# Patient Record
Sex: Female | Born: 1950
Health system: Southern US, Community
[De-identification: ages and names within clinical notes are randomized; demographics above are authoritative.]

## PROBLEM LIST (undated history)

## (undated) DIAGNOSIS — M8580 Other specified disorders of bone density and structure, unspecified site: Secondary | ICD-10-CM

## (undated) DIAGNOSIS — I4891 Unspecified atrial fibrillation: Secondary | ICD-10-CM

## (undated) DIAGNOSIS — N870 Mild cervical dysplasia: Secondary | ICD-10-CM

## (undated) DIAGNOSIS — Z9889 Other specified postprocedural states: Secondary | ICD-10-CM

## (undated) DIAGNOSIS — C4490 Unspecified malignant neoplasm of skin, unspecified: Secondary | ICD-10-CM

## (undated) DIAGNOSIS — M25511 Pain in right shoulder: Secondary | ICD-10-CM

## (undated) DIAGNOSIS — G43909 Migraine, unspecified, not intractable, without status migrainosus: Secondary | ICD-10-CM

## (undated) DIAGNOSIS — E785 Hyperlipidemia, unspecified: Secondary | ICD-10-CM

## (undated) DIAGNOSIS — R739 Hyperglycemia, unspecified: Secondary | ICD-10-CM

## (undated) DIAGNOSIS — M549 Dorsalgia, unspecified: Secondary | ICD-10-CM

## (undated) DIAGNOSIS — R112 Nausea with vomiting, unspecified: Secondary | ICD-10-CM

## (undated) DIAGNOSIS — I4892 Unspecified atrial flutter: Secondary | ICD-10-CM

## (undated) DIAGNOSIS — K635 Polyp of colon: Secondary | ICD-10-CM

## (undated) HISTORY — PX: CHOLECYSTECTOMY: SHX55

## (undated) HISTORY — PX: TUBAL LIGATION: SHX77

## (undated) HISTORY — PX: SHOULDER SURGERY: SHX246

## (undated) HISTORY — DX: Dorsalgia, unspecified: M54.9

## (undated) HISTORY — DX: Hyperglycemia, unspecified: R73.9

## (undated) HISTORY — PX: GYNECOLOGIC CRYOSURGERY: SHX857

## (undated) HISTORY — PX: COSMETIC SURGERY: SHX468

## (undated) HISTORY — DX: Pain in right shoulder: M25.511

## (undated) HISTORY — PX: COLPOSCOPY: SHX161

## (undated) HISTORY — DX: Hyperlipidemia, unspecified: E78.5

## (undated) HISTORY — PX: ABDOMINAL SURGERY: SHX537

## (undated) HISTORY — PX: TONSILLECTOMY AND ADENOIDECTOMY: SHX28

## (undated) HISTORY — DX: Other specified disorders of bone density and structure, unspecified site: M85.80

## (undated) HISTORY — DX: Unspecified malignant neoplasm of skin, unspecified: C44.90

## (undated) HISTORY — DX: Mild cervical dysplasia: N87.0

## (undated) HISTORY — DX: Polyp of colon: K63.5

## (undated) HISTORY — DX: Migraine, unspecified, not intractable, without status migrainosus: G43.909

---

## 1998-02-26 ENCOUNTER — Other Ambulatory Visit: Admission: RE | Admit: 1998-02-26 | Discharge: 1998-02-26 | Payer: Self-pay | Admitting: Obstetrics and Gynecology

## 1998-03-02 ENCOUNTER — Ambulatory Visit (HOSPITAL_COMMUNITY): Admission: RE | Admit: 1998-03-02 | Discharge: 1998-03-02 | Payer: Self-pay | Admitting: Obstetrics and Gynecology

## 1998-09-01 ENCOUNTER — Other Ambulatory Visit: Admission: RE | Admit: 1998-09-01 | Discharge: 1998-09-01 | Payer: Self-pay | Admitting: Obstetrics and Gynecology

## 1999-01-25 ENCOUNTER — Encounter: Payer: Self-pay | Admitting: Family Medicine

## 1999-01-25 ENCOUNTER — Ambulatory Visit (HOSPITAL_COMMUNITY): Admission: RE | Admit: 1999-01-25 | Discharge: 1999-01-25 | Payer: Self-pay | Admitting: Family Medicine

## 1999-04-01 ENCOUNTER — Encounter: Payer: Self-pay | Admitting: Obstetrics and Gynecology

## 1999-04-01 ENCOUNTER — Ambulatory Visit (HOSPITAL_COMMUNITY): Admission: RE | Admit: 1999-04-01 | Discharge: 1999-04-01 | Payer: Self-pay | Admitting: Obstetrics and Gynecology

## 1999-04-01 ENCOUNTER — Encounter (INDEPENDENT_AMBULATORY_CARE_PROVIDER_SITE_OTHER): Payer: Self-pay | Admitting: *Deleted

## 1999-04-01 ENCOUNTER — Other Ambulatory Visit: Admission: RE | Admit: 1999-04-01 | Discharge: 1999-04-01 | Payer: Self-pay | Admitting: Obstetrics and Gynecology

## 1999-06-01 ENCOUNTER — Other Ambulatory Visit: Admission: RE | Admit: 1999-06-01 | Discharge: 1999-06-01 | Payer: Self-pay | Admitting: Obstetrics and Gynecology

## 1999-06-01 ENCOUNTER — Encounter (INDEPENDENT_AMBULATORY_CARE_PROVIDER_SITE_OTHER): Payer: Self-pay | Admitting: Specialist

## 1999-07-08 ENCOUNTER — Encounter (INDEPENDENT_AMBULATORY_CARE_PROVIDER_SITE_OTHER): Payer: Self-pay

## 1999-07-08 ENCOUNTER — Other Ambulatory Visit: Admission: RE | Admit: 1999-07-08 | Discharge: 1999-07-08 | Payer: Self-pay | Admitting: Obstetrics and Gynecology

## 1999-10-08 ENCOUNTER — Other Ambulatory Visit: Admission: RE | Admit: 1999-10-08 | Discharge: 1999-10-08 | Payer: Self-pay | Admitting: Obstetrics and Gynecology

## 1999-12-23 ENCOUNTER — Other Ambulatory Visit: Admission: RE | Admit: 1999-12-23 | Discharge: 1999-12-23 | Payer: Self-pay | Admitting: Obstetrics and Gynecology

## 2000-02-04 ENCOUNTER — Encounter: Payer: Self-pay | Admitting: Obstetrics and Gynecology

## 2000-02-04 ENCOUNTER — Encounter: Admission: RE | Admit: 2000-02-04 | Discharge: 2000-02-04 | Payer: Self-pay | Admitting: Obstetrics and Gynecology

## 2000-04-10 ENCOUNTER — Other Ambulatory Visit: Admission: RE | Admit: 2000-04-10 | Discharge: 2000-04-10 | Payer: Self-pay | Admitting: Obstetrics and Gynecology

## 2000-10-17 HISTORY — PX: VAGINAL HYSTERECTOMY: SUR661

## 2000-10-17 HISTORY — PX: OOPHORECTOMY: SHX86

## 2001-02-14 ENCOUNTER — Encounter: Payer: Self-pay | Admitting: Obstetrics and Gynecology

## 2001-02-14 ENCOUNTER — Ambulatory Visit (HOSPITAL_COMMUNITY): Admission: RE | Admit: 2001-02-14 | Discharge: 2001-02-14 | Payer: Self-pay | Admitting: Obstetrics and Gynecology

## 2001-04-12 ENCOUNTER — Other Ambulatory Visit: Admission: RE | Admit: 2001-04-12 | Discharge: 2001-04-12 | Payer: Self-pay | Admitting: Obstetrics and Gynecology

## 2001-05-08 ENCOUNTER — Encounter (INDEPENDENT_AMBULATORY_CARE_PROVIDER_SITE_OTHER): Payer: Self-pay

## 2001-05-08 ENCOUNTER — Other Ambulatory Visit: Admission: RE | Admit: 2001-05-08 | Discharge: 2001-05-08 | Payer: Self-pay | Admitting: Obstetrics and Gynecology

## 2001-07-02 ENCOUNTER — Ambulatory Visit (HOSPITAL_COMMUNITY): Admission: RE | Admit: 2001-07-02 | Discharge: 2001-07-02 | Payer: Self-pay | Admitting: Obstetrics and Gynecology

## 2001-07-02 ENCOUNTER — Encounter: Payer: Self-pay | Admitting: Obstetrics and Gynecology

## 2001-07-27 ENCOUNTER — Other Ambulatory Visit: Admission: RE | Admit: 2001-07-27 | Discharge: 2001-07-27 | Payer: Self-pay | Admitting: Obstetrics and Gynecology

## 2001-09-17 ENCOUNTER — Inpatient Hospital Stay (HOSPITAL_COMMUNITY): Admission: RE | Admit: 2001-09-17 | Discharge: 2001-09-19 | Payer: Self-pay | Admitting: Obstetrics and Gynecology

## 2001-09-17 ENCOUNTER — Encounter (INDEPENDENT_AMBULATORY_CARE_PROVIDER_SITE_OTHER): Payer: Self-pay | Admitting: Specialist

## 2002-02-27 ENCOUNTER — Ambulatory Visit (HOSPITAL_COMMUNITY): Admission: RE | Admit: 2002-02-27 | Discharge: 2002-02-27 | Payer: Self-pay | Admitting: Obstetrics and Gynecology

## 2002-02-27 ENCOUNTER — Encounter: Payer: Self-pay | Admitting: Obstetrics and Gynecology

## 2002-08-15 ENCOUNTER — Other Ambulatory Visit: Admission: RE | Admit: 2002-08-15 | Discharge: 2002-08-15 | Payer: Self-pay | Admitting: Obstetrics and Gynecology

## 2003-03-05 ENCOUNTER — Ambulatory Visit (HOSPITAL_COMMUNITY): Admission: RE | Admit: 2003-03-05 | Discharge: 2003-03-05 | Payer: Self-pay | Admitting: Obstetrics and Gynecology

## 2003-03-05 ENCOUNTER — Encounter: Payer: Self-pay | Admitting: Obstetrics and Gynecology

## 2003-09-15 ENCOUNTER — Other Ambulatory Visit: Admission: RE | Admit: 2003-09-15 | Discharge: 2003-09-15 | Payer: Self-pay | Admitting: Obstetrics and Gynecology

## 2003-10-18 DIAGNOSIS — N870 Mild cervical dysplasia: Secondary | ICD-10-CM

## 2003-10-18 HISTORY — DX: Mild cervical dysplasia: N87.0

## 2004-03-05 ENCOUNTER — Ambulatory Visit (HOSPITAL_COMMUNITY): Admission: RE | Admit: 2004-03-05 | Discharge: 2004-03-05 | Payer: Self-pay | Admitting: Obstetrics and Gynecology

## 2004-04-29 ENCOUNTER — Ambulatory Visit (HOSPITAL_COMMUNITY): Admission: RE | Admit: 2004-04-29 | Discharge: 2004-04-29 | Payer: Self-pay | Admitting: Orthopedic Surgery

## 2004-09-21 ENCOUNTER — Other Ambulatory Visit: Admission: RE | Admit: 2004-09-21 | Discharge: 2004-09-21 | Payer: Self-pay | Admitting: Obstetrics and Gynecology

## 2005-03-08 ENCOUNTER — Ambulatory Visit (HOSPITAL_COMMUNITY): Admission: RE | Admit: 2005-03-08 | Discharge: 2005-03-08 | Payer: Self-pay | Admitting: Addiction Medicine

## 2005-03-10 ENCOUNTER — Other Ambulatory Visit: Admission: RE | Admit: 2005-03-10 | Discharge: 2005-03-10 | Payer: Self-pay | Admitting: Obstetrics and Gynecology

## 2005-09-22 ENCOUNTER — Other Ambulatory Visit: Admission: RE | Admit: 2005-09-22 | Discharge: 2005-09-22 | Payer: Self-pay | Admitting: Obstetrics and Gynecology

## 2006-03-16 ENCOUNTER — Ambulatory Visit (HOSPITAL_COMMUNITY): Admission: RE | Admit: 2006-03-16 | Discharge: 2006-03-16 | Payer: Self-pay | Admitting: Obstetrics and Gynecology

## 2006-03-28 ENCOUNTER — Other Ambulatory Visit: Admission: RE | Admit: 2006-03-28 | Discharge: 2006-03-28 | Payer: Self-pay | Admitting: Obstetrics and Gynecology

## 2006-10-04 ENCOUNTER — Other Ambulatory Visit: Admission: RE | Admit: 2006-10-04 | Discharge: 2006-10-04 | Payer: Self-pay | Admitting: Obstetrics and Gynecology

## 2007-03-19 ENCOUNTER — Ambulatory Visit (HOSPITAL_COMMUNITY): Admission: RE | Admit: 2007-03-19 | Discharge: 2007-03-19 | Payer: Self-pay | Admitting: Obstetrics and Gynecology

## 2007-03-28 ENCOUNTER — Encounter: Admission: RE | Admit: 2007-03-28 | Discharge: 2007-03-28 | Payer: Self-pay | Admitting: Obstetrics and Gynecology

## 2007-09-27 ENCOUNTER — Encounter: Admission: RE | Admit: 2007-09-27 | Discharge: 2007-09-27 | Payer: Self-pay | Admitting: Obstetrics and Gynecology

## 2007-10-12 ENCOUNTER — Other Ambulatory Visit: Admission: RE | Admit: 2007-10-12 | Discharge: 2007-10-12 | Payer: Self-pay | Admitting: Obstetrics and Gynecology

## 2008-03-28 ENCOUNTER — Encounter: Admission: RE | Admit: 2008-03-28 | Discharge: 2008-03-28 | Payer: Self-pay | Admitting: Obstetrics and Gynecology

## 2008-04-26 ENCOUNTER — Emergency Department (HOSPITAL_COMMUNITY): Admission: EM | Admit: 2008-04-26 | Discharge: 2008-04-26 | Payer: Self-pay | Admitting: Family Medicine

## 2008-09-18 ENCOUNTER — Encounter: Admission: RE | Admit: 2008-09-18 | Discharge: 2008-09-18 | Payer: Self-pay | Admitting: Obstetrics and Gynecology

## 2008-10-14 ENCOUNTER — Other Ambulatory Visit: Admission: RE | Admit: 2008-10-14 | Discharge: 2008-10-14 | Payer: Self-pay | Admitting: Obstetrics and Gynecology

## 2008-10-14 ENCOUNTER — Ambulatory Visit: Payer: Self-pay | Admitting: Obstetrics and Gynecology

## 2008-10-14 ENCOUNTER — Encounter: Payer: Self-pay | Admitting: Obstetrics and Gynecology

## 2009-03-31 ENCOUNTER — Encounter: Admission: RE | Admit: 2009-03-31 | Discharge: 2009-03-31 | Payer: Self-pay | Admitting: Obstetrics and Gynecology

## 2009-10-29 ENCOUNTER — Ambulatory Visit: Payer: Self-pay | Admitting: Obstetrics and Gynecology

## 2009-10-29 ENCOUNTER — Other Ambulatory Visit: Admission: RE | Admit: 2009-10-29 | Discharge: 2009-10-29 | Payer: Self-pay | Admitting: Obstetrics and Gynecology

## 2010-04-05 ENCOUNTER — Encounter: Admission: RE | Admit: 2010-04-05 | Discharge: 2010-04-05 | Payer: Self-pay | Admitting: Obstetrics and Gynecology

## 2010-11-03 ENCOUNTER — Ambulatory Visit
Admission: RE | Admit: 2010-11-03 | Discharge: 2010-11-03 | Payer: Self-pay | Source: Home / Self Care | Attending: Obstetrics and Gynecology | Admitting: Obstetrics and Gynecology

## 2010-11-03 ENCOUNTER — Other Ambulatory Visit
Admission: RE | Admit: 2010-11-03 | Discharge: 2010-11-03 | Payer: Self-pay | Source: Home / Self Care | Admitting: Obstetrics and Gynecology

## 2010-11-03 ENCOUNTER — Other Ambulatory Visit: Payer: Self-pay | Admitting: Obstetrics and Gynecology

## 2011-03-04 NOTE — H&P (Signed)
[facility]  Patient:    Donna Guerra, PAYSON Visit Number: [id number] MRN: [id number]          Service Type: Attending:  Rande Brunt. Eda Paschal, M.D. Dictated by:   Rande Brunt. Eda Paschal, M.D.                           History and Physical  CHIEF COMPLAINT:  Persistent postmenopausal bleeding, pelvic pain, adnexal masses.  HISTORY OF PRESENT ILLNESS:  The patient is 60 year old, gravida 2, para 2, AB0 who enters the hospital for laparoscopic-assisted vaginal hysterectomy, bilateral salpingo-oophorectomy for multiple reasons.  These include persistent pelvic with the presence on ultrasound of large hydrosalpinx.  In addition, she is having recurrent dyspareunia, recurrent post coital bleeding without any obvious etiology of the above, persistent myomas which may be responsible for some of the above, and persistent dysplasia.  As a result of all of the above, she now enters the hospital for laparoscopic-assisted vaginal hysterectomy and bilateral salpingo-oophorectomy.  The patient understands that the surgery may not be able to be accomplished the above way, especially with the previous cesarean sections; and she does understand that she may require an incision.  PAST MEDICAL HISTORY:  Two previous cesarean sections.  History of migraines treated with Amerge.  ALLERGIES:  She is allergic to no drugs.  FAMILY HISTORY:  Mother is hypertensive.  Mother and maternal aunt have had breast cancer.  SOCIAL HISTORY:  She occasionally drinks alcohol.  She is a nonsmoker.  She does use caffeine, and she exercises regularly.  REVIEW OF SYSTEMS:  HEENT: Negative.  Cardiac: Negative.  Respiratory: Negative.  GI: Negative.  GU: Negative.  Neuromuscular: Negative.  Endocrine: Negative.  PHYSICAL EXAMINATION:  GENERAL:  The patient is a well-developed, well-nourished female in no acute distress.  VITAL SIGNS: Blood pressure 120/70, pulse 80 and regular, respirations  16 and unlabored.  She is afebrile.  HEENT:  Within normal limits.  NECK:  Supple.  Trachea is midline.  Thyroid is not enlarged.  LUNGS:  Clear to percussion and auscultation.  HEART:  No thrills, heaves, or murmurs.  BREASTS:  No masses.  ABDOMEN:  Soft without guarding, rebound, or masses.  PELVIC:  External and vaginal is within normal limits.  Cervix is clean.  Pap smear continues to show low-grade dysplasia.  Uterus is enlarged by small myomata 7 to 8-weeks size.  Adnexa reveals tenderness and, on ultrasound, there are bilateral hydrosalpinx present.  RECTAL:  Confirmatory.  EXTREMITIES:  Within normal limits.  ADMISSION IMPRESSION: 1. Pelvic pain. 2. Dyspareunia. 3. Persistent post coital and postmenopausal bleeding, not on hormone    replacement therapy. 4. Myomas. 5. Hydrosalpinx bilaterally. 6. Cervical dysplasia.  PLAN:  Laparoscopic-assisted vaginal hysterectomy and bilateral salpingo-oophorectomy for treatment of all of the above. Dictated by:   Rande Brunt. Eda Paschal, M.D. Attending:  Rande Brunt. Eda Paschal, M.D. DD:  09/17/01 TD:  09/17/01 Job: [id number]
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
## 2011-03-04 NOTE — Op Note (Signed)
[facility]  Patient:    Donna Guerra, Donna Guerra Visit Number: [id number] MRN: [id number]          Service Type: Attending:  Rande Brunt. Eda Paschal, M.D. Dictated by:   Rande Brunt. Eda Paschal, M.D. Proc. Date: 09/17/01                             Operative Report  PREOPERATIVE DIAGNOSES:  Persistent recurrent postmenopausal bleeding and postcoital bleeding, CIN, pelvic pain, leiomyomata uteri, adnexal masses of unknown etiology.  POSTOPERATIVE DIAGNOSES:  Persistent recurrent postmenopausal bleeding and postcoital bleeding, CIN, pelvic pain, leiomyomata uteri, adnexal masses of unknown etiology. Multiple power ovarian cyst bilaterally and pelvic adhesive disease.  OPERATION PERFORMED:  Laparoscopically assisted vaginal hysterectomy, bilateral salpingo-oophorectomy.  SURGEON:  Daniel L. Eda Paschal, M.D.  FIRST ASSISTANT:  Katy Fitch, M.D.  FINDINGS:  At the time of laparoscopy, the multiple cystic areas that had been seen with excrescences were found to be multiple power ovarian cysts. On the right side, the patient had five or six of them and on the left side she had two or three of them. The left adnexa was adherent to the sigmoid colon by adhesive disease. There was at least one small leiomyoma uteri identified, total size of the uterus was 8-9 week size. Pelvic peritoneum was free of any disease other than discussed above. The ovaries themselves appeared normal.  DESCRIPTION OF PROCEDURE:  After adequate general endotracheal anesthesia, the patient was placed in the dorsal lithotomy position, prepped and draped in the usual sterile manner. A Foley catheter was inserted in the patients bladder, a Hulka catheter was inserted in the patients uterus. A pneumoperitoneum was created with a Veress needle using carbon dioxide and the needle was placed subumbilically and 3.5 liters of carbon dioxide were utilized. A 10 mm trocar was then placed. Through this  incision, the laparoscope was placed through the trocar and this was attached to the camera for visualization. Two 5 mm ports were placed in the pelvis on the right and left side, and through this a variety of instruments were placed. The pelvis was visualized and was as noted above. Both ureters could be identified so they could be avoided during the below dissection. Using sharp scissors, the adhesions of the colon to the left adnexa could be freed up without creating any bleeding and without injuring the bowel. With the ureter well identified, the left infundibulopelvic ligament was bipolared and cut. On the right side, the right infundibulopelvic ligament was bipolared and cut after the ureter had been identified. The peritoneum was opened to the round ligament. The round ligaments were bipolared and cut and now the adnexa were fairly free on both sides. An attempt was made to start a bladder flap but the patient had dense adhesions to the site of her two previous cesarean sections and although part of it could be developed, part of it could not because of the scarring and it was felt that it might be developed better vaginally. At this point, the procedure was terminated, the patient was repositioned and the procedure was continued vaginally. A 1:200,000 solution of epinephrine and 0.5% xylocaine was injected around the cervix. A 360 degree incision was made around the cervix. The bladder and the posterior peritoneum were sharply dissected free. The posterior peritoneum was entered with sharp dissection. The vesicouterine fold to peritoneum now was very dense and thick and it was very difficult to find and create a plane.  Dissection continued slowly to be careful not to enter the bladder. At one point, the uterosacral ligaments and the cardinal ligaments were clamped, cut and suture ligated to facilitate the dissection, #1 chromic catgut was utilized. The uterosacral ligaments were  shortened and sutured to the vault laterally for good vault support. At this point, the bladder flap could be advanced even further. The uterine arteries could be clamped, cut and doubly suture ligated with #1 chromic catgut. The bladder flap was now advanced even further and at this point, the vesicouterine fold to peritoneum was opened. The balance of the broad ligament was clamped, cut and suture ligated with #1 chromic catgut and now the uterus, ovaries, and tubes were removed. At one point a myomectomy had been performed on one of the myomas in order to give [place] more room to deliver the uterus but other than this no other morcellation took place. At this point, the bladder was blown up with 300 cc of indigo carmine stained normal saline to be sure there had not been an inadvertent injury to the bladder because of the difficulty of the dissection and the bladder distended well without any evidence of leakage. Copious irrigation was done with Ringers lactate. The cuff was sutured to the posterior peritoneum with a running locking #0 Vicryl. A modified McCalls enterocele prevention suture was placed with 2-0 Vicryl and then the cuff was closed with interrupteds of #0 chromic without any difficulty. The surgeon was then regloved. Attention was next turned above. Care was taken to see if there was any bleeding from any of the above pedicles and there was none. Copious irrigation was done with Ringers lactate. All cul-de-sac fluid was removed. The subumbilical incision was closed through the fascia with #0 Vicryl and the three skin incisions were closed with 4-0 monocryl. Estimated blood loss for the entire procedure was 200 cc with none replaced. The patient tolerated the procedure well and left the operating room in satisfactory condition draining clear urine from her Foley catheter. Dictated by:   Rande Brunt. Eda Paschal, M.D. Attending:  Rande Brunt. Eda Paschal, M.D. DD:  09/17/01 TD:   09/18/01 Job: [id number]

## 2011-03-04 NOTE — Discharge Summary (Signed)
[facility]  Patient:    Donna Guerra, Donna Guerra Visit Number: [id number] MRN: [id number]          Service Type: GYN Location: 4W 0464 01 Attending Physician:  Sharon Mt Dictated by:   Rande Brunt. Eda Paschal, M.D. Admit Date:  09/17/2001 Discharge Date: 09/19/2001                             Discharge Summary  HISTORY OF PRESENT ILLNESS:  Patient is a 60 year old female who was admitted to the hospital with leiomyoma uteri, pelvic pain, persistent postmenopausal bleeding, and adnexal masses of unknown etiology for definitive surgery.  HOSPITAL COURSE:  On the day of admission, she was taken to the operating room and a laparoscopic-assisted vaginal hysterectomy, bilateral salpingo-oophorectomy was performed.  Findings were fibroids, multiple paraovarian cysts, pelvic adhesions.  Postoperatively, she had initial ileus associated with some nausea but, by the second postoperative day, that was cleared.  She was discharged on full liquids, Tylox for pain relief, ambulatory activity, to return to our office in four weeks.  Final pathology report revealed uterus, ovaries, and fallopian tubes with the endometrium showing simple hyperplasia without atypia, the fundus showing adenomyosis, leiomyomata with focal degenerative changes.  The ovaries showed bilateral benign serous cysts.  The fallopian tubes showed multiple benign paratubal cysts.  There was a nodule in the cul de sac that was removed that came back benign adipose tissue with surrounding fibrosis consistent with appendices epiploicae.  CONDITION ON DISCHARGE:  Improved.  DISCHARGE DIAGNOSIS:  Adenomyosis, leiomyomata uteri, benign serous cysts of the ovaries, benign paratubal cysts bilaterally, postmenopausal bleeding, pelvic pain, pelvic adhesive disease.  OPERATION:  Laparoscopic-assisted vaginal hysterectomy, bilateral salpingo-oophorectomy. Dictated by:   Rande Brunt. Eda Paschal,  M.D. Attending Physician:  Sharon Mt DD:  09/29/01 TD:  09/29/01 Job: [id number]

## 2011-03-08 ENCOUNTER — Other Ambulatory Visit: Payer: Self-pay | Admitting: Obstetrics and Gynecology

## 2011-03-08 DIAGNOSIS — Z1231 Encounter for screening mammogram for malignant neoplasm of breast: Secondary | ICD-10-CM

## 2011-04-08 ENCOUNTER — Ambulatory Visit
Admission: RE | Admit: 2011-04-08 | Discharge: 2011-04-08 | Disposition: A | Discharge status: Home / Self Care | Payer: BC Managed Care – PPO | Source: Ambulatory Visit | Attending: Obstetrics and Gynecology | Admitting: Obstetrics and Gynecology

## 2011-04-08 DIAGNOSIS — Z1231 Encounter for screening mammogram for malignant neoplasm of breast: Secondary | ICD-10-CM

## 2011-11-03 ENCOUNTER — Encounter: Payer: Self-pay | Admitting: Gynecology

## 2011-11-03 DIAGNOSIS — N838 Other noninflammatory disorders of ovary, fallopian tube and broad ligament: Secondary | ICD-10-CM | POA: Insufficient documentation

## 2011-11-03 DIAGNOSIS — N800 Endometriosis of uterus: Secondary | ICD-10-CM | POA: Insufficient documentation

## 2011-11-03 DIAGNOSIS — N8003 Adenomyosis of the uterus: Secondary | ICD-10-CM | POA: Insufficient documentation

## 2011-11-03 DIAGNOSIS — D219 Benign neoplasm of connective and other soft tissue, unspecified: Secondary | ICD-10-CM | POA: Insufficient documentation

## 2011-11-03 DIAGNOSIS — M8580 Other specified disorders of bone density and structure, unspecified site: Secondary | ICD-10-CM | POA: Insufficient documentation

## 2011-11-03 DIAGNOSIS — G43909 Migraine, unspecified, not intractable, without status migrainosus: Secondary | ICD-10-CM | POA: Insufficient documentation

## 2011-11-03 DIAGNOSIS — N83209 Unspecified ovarian cyst, unspecified side: Secondary | ICD-10-CM | POA: Insufficient documentation

## 2011-11-03 DIAGNOSIS — N8500 Endometrial hyperplasia, unspecified: Secondary | ICD-10-CM | POA: Insufficient documentation

## 2011-11-09 ENCOUNTER — Ambulatory Visit (INDEPENDENT_AMBULATORY_CARE_PROVIDER_SITE_OTHER): Payer: BC Managed Care – PPO | Admitting: Obstetrics and Gynecology

## 2011-11-09 ENCOUNTER — Encounter: Payer: Self-pay | Admitting: Obstetrics and Gynecology

## 2011-11-09 ENCOUNTER — Other Ambulatory Visit (HOSPITAL_COMMUNITY)
Admission: RE | Admit: 2011-11-09 | Discharge: 2011-11-09 | Disposition: A | Discharge status: Home / Self Care | Payer: BC Managed Care – PPO | Source: Ambulatory Visit | Attending: Obstetrics and Gynecology | Admitting: Obstetrics and Gynecology

## 2011-11-09 VITALS — BP 120/76 | Ht 63.0 in | Wt 124.0 lb

## 2011-11-09 DIAGNOSIS — M899 Disorder of bone, unspecified: Secondary | ICD-10-CM

## 2011-11-09 DIAGNOSIS — M8580 Other specified disorders of bone density and structure, unspecified site: Secondary | ICD-10-CM

## 2011-11-09 DIAGNOSIS — Z01419 Encounter for gynecological examination (general) (routine) without abnormal findings: Secondary | ICD-10-CM | POA: Insufficient documentation

## 2011-11-09 DIAGNOSIS — E7800 Pure hypercholesterolemia, unspecified: Secondary | ICD-10-CM | POA: Insufficient documentation

## 2011-11-09 LAB — URINALYSIS W MICROSCOPIC + REFLEX CULTURE
Casts: NONE SEEN
Crystals: NONE SEEN
Glucose, UA: NEGATIVE mg/dL
Specific Gravity, Urine: 1.03 (ref 1.005–1.030)
pH: 5.5 (ref 5.0–8.0)

## 2011-11-09 MED ORDER — ESTERIFIED ESTROGENS 1.25 MG PO TABS: ORAL_TABLET | ORAL | Status: DC

## 2011-11-09 NOTE — Progress Notes (Signed)
Patient came to see me today for her annual GYN exam. She remains on HRT with good results for her hot flashes. She has used OTC lubricants without success for dyspareunia. She is up-to-date on mammograms. She had one normal bone density but was many years ago. She is having no vaginal bleeding. She is having no pelvic pain. She is having no urinary symptoms.  HEENT: Within normal limits. Kennon Portela present. Neck: No masses. Supraclavicular lymph nodes: Not enlarged. Breasts: Examined in both sitting and lying position. Symmetrical without skin changes or masses. Abdomen: Soft no masses guarding or rebound. No hernias. Pelvic: External within normal limits. BUS within normal limits. Vaginal examination shows fair  estrogen effect, no cystocele enterocele or rectocele. Cervix and uterus absent. Adnexa within normal limits. Rectovaginal confirmatory. Extremities within normal limits.  Assessment: #1. Menopausal symptoms #2. Atrophic vaginitis  Plan: Continue yearly mammograms. Scheduled bone density. Continue HRT. Added vagifem 10 mcg 1 BIW. Discussed estradiol vaginal cream if Vagifem too expensive. Lab work through PCP.

## 2011-11-09 NOTE — Patient Instructions (Signed)
Scheduled bone density

## 2011-12-14 ENCOUNTER — Ambulatory Visit (INDEPENDENT_AMBULATORY_CARE_PROVIDER_SITE_OTHER): Payer: BC Managed Care – PPO

## 2011-12-14 DIAGNOSIS — M8580 Other specified disorders of bone density and structure, unspecified site: Secondary | ICD-10-CM

## 2011-12-14 DIAGNOSIS — M899 Disorder of bone, unspecified: Secondary | ICD-10-CM

## 2011-12-15 ENCOUNTER — Encounter: Payer: Self-pay | Admitting: Obstetrics and Gynecology

## 2011-12-15 ENCOUNTER — Telehealth: Payer: Self-pay | Admitting: *Deleted

## 2011-12-15 MED ORDER — NONFORMULARY OR COMPOUNDED ITEM: Status: DC

## 2011-12-15 NOTE — Telephone Encounter (Signed)
Lm for patient to call back.  She had questions about a cream

## 2011-12-15 NOTE — Telephone Encounter (Signed)
rx called in and lm for patient that they will call when ready.

## 2011-12-15 NOTE — Telephone Encounter (Signed)
Yes. Estradiol vaginal cream 0.02%. 1 mL prefilled applicators. 3 month supply. Use one applicator in vagina 3 times a week.

## 2011-12-15 NOTE — Telephone Encounter (Signed)
Patient states she is ready to try Estradiol Vag cream from [facility].  Other cream was too expensive.  Ok to call in?

## 2012-03-06 ENCOUNTER — Other Ambulatory Visit: Payer: Self-pay | Admitting: Obstetrics and Gynecology

## 2012-03-06 ENCOUNTER — Telehealth: Payer: Self-pay | Admitting: *Deleted

## 2012-03-06 DIAGNOSIS — Z1231 Encounter for screening mammogram for malignant neoplasm of breast: Secondary | ICD-10-CM

## 2012-03-06 NOTE — Telephone Encounter (Signed)
Pt called c/o left breast burning that started last night and continued on and off today. OV offered to pt and pt said she will watch for now and make OV if burning continues.

## 2012-03-09 ENCOUNTER — Telehealth: Payer: Self-pay | Admitting: *Deleted

## 2012-03-09 NOTE — Telephone Encounter (Signed)
Pt called requesting refills on estradiol vaginal cream 0.02% same direction as noted on 12/15/11 with 3 refills. To [facility]

## 2012-04-10 ENCOUNTER — Ambulatory Visit
Admission: RE | Admit: 2012-04-10 | Discharge: 2012-04-10 | Disposition: A | Discharge status: Home / Self Care | Payer: BC Managed Care – PPO | Source: Ambulatory Visit | Attending: Obstetrics and Gynecology | Admitting: Obstetrics and Gynecology

## 2012-04-10 DIAGNOSIS — Z1231 Encounter for screening mammogram for malignant neoplasm of breast: Secondary | ICD-10-CM

## 2012-06-29 ENCOUNTER — Ambulatory Visit: Payer: BC Managed Care – PPO | Admitting: Family Medicine

## 2012-07-06 ENCOUNTER — Ambulatory Visit (INDEPENDENT_AMBULATORY_CARE_PROVIDER_SITE_OTHER): Payer: BC Managed Care – PPO | Admitting: Family Medicine

## 2012-07-06 ENCOUNTER — Other Ambulatory Visit: Payer: Self-pay | Admitting: *Deleted

## 2012-07-06 ENCOUNTER — Encounter: Payer: Self-pay | Admitting: Family Medicine

## 2012-07-06 VITALS — BP 117/76 | HR 80 | Temp 97.6°F | Ht 63.0 in | Wt 124.4 lb

## 2012-07-06 DIAGNOSIS — R52 Pain, unspecified: Secondary | ICD-10-CM

## 2012-07-06 DIAGNOSIS — M899 Disorder of bone, unspecified: Secondary | ICD-10-CM

## 2012-07-06 DIAGNOSIS — M5410 Radiculopathy, site unspecified: Secondary | ICD-10-CM

## 2012-07-06 DIAGNOSIS — M542 Cervicalgia: Secondary | ICD-10-CM

## 2012-07-06 DIAGNOSIS — K635 Polyp of colon: Secondary | ICD-10-CM

## 2012-07-06 DIAGNOSIS — D126 Benign neoplasm of colon, unspecified: Secondary | ICD-10-CM

## 2012-07-06 DIAGNOSIS — Z0000 Encounter for general adult medical examination without abnormal findings: Secondary | ICD-10-CM

## 2012-07-06 DIAGNOSIS — N870 Mild cervical dysplasia: Secondary | ICD-10-CM

## 2012-07-06 DIAGNOSIS — E7800 Pure hypercholesterolemia, unspecified: Secondary | ICD-10-CM

## 2012-07-06 DIAGNOSIS — C4490 Unspecified malignant neoplasm of skin, unspecified: Secondary | ICD-10-CM

## 2012-07-06 DIAGNOSIS — M949 Disorder of cartilage, unspecified: Secondary | ICD-10-CM

## 2012-07-06 DIAGNOSIS — G43909 Migraine, unspecified, not intractable, without status migrainosus: Secondary | ICD-10-CM

## 2012-07-06 DIAGNOSIS — M25519 Pain in unspecified shoulder: Secondary | ICD-10-CM

## 2012-07-06 DIAGNOSIS — M25511 Pain in right shoulder: Secondary | ICD-10-CM

## 2012-07-06 DIAGNOSIS — M8580 Other specified disorders of bone density and structure, unspecified site: Secondary | ICD-10-CM

## 2012-07-06 DIAGNOSIS — IMO0002 Reserved for concepts with insufficient information to code with codable children: Secondary | ICD-10-CM

## 2012-07-06 MED ORDER — IBUPROFEN 200 MG PO CAPS: ORAL_CAPSULE | ORAL | Status: DC

## 2012-07-06 MED ORDER — CYCLOBENZAPRINE HCL 10 MG PO TABS: 10.0000 mg | ORAL_TABLET | Freq: Three times a day (TID) | ORAL | Status: DC | PRN

## 2012-07-06 NOTE — Patient Instructions (Addendum)
Preventive Care for Adults, Female A healthy lifestyle and preventive care can promote health and wellness. Preventive health guidelines for women include the following key practices.  A routine yearly physical is a good way to check with your caregiver about your health and preventive screening. It is a chance to share any concerns and updates on your health, and to receive a thorough exam.   Visit your dentist for a routine exam and preventive care every 6 months. Brush your teeth twice a day and floss once a day. Good oral hygiene prevents tooth decay and gum disease.   The frequency of eye exams is based on your age, health, family medical history, use of contact lenses, and other factors. Follow your caregiver's recommendations for frequency of eye exams.   Eat a healthy diet. Foods like vegetables, fruits, whole grains, low-fat dairy products, and lean protein foods contain the nutrients you need without too many calories. Decrease your intake of foods high in solid fats, added sugars, and salt. Eat the right amount of calories for you.Get information about a proper diet from your caregiver, if necessary.   Regular physical exercise is one of the most important things you can do for your health. Most adults should get at least 150 minutes of moderate-intensity exercise (any activity that increases your heart rate and causes you to sweat) each week. In addition, most adults need muscle-strengthening exercises on 2 or more days a week.   Maintain a healthy weight. The body mass index (BMI) is a screening tool to identify possible weight problems. It provides an estimate of body fat based on height and weight. Your caregiver can help determine your BMI, and can help you achieve or maintain a healthy weight.For adults 20 years and older:   A BMI below 18.5 is considered underweight.   A BMI of 18.5 to 24.9 is normal.   A BMI of 25 to 29.9 is considered overweight.   A BMI of 30 and above is  considered obese.   Maintain normal blood lipids and cholesterol levels by exercising and minimizing your intake of saturated fat. Eat a balanced diet with plenty of fruit and vegetables. Blood tests for lipids and cholesterol should begin at age 20 and be repeated every 5 years. If your lipid or cholesterol levels are high, you are over 50, or you are at high risk for heart disease, you may need your cholesterol levels checked more frequently.Ongoing high lipid and cholesterol levels should be treated with medicines if diet and exercise are not effective.   If you smoke, find out from your caregiver how to quit. If you do not use tobacco, do not start.   If you are pregnant, do not drink alcohol. If you are breastfeeding, be very cautious about drinking alcohol. If you are not pregnant and choose to drink alcohol, do not exceed 1 drink per day. One drink is considered to be 12 ounces (355 mL) of beer, 5 ounces (148 mL) of wine, or 1.5 ounces (44 mL) of liquor.   Avoid use of street drugs. Do not share needles with anyone. Ask for help if you need support or instructions about stopping the use of drugs.   High blood pressure causes heart disease and increases the risk of stroke. Your blood pressure should be checked at least every 1 to 2 years. Ongoing high blood pressure should be treated with medicines if weight loss and exercise are not effective.   If you are 55 to 63   years old, ask your caregiver if you should take aspirin to prevent strokes.   Diabetes screening involves taking a blood sample to check your fasting blood sugar level. This should be done once every 3 years, after age 45, if you are within normal weight and without risk factors for diabetes. Testing should be considered at a younger age or be carried out more frequently if you are overweight and have at least 1 risk factor for diabetes.   Breast cancer screening is essential preventive care for women. You should practice "breast  self-awareness." This means understanding the normal appearance and feel of your breasts and may include breast self-examination. Any changes detected, no matter how small, should be reported to a caregiver. Women in their 20s and 30s should have a clinical breast exam (CBE) by a caregiver as part of a regular health exam every 1 to 3 years. After age 40, women should have a CBE every year. Starting at age 40, women should consider having a mammography (breast X-ray test) every year. Women who have a family history of breast cancer should talk to their caregiver about genetic screening. Women at a high risk of breast cancer should talk to their caregivers about having magnetic resonance imaging (MRI) and a mammography every year.   The Pap test is a screening test for cervical cancer. A Pap test can show cell changes on the cervix that might become cervical cancer if left untreated. A Pap test is a procedure in which cells are obtained and examined from the lower end of the uterus (cervix).   Women should have a Pap test starting at age 21.   Between ages 21 and 29, Pap tests should be repeated every 2 years.   Beginning at age 30, you should have a Pap test every 3 years as long as the past 3 Pap tests have been normal.   Some women have medical problems that increase the chance of getting cervical cancer. Talk to your caregiver about these problems. It is especially important to talk to your caregiver if a new problem develops soon after your last Pap test. In these cases, your caregiver may recommend more frequent screening and Pap tests.   The above recommendations are the same for women who have or have not gotten the vaccine for human papillomavirus (HPV).   If you had a hysterectomy for a problem that was not cancer or a condition that could lead to cancer, then you no longer need Pap tests. Even if you no longer need a Pap test, a regular exam is a good idea to make sure no other problems are  starting.   If you are between ages 65 and 70, and you have had normal Pap tests going back 10 years, you no longer need Pap tests. Even if you no longer need a Pap test, a regular exam is a good idea to make sure no other problems are starting.   If you have had past treatment for cervical cancer or a condition that could lead to cancer, you need Pap tests and screening for cancer for at least 20 years after your treatment.   If Pap tests have been discontinued, risk factors (such as a new sexual partner) need to be reassessed to determine if screening should be resumed.   The HPV test is an additional test that may be used for cervical cancer screening. The HPV test looks for the virus that can cause the cell changes on the cervix.   The cells collected during the Pap test can be tested for HPV. The HPV test could be used to screen women aged 30 years and older, and should be used in women of any age who have unclear Pap test results. After the age of 30, women should have HPV testing at the same frequency as a Pap test.   Colorectal cancer can be detected and often prevented. Most routine colorectal cancer screening begins at the age of 50 and continues through age 75. However, your caregiver may recommend screening at an earlier age if you have risk factors for colon cancer. On a yearly basis, your caregiver may provide home test kits to check for hidden blood in the stool. Use of a small camera at the end of a tube, to directly examine the colon (sigmoidoscopy or colonoscopy), can detect the earliest forms of colorectal cancer. Talk to your caregiver about this at age 50, when routine screening begins. Direct examination of the colon should be repeated every 5 to 10 years through age 75, unless early forms of pre-cancerous polyps or small growths are found.   Hepatitis C blood testing is recommended for all people born from 1945 through 1965 and any individual with known risks for hepatitis C.    Practice safe sex. Use condoms and avoid high-risk sexual practices to reduce the spread of sexually transmitted infections (STIs). STIs include gonorrhea, chlamydia, syphilis, trichomonas, herpes, HPV, and human immunodeficiency virus (HIV). Herpes, HIV, and HPV are viral illnesses that have no cure. They can result in disability, cancer, and death. Sexually active women aged 25 and younger should be checked for chlamydia. Older women with new or multiple partners should also be tested for chlamydia. Testing for other STIs is recommended if you are sexually active and at increased risk.   Osteoporosis is a disease in which the bones lose minerals and strength with aging. This can result in serious bone fractures. The risk of osteoporosis can be identified using a bone density scan. Women ages 65 and over and women at risk for fractures or osteoporosis should discuss screening with their caregivers. Ask your caregiver whether you should take a calcium supplement or vitamin D to reduce the rate of osteoporosis.   Menopause can be associated with physical symptoms and risks. Hormone replacement therapy is available to decrease symptoms and risks. You should talk to your caregiver about whether hormone replacement therapy is right for you.   Use sunscreen with sun protection factor (SPF) of 30 or more. Apply sunscreen liberally and repeatedly throughout the day. You should seek shade when your shadow is shorter than you. Protect yourself by wearing long sleeves, pants, a wide-brimmed hat, and sunglasses year round, whenever you are outdoors.   Once a month, do a whole body skin exam, using a mirror to look at the skin on your back. Notify your caregiver of new moles, moles that have irregular borders, moles that are larger than a pencil eraser, or moles that have changed in shape or color.   Stay current with required immunizations.   Influenza. You need a dose every fall (or winter). The composition of  the flu vaccine changes each year, so being vaccinated once is not enough.   Pneumococcal polysaccharide. You need 1 to 2 doses if you smoke cigarettes or if you have certain chronic medical conditions. You need 1 dose at age 65 (or older) if you have never been vaccinated.   Tetanus, diphtheria, pertussis (Tdap, Td). Get 1 dose of   Tdap vaccine if you are younger than age 65, are over 65 and have contact with an infant, are a healthcare worker, are pregnant, or simply want to be protected from whooping cough. After that, you need a Td booster dose every 10 years. Consult your caregiver if you have not had at least 3 tetanus and diphtheria-containing shots sometime in your life or have a deep or dirty wound.   HPV. You need this vaccine if you are a woman age 26 or younger. The vaccine is given in 3 doses over 6 months.   Measles, mumps, rubella (MMR). You need at least 1 dose of MMR if you were born in 1957 or later. You may also need a second dose.   Meningococcal. If you are age 19 to 21 and a first-year college student living in a residence hall, or have one of several medical conditions, you need to get vaccinated against meningococcal disease. You may also need additional booster doses.   Zoster (shingles). If you are age 60 or older, you should get this vaccine.   Varicella (chickenpox). If you have never had chickenpox or you were vaccinated but received only 1 dose, talk to your caregiver to find out if you need this vaccine.   Hepatitis A. You need this vaccine if you have a specific risk factor for hepatitis A virus infection or you simply wish to be protected from this disease. The vaccine is usually given as 2 doses, 6 to 18 months apart.   Hepatitis B. You need this vaccine if you have a specific risk factor for hepatitis B virus infection or you simply wish to be protected from this disease. The vaccine is given in 3 doses, usually over 6 months.  Preventive Services /  Frequency Ages 19 to 39  Blood pressure check.** / Every 1 to 2 years.   Lipid and cholesterol check.** / Every 5 years beginning at age 20.   Clinical breast exam.** / Every 3 years for women in their 20s and 30s.   Pap test.** / Every 2 years from ages 21 through 29. Every 3 years starting at age 30 through age 65 or 70 with a history of 3 consecutive normal Pap tests.   HPV screening.** / Every 3 years from ages 30 through ages 65 to 70 with a history of 3 consecutive normal Pap tests.   Hepatitis C blood test.** / For any individual with known risks for hepatitis C.   Skin self-exam. / Monthly.   Influenza immunization.** / Every year.   Pneumococcal polysaccharide immunization.** / 1 to 2 doses if you smoke cigarettes or if you have certain chronic medical conditions.   Tetanus, diphtheria, pertussis (Tdap, Td) immunization. / A one-time dose of Tdap vaccine. After that, you need a Td booster dose every 10 years.   HPV immunization. / 3 doses over 6 months, if you are 26 and younger.   Measles, mumps, rubella (MMR) immunization. / You need at least 1 dose of MMR if you were born in 1957 or later. You may also need a second dose.   Meningococcal immunization. / 1 dose if you are age 19 to 21 and a first-year college student living in a residence hall, or have one of several medical conditions, you need to get vaccinated against meningococcal disease. You may also need additional booster doses.   Varicella immunization.** / Consult your caregiver.   Hepatitis A immunization.** / Consult your caregiver. 2 doses, 6 to 18 months   apart.   Hepatitis B immunization.** / Consult your caregiver. 3 doses usually over 6 months.  Ages 40 to 64  Blood pressure check.** / Every 1 to 2 years.   Lipid and cholesterol check.** / Every 5 years beginning at age 20.   Clinical breast exam.** / Every year after age 40.   Mammogram.** / Every year beginning at age 40 and continuing for as  long as you are in good health. Consult with your caregiver.   Pap test.** / Every 3 years starting at age 30 through age 65 or 70 with a history of 3 consecutive normal Pap tests.   HPV screening.** / Every 3 years from ages 30 through ages 65 to 70 with a history of 3 consecutive normal Pap tests.   Fecal occult blood test (FOBT) of stool. / Every year beginning at age 50 and continuing until age 75. You may not need to do this test if you get a colonoscopy every 10 years.   Flexible sigmoidoscopy or colonoscopy.** / Every 5 years for a flexible sigmoidoscopy or every 10 years for a colonoscopy beginning at age 50 and continuing until age 75.   Hepatitis C blood test.** / For all people born from 1945 through 1965 and any individual with known risks for hepatitis C.   Skin self-exam. / Monthly.   Influenza immunization.** / Every year.   Pneumococcal polysaccharide immunization.** / 1 to 2 doses if you smoke cigarettes or if you have certain chronic medical conditions.   Tetanus, diphtheria, pertussis (Tdap, Td) immunization.** / A one-time dose of Tdap vaccine. After that, you need a Td booster dose every 10 years.   Measles, mumps, rubella (MMR) immunization. / You need at least 1 dose of MMR if you were born in 1957 or later. You may also need a second dose.   Varicella immunization.** / Consult your caregiver.   Meningococcal immunization.** / Consult your caregiver.   Hepatitis A immunization.** / Consult your caregiver. 2 doses, 6 to 18 months apart.   Hepatitis B immunization.** / Consult your caregiver. 3 doses, usually over 6 months.  Ages 65 and over  Blood pressure check.** / Every 1 to 2 years.   Lipid and cholesterol check.** / Every 5 years beginning at age 20.   Clinical breast exam.** / Every year after age 40.   Mammogram.** / Every year beginning at age 40 and continuing for as long as you are in good health. Consult with your caregiver.   Pap test.** /  Every 3 years starting at age 30 through age 65 or 70 with a 3 consecutive normal Pap tests. Testing can be stopped between 65 and 70 with 3 consecutive normal Pap tests and no abnormal Pap or HPV tests in the past 10 years.   HPV screening.** / Every 3 years from ages 30 through ages 65 or 70 with a history of 3 consecutive normal Pap tests. Testing can be stopped between 65 and 70 with 3 consecutive normal Pap tests and no abnormal Pap or HPV tests in the past 10 years.   Fecal occult blood test (FOBT) of stool. / Every year beginning at age 50 and continuing until age 75. You may not need to do this test if you get a colonoscopy every 10 years.   Flexible sigmoidoscopy or colonoscopy.** / Every 5 years for a flexible sigmoidoscopy or every 10 years for a colonoscopy beginning at age 50 and continuing until age 75.   Hepatitis   C blood test.** / For all people born from [birth year] through 1965 and any individual with known risks for hepatitis C.   Osteoporosis screening.** / A one-time screening for women ages 24 and over and women at risk for fractures or osteoporosis.   Skin self-exam. / Monthly.   Influenza immunization.** / Every year.   Pneumococcal polysaccharide immunization.** / 1 dose at age 93 (or older) if you have never been vaccinated.   Tetanus, diphtheria, pertussis (Tdap, Td) immunization. / A one-time dose of Tdap vaccine if you are over 65 and have contact with an infant, are a Research scientist (physical sciences), or simply want to be protected from whooping cough. After that, you need a Td booster dose every 10 years.   Varicella immunization.** / Consult your caregiver.   Meningococcal immunization.** / Consult your caregiver.   Hepatitis A immunization.** / Consult your caregiver. 2 doses, 6 to 18 months apart.   Hepatitis B immunization.** / Check with your caregiver. 3 doses, usually over 6 months.  ** Family history and personal history of risk and conditions may change your caregiver's  recommendations. Document Released: 11/29/2001 Document Revised: 09/22/2011 Document Reviewed: 02/28/2011 [facility] Patient Information 2012 [place], [place].  MegaRed cap daily by [employer]

## 2012-07-06 NOTE — Progress Notes (Signed)
[place] to retail pharmacy per request from [employer]; MD informed/SLS

## 2012-07-08 ENCOUNTER — Encounter: Payer: Self-pay | Admitting: Family Medicine

## 2012-07-08 DIAGNOSIS — C439 Malignant melanoma of skin, unspecified: Secondary | ICD-10-CM | POA: Insufficient documentation

## 2012-07-08 DIAGNOSIS — M25511 Pain in right shoulder: Secondary | ICD-10-CM | POA: Insufficient documentation

## 2012-07-08 DIAGNOSIS — Z0000 Encounter for general adult medical examination without abnormal findings: Secondary | ICD-10-CM | POA: Insufficient documentation

## 2012-07-08 DIAGNOSIS — C4490 Unspecified malignant neoplasm of skin, unspecified: Secondary | ICD-10-CM

## 2012-07-08 DIAGNOSIS — K635 Polyp of colon: Secondary | ICD-10-CM | POA: Insufficient documentation

## 2012-07-08 HISTORY — DX: Pain in right shoulder: M25.511

## 2012-07-08 HISTORY — DX: Polyp of colon: K63.5

## 2012-07-08 HISTORY — DX: Unspecified malignant neoplasm of skin, unspecified: C44.90

## 2012-07-08 NOTE — Assessment & Plan Note (Signed)
She has been seeing chiropractic but pain in shoulder persists. Denies any injuries, will proceed with imaging and referral,started on a muscle relaxer qhs and NSAIDS. Try moist heat.

## 2012-07-08 NOTE — Assessment & Plan Note (Signed)
Continue regular exercise, try Citracal bid and vitamin d supplements, check levels with labs

## 2012-07-08 NOTE — Progress Notes (Signed)
Patient ID: Donna Guerra, female   DOB: 1951-09-09, 61 y.o.   MRN: [id number] Donna Guerra [id number] 04-26-1951 07/08/2012      Progress Note New Patient  Subjective  Chief Complaint  Chief Complaint  Patient presents with  . Establish Care    new patient    HPI  Patient is a 61 year old Caucasian female who is in today for new patient appointment. She struggling with some right shoulder pain but denies any injury. She is seeing a chiropractor but not had any good results. Has been using some NSAIDs with minimal improvement. She has a migraine roughly 3 times a month and her current medications are helpful. No recent fevers, chills, chest pain, palpitations, shortness of breath, GI or GU complaints  Past Medical History  Diagnosis Date  . Endometrial hyperplasia   . Adenomyosis   . Fibroid   . Ovarian cyst   . CIN I (cervical intraepithelial neoplasia I)   . Osteopenia   . Cyst of fallopian tube     Bilateral paratubal cysts  . Migraines   . Elevated cholesterol   . Right shoulder pain 07/08/2012  . Colon polyps 07/08/2012  . Skin cancer 07/08/2012  . Preventative health care 07/08/2012    Dr Dianne Dun, OB/GYN  sees dermatology for skin    Past Surgical History  Procedure Date  . Cholecystectomy   . Tubal ligation   . Oophorectomy 2002    BSO  . Shoulder surgery     right rotator cuff repair  . Colposcopy   . Gynecologic cryosurgery   . Cesarean section 1978 and 1981  . Tonsillectomy and adenoidectomy     age 1  . Vaginal hysterectomy 2002    [facility] BSO    Family History  Problem Relation Age of Onset  . Hypertension Mother   . Breast cancer Mother 68  . Cancer Mother 54    breast  . Breast cancer Maternal Aunt   . Cancer Maternal Aunt     breast  . Breast cancer Maternal Aunt   . Cancer Maternal Aunt     breast  . Migraines Father   . Migraines Sister   . Diabetes Maternal Grandmother     History   Social History  . Marital Status: Married   Spouse Name: N/A    Number of Children: N/A  . Years of Education: N/A   Occupational History  . Not on file.   Social History Main Topics  . Smoking status: Never Smoker   . Smokeless tobacco: Never Used  . Alcohol Use: Yes     occasionally  . Drug Use: No  . Sexually Active: Yes -- Female partner(s)    Birth Control/ Protection: Surgical   Other Topics Concern  . Not on file   Social History Narrative  . No narrative on file    Current Outpatient Prescriptions on File Prior to Visit  Medication Sig Dispense Refill  . atorvastatin (LIPITOR) 40 MG tablet Take 40 mg by mouth daily.      . Cholecalciferol (VITAMIN D PO) Take 2,000 Units by mouth.      . Esterified Estrogens (MENEST) 1.25 MG TABS One daily  30 each  12  . fish oil-omega-3 fatty acids 1000 MG capsule Take 1 g by mouth daily.      . SUMAtriptan (IMITREX) 100 MG tablet Take 100 mg by mouth as needed.        No Known Allergies  Review of Systems  Review of Systems  Constitutional: Negative for fever, chills and malaise/fatigue.  HENT: Negative for hearing loss, nosebleeds and congestion.   Eyes: Negative for discharge.  Respiratory: Negative for cough, sputum production, shortness of breath and wheezing.   Cardiovascular: Negative for chest pain, palpitations and leg swelling.  Gastrointestinal: Negative for heartburn, nausea, vomiting, abdominal pain, diarrhea, constipation and blood in stool.  Genitourinary: Negative for dysuria, urgency, frequency and hematuria.  Musculoskeletal: Positive for joint pain. Negative for myalgias, back pain and falls.  Skin: Negative for rash.  Neurological: Positive for headaches. Negative for dizziness, tremors, sensory change, focal weakness, loss of consciousness and weakness.  Endo/Heme/Allergies: Negative for polydipsia. Does not bruise/bleed easily.  Psychiatric/Behavioral: Negative for depression and suicidal ideas. The patient is not nervous/anxious and does not have  insomnia.     Objective  BP 117/76  Pulse 80  Temp 97.6 F (36.4 C) (Temporal)  Ht 5\' 3"  (1.6 m)  Wt 124 lb 6.4 oz (56.427 kg)  BMI 22.04 kg/m2  SpO2 99%  Physical Exam  .Physical Exam  Constitutional: She is oriented to person, place, and time and well-developed, well-nourished, and in no distress. No distress.  HENT:  Head: Normocephalic and atraumatic.  Right Ear: External ear normal.  Left Ear: External ear normal.  Nose: Nose normal.  Mouth/Throat: Oropharynx is clear and moist. No oropharyngeal exudate.  Eyes: Conjunctivae normal are normal. Pupils are equal, round, and reactive to light. Right eye exhibits no discharge. Left eye exhibits no discharge. No scleral icterus.  Neck: Normal range of motion. Neck supple. No thyromegaly present.  Cardiovascular: Normal rate, regular rhythm, normal heart sounds and intact distal pulses.   No murmur heard. Pulmonary/Chest: Effort normal and breath sounds normal. No respiratory distress. She has no wheezes. She has no rales.  Abdominal: Soft. Bowel sounds are normal. She exhibits no distension and no mass. There is no tenderness.  Musculoskeletal: Normal range of motion. She exhibits no edema and no tenderness.  Lymphadenopathy:    She has no cervical adenopathy.  Neurological: She is alert and oriented to person, place, and time. She has normal reflexes. No cranial nerve deficit. Coordination normal.  Skin: Skin is warm and dry. No rash noted. She is not diaphoretic.  Psychiatric: Mood, memory and affect normal.       Assessment & Plan  Colon polyps Patient reports polyp was precancer and they have recommended she have a repat colonoscopy in 5 years  Elevated cholesterol Avoid trans fats and optain old labs.   Osteopenia Continue regular exercise, try Citracal bid and vitamin d supplements, check levels with labs  Right shoulder pain She has been seeing chiropractic but pain in shoulder persists. Denies any injuries,  will proceed with imaging and referral,started on a muscle relaxer qhs and NSAIDS. Try moist heat.  Skin cancer Unsure of type, patient will continue with dermatology and let us know which type she has had. Use sunscreen.  Migraines Tolerably controlled by Imitrex encouraged to consider a daily preventative, avoid alcohol and caffeine

## 2012-07-08 NOTE — Assessment & Plan Note (Signed)
Patient reports polyp was precancer and they have recommended she have a repat colonoscopy in 5 years

## 2012-07-08 NOTE — Assessment & Plan Note (Signed)
Avoid trans fats and optain old labs.

## 2012-07-08 NOTE — Assessment & Plan Note (Signed)
Tolerably controlled by Imitrex encouraged to consider a daily preventative, avoid alcohol and caffeine

## 2012-07-08 NOTE — Assessment & Plan Note (Signed)
Unsure of type, patient will continue with dermatology and let us know which type she has had. Use sunscreen.

## 2012-07-09 ENCOUNTER — Telehealth: Payer: Self-pay | Admitting: Family Medicine

## 2012-07-09 NOTE — Telephone Encounter (Signed)
Patient can try propranolol 10 mg po bid, #60 and then come in in 1-2 weeks to check bp and pulse and consideration of increasing the dosing if no concerns and partially helpful.

## 2012-07-09 NOTE — Telephone Encounter (Signed)
Patient would like to try Rx that was mentioned in her last OV. She said that it is supposed to be something you can take daily to help prevent migraines.

## 2012-07-09 NOTE — Telephone Encounter (Signed)
Please advise 

## 2012-07-10 MED ORDER — PROPRANOLOL HCL 10 MG PO TABS: 10.0000 mg | ORAL_TABLET | Freq: Two times a day (BID) | ORAL | Status: DC

## 2012-07-10 NOTE — Telephone Encounter (Signed)
Left a detailed message on patients voicemail. RX also sent to pharmacy

## 2012-08-07 ENCOUNTER — Ambulatory Visit: Payer: BC Managed Care – PPO

## 2012-08-07 DIAGNOSIS — Z23 Encounter for immunization: Secondary | ICD-10-CM

## 2012-08-07 MED ORDER — ZOSTER VACCINE LIVE 19400 UNT/0.65ML ~~LOC~~ SOLR: 0.6500 mL | Freq: Once | SUBCUTANEOUS | Status: DC

## 2012-08-07 NOTE — Progress Notes (Signed)
  Subjective:    Patient ID: Donna Guerra, female    DOB: 1950-11-05, 61 y.o.   MRN: [id number]  HPI    Review of Systems     Objective:   Physical Exam        Assessment & Plan:  Patient came in for a Zostavax injection. Pt tolerated injection well. Pt also signed a waiver form.

## 2012-08-29 ENCOUNTER — Encounter: Payer: Self-pay | Admitting: Family Medicine

## 2012-08-29 ENCOUNTER — Ambulatory Visit (INDEPENDENT_AMBULATORY_CARE_PROVIDER_SITE_OTHER): Payer: BC Managed Care – PPO | Admitting: Family Medicine

## 2012-08-29 VITALS — BP 134/81 | HR 80 | Temp 97.2°F | Ht 63.0 in | Wt 122.0 lb

## 2012-08-29 DIAGNOSIS — Z01818 Encounter for other preprocedural examination: Secondary | ICD-10-CM | POA: Insufficient documentation

## 2012-08-29 DIAGNOSIS — E785 Hyperlipidemia, unspecified: Secondary | ICD-10-CM

## 2012-08-29 HISTORY — DX: Hyperlipidemia, unspecified: E78.5

## 2012-08-29 LAB — RENAL FUNCTION PANEL
Albumin: 3.9 g/dL (ref 3.5–5.2)
Calcium: 9.6 mg/dL (ref 8.4–10.5)
Creatinine, Ser: 0.7 mg/dL (ref 0.4–1.2)
Glucose, Bld: 86 mg/dL (ref 70–99)
Phosphorus: 3.4 mg/dL (ref 2.3–4.6)
Sodium: 137 mEq/L (ref 135–145)

## 2012-08-29 LAB — CBC
HCT: 41.9 % (ref 36.0–46.0)
MCHC: 32.7 g/dL (ref 30.0–36.0)
MCV: 99.7 fl (ref 78.0–100.0)
RBC: 4.21 Mil/uL (ref 3.87–5.11)

## 2012-08-29 LAB — HEPATIC FUNCTION PANEL
ALT: 20 U/L (ref 0–35)
Total Protein: 7.2 g/dL (ref 6.0–8.3)

## 2012-08-29 NOTE — Progress Notes (Signed)
Patient ID: Donna Guerra, female   DOB: January 26, 1951, 61 y.o.   MRN: [id number] ALIYAHNA BURCK [id number] Mar 31, 1951 08/29/2012      Progress Note New Patient  Subjective  Chief Complaint  Chief Complaint  Patient presents with  . surgical clearance    HPI  Patient is a 61 year old Caucasian female who is in today for preop clearance. She is scheduled to have a plastic surgery procedure, a tummy Tuck next week and is anxious to proceed. She reports her health has been good and she offers no acute complaints. She's not had any recent illness, fevers, chills, headache, chest pain, palpitations, shortness of breath, GI or GU complaints since she was last seen.  Past Medical History  Diagnosis Date  . Endometrial hyperplasia   . Adenomyosis   . Fibroid   . Ovarian cyst   . CIN I (cervical intraepithelial neoplasia I)   . Osteopenia   . Cyst of fallopian tube     Bilateral paratubal cysts  . Migraines   . Elevated cholesterol   . Right shoulder pain 07/08/2012  . Colon polyps 07/08/2012  . Skin cancer 07/08/2012  . Preventative health care 07/08/2012    Dr Dianne Dun, OB/GYN  sees dermatology for skin  . Hyperlipidemia 08/29/2012    Past Surgical History  Procedure Date  . Cholecystectomy   . Tubal ligation   . Oophorectomy 2002    BSO  . Shoulder surgery     right rotator cuff repair  . Colposcopy   . Gynecologic cryosurgery   . Cesarean section 1978 and 1981  . Tonsillectomy and adenoidectomy     age 71  . Vaginal hysterectomy 2002    [facility] BSO    Family History  Problem Relation Age of Onset  . Hypertension Mother   . Breast cancer Mother 69  . Cancer Mother 43    breast  . Breast cancer Maternal Aunt   . Cancer Maternal Aunt     breast  . Breast cancer Maternal Aunt   . Cancer Maternal Aunt     breast  . Migraines Father   . Migraines Sister   . Diabetes Maternal Grandmother     History   Social History  . Marital Status: Married    Spouse Name: N/A     Number of Children: N/A  . Years of Education: N/A   Occupational History  . Not on file.   Social History Main Topics  . Smoking status: Never Smoker   . Smokeless tobacco: Never Used  . Alcohol Use: Yes     Comment: occasionally  . Drug Use: No  . Sexually Active: Yes -- Female partner(s)    Birth Control/ Protection: Surgical   Other Topics Concern  . Not on file   Social History Narrative  . No narrative on file    Current Outpatient Prescriptions on File Prior to Visit  Medication Sig Dispense Refill  . atorvastatin (LIPITOR) 40 MG tablet Take 40 mg by mouth daily.      . Cholecalciferol (VITAMIN D PO) Take 2,000 Units by mouth.      . Esterified Estrogens (MENEST) 1.25 MG TABS One daily  30 each  12  . SUMAtriptan (IMITREX) 100 MG tablet Take 100 mg by mouth as needed.      . fish oil-omega-3 fatty acids 1000 MG capsule Take 1 g by mouth daily.        No Known Allergies  Review of Systems  Review of  Systems  Constitutional: Negative for fever, chills and malaise/fatigue.  HENT: Negative for hearing loss, nosebleeds and congestion.   Eyes: Negative for discharge.  Respiratory: Negative for cough, sputum production, shortness of breath and wheezing.   Cardiovascular: Negative for chest pain, palpitations and leg swelling.  Gastrointestinal: Negative for heartburn, nausea, vomiting, abdominal pain, diarrhea, constipation and blood in stool.  Genitourinary: Negative for dysuria, urgency, frequency and hematuria.  Musculoskeletal: Negative for myalgias, back pain and falls.  Skin: Negative for rash.  Neurological: Negative for dizziness, tremors, sensory change, focal weakness, loss of consciousness, weakness and headaches.  Endo/Heme/Allergies: Negative for polydipsia. Does not bruise/bleed easily.  Psychiatric/Behavioral: Negative for depression and suicidal ideas. The patient is not nervous/anxious and does not have insomnia.     Objective  BP 134/81  Pulse  80  Temp 97.2 F (36.2 C) (Temporal)  Ht 5\' 3"  (1.6 m)  Wt 122 lb (55.339 kg)  BMI 21.61 kg/m2  SpO2 100%  Physical Exam  Physical Exam  Constitutional: She is oriented to person, place, and time and well-developed, well-nourished, and in no distress. No distress.  HENT:  Head: Normocephalic and atraumatic.  Right Ear: External ear normal.  Left Ear: External ear normal.  Nose: Nose normal.  Mouth/Throat: Oropharynx is clear and moist. No oropharyngeal exudate.  Eyes: Conjunctivae normal are normal. Pupils are equal, round, and reactive to light. Right eye exhibits no discharge. Left eye exhibits no discharge. No scleral icterus.  Neck: Normal range of motion. Neck supple. No thyromegaly present.  Cardiovascular: Normal rate, regular rhythm, normal heart sounds and intact distal pulses.   No murmur heard. Pulmonary/Chest: Effort normal and breath sounds normal. No respiratory distress. She has no wheezes. She has no rales.  Abdominal: Soft. Bowel sounds are normal. She exhibits no distension and no mass. There is no tenderness.  Musculoskeletal: Normal range of motion. She exhibits no edema and no tenderness.  Lymphadenopathy:    She has no cervical adenopathy.  Neurological: She is alert and oriented to person, place, and time. She has normal reflexes. No cranial nerve deficit. Coordination normal.  Skin: Skin is warm and dry. No rash noted. She is not diaphoretic.  Psychiatric: Mood, memory and affect normal.       Assessment & Plan  Preop examination Patient is scheduled for plastic surgery/"tummy tuck" next Monday 09/03/12 and they are requesting an EKG which is WNL today. A CBC, Renal and Hepatic are also run today and all are within normal limits. Will clear her surgically once these are available. No concerns identified on exam today  Hyperlipidemia Tolerating Atorvastatin, will request last labs again. Reevaluate next visit.

## 2012-08-29 NOTE — Assessment & Plan Note (Signed)
Tolerating Atorvastatin, will request last labs again. Reevaluate next visit.

## 2012-08-29 NOTE — Assessment & Plan Note (Addendum)
Patient is scheduled for plastic surgery/"tummy tuck" next Monday 09/03/12 and they are requesting an EKG which is WNL today. A CBC, Renal and Hepatic are also run today and all are within normal limits. Will clear Donna Guerra surgically once these are available. No concerns identified on exam today

## 2012-08-29 NOTE — Patient Instructions (Addendum)
Hypertriglyceridemia  Diet for High blood levels of Triglycerides Most fats in food are triglycerides. Triglycerides in your blood are stored as fat in your body. High levels of triglycerides in your blood may put you at a greater risk for heart disease and stroke.  Normal triglyceride levels are less than 150 mg/dL. Borderline high levels are 150-199 mg/dl. High levels are 200 - 499 mg/dL, and very high triglyceride levels are greater than 500 mg/dL. The decision to treat high triglycerides is generally based on the level. For people with borderline or high triglyceride levels, treatment includes weight loss and exercise. Drugs are recommended for people with very high triglyceride levels. Many people who need treatment for high triglyceride levels have metabolic syndrome. This syndrome is a collection of disorders that often include: insulin resistance, high blood pressure, blood clotting problems, high cholesterol and triglycerides. TESTING PROCEDURE FOR TRIGLYCERIDES  You should not eat 4 hours before getting your triglycerides measured. The normal range of triglycerides is between 10 and 250 milligrams per deciliter (mg/dl). Some people may have extreme levels (1000 or above), but your triglyceride level may be too high if it is above 150 mg/dl, depending on what other risk factors you have for heart disease.  People with high blood triglycerides may also have high blood cholesterol levels. If you have high blood cholesterol as well as high blood triglycerides, your risk for heart disease is probably greater than if you only had high triglycerides. High blood cholesterol is one of the main risk factors for heart disease. CHANGING YOUR DIET  Your weight can affect your blood triglyceride level. If you are more than 20% above your ideal body weight, you may be able to lower your blood triglycerides by losing weight. Eating less and exercising regularly is the best way to combat this. Fat provides more  calories than any other food. The best way to lose weight is to eat less fat. Only 30% of your total calories should come from fat. Less than 7% of your diet should come from saturated fat. A diet low in fat and saturated fat is the same as a diet to decrease blood cholesterol. By eating a diet lower in fat, you may lose weight, lower your blood cholesterol, and lower your blood triglyceride level.  Eating a diet low in fat, especially saturated fat, may also help you lower your blood triglyceride level. Ask your dietitian to help you figure how much fat you can eat based on the number of calories your caregiver has prescribed for you.  Exercise, in addition to helping with weight loss may also help lower triglyceride levels.   Alcohol can increase blood triglycerides. You may need to stop drinking alcoholic beverages.  Too much carbohydrate in your diet may also increase your blood triglycerides. Some complex carbohydrates are necessary in your diet. These may include bread, rice, potatoes, other starchy vegetables and cereals.  Reduce "simple" carbohydrates. These may include pure sugars, candy, honey, and jelly without losing other nutrients. If you have the kind of high blood triglycerides that is affected by the amount of carbohydrates in your diet, you will need to eat less sugar and less high-sugar foods. Your caregiver can help you with this.  Adding 2-4 grams of fish oil (EPA+ DHA) may also help lower triglycerides. Speak with your caregiver before adding any supplements to your regimen. Following the Diet  Maintain your ideal weight. Your caregivers can help you with a diet. Generally, eating less food and getting more   exercise will help you lose weight. Joining a weight control group may also help. Ask your caregivers for a good weight control group in your area.  Eat low-fat foods instead of high-fat foods. This can help you lose weight too.  These foods are lower in fat. Eat MORE of these:    Dried beans, peas, and lentils.  Egg whites.  Low-fat cottage cheese.  Fish.  Lean cuts of meat, such as round, sirloin, rump, and flank (cut extra fat off meat you fix).  Whole grain breads, cereals and pasta.  Skim and nonfat dry milk.  Low-fat yogurt.  Poultry without the skin.  Cheese made with skim or part-skim milk, such as mozzarella, parmesan, farmers', ricotta, or pot cheese. These are higher fat foods. Eat LESS of these:   Whole milk and foods made from whole milk, such as American, blue, cheddar, monterey jack, and swiss cheese  High-fat meats, such as luncheon meats, sausages, knockwurst, bratwurst, hot dogs, ribs, corned beef, ground pork, and regular ground beef.  Fried foods. Limit saturated fats in your diet. Substituting unsaturated fat for saturated fat may decrease your blood triglyceride level. You will need to read package labels to know which products contain saturated fats.  These foods are high in saturated fat. Eat LESS of these:   Fried pork skins.  Whole milk.  Skin and fat from poultry.  Palm oil.  Butter.  Shortening.  Cream cheese.  Bacon.  Margarines and baked goods made from listed oils.  Vegetable shortenings.  Chitterlings.  Fat from meats.  Coconut oil.  Palm kernel oil.  Lard.  Cream.  Sour cream.  Fatback.  Coffee whiteners and non-dairy creamers made with these oils.  Cheese made from whole milk. Use unsaturated fats (both polyunsaturated and monounsaturated) moderately. Remember, even though unsaturated fats are better than saturated fats; you still want a diet low in total fat.  These foods are high in unsaturated fat:   Canola oil.  Sunflower oil.  Mayonnaise.  Almonds.  Peanuts.  Pine nuts.  Margarines made with these oils.  Safflower oil.  Olive oil.  Avocados.  Cashews.  Peanut butter.  Sunflower seeds.  Soybean oil.  Peanut  oil.  Olives.  Pecans.  Walnuts.  Pumpkin seeds. Avoid sugar and other high-sugar foods. This will decrease carbohydrates without decreasing other nutrients. Sugar in your food goes rapidly to your blood. When there is excess sugar in your blood, your liver may use it to make more triglycerides. Sugar also contains calories without other important nutrients.  Eat LESS of these:   Sugar, brown sugar, powdered sugar, jam, jelly, preserves, honey, syrup, molasses, pies, candy, cakes, cookies, frosting, pastries, colas, soft drinks, punches, fruit drinks, and regular gelatin.  Avoid alcohol. Alcohol, even more than sugar, may increase blood triglycerides. In addition, alcohol is high in calories and low in nutrients. Ask for sparkling water, or a diet soft drink instead of an alcoholic beverage. Suggestions for planning and preparing meals   Bake, broil, grill or roast meats instead of frying.  Remove fat from meats and skin from poultry before cooking.  Add spices, herbs, lemon juice or vinegar to vegetables instead of salt, rich sauces or gravies.  Use a non-stick skillet without fat or use no-stick sprays.  Cool and refrigerate stews and broth. Then remove the hardened fat floating on the surface before serving.  Refrigerate meat drippings and skim off fat to make low-fat gravies.  Serve more fish.  Use less butter,   margarine and other high-fat spreads on bread or vegetables.  Use skim or reconstituted non-fat dry milk for cooking.  Cook with low-fat cheeses.  Substitute low-fat yogurt or cottage cheese for all or part of the sour cream in recipes for sauces, dips or congealed salads.  Use half yogurt/half mayonnaise in salad recipes.  Substitute evaporated skim milk for cream. Evaporated skim milk or reconstituted non-fat dry milk can be whipped and substituted for whipped cream in certain recipes.  Choose fresh fruits for dessert instead of high-fat foods such as pies or  cakes. Fruits are naturally low in fat. When Dining Out   Order low-fat appetizers such as fruit or vegetable juice, pasta with vegetables or tomato sauce.  Select clear, rather than cream soups.  Ask that dressings and gravies be served on the side. Then use less of them.  Order foods that are baked, broiled, poached, steamed, stir-fried, or roasted.  Ask for margarine instead of butter, and use only a small amount.  Drink sparkling water, unsweetened tea or coffee, or diet soft drinks instead of alcohol or other sweet beverages. QUESTIONS AND ANSWERS ABOUT OTHER FATS IN THE BLOOD: SATURATED FAT, TRANS FAT, AND CHOLESTEROL What is trans fat? Trans fat is a type of fat that is formed when vegetable oil is hardened through a process called hydrogenation. This process helps makes foods more solid, gives them shape, and prolongs their shelf life. Trans fats are also called hydrogenated or partially hydrogenated oils.  What do saturated fat, trans fat, and cholesterol in foods have to do with heart disease? Saturated fat, trans fat, and cholesterol in the diet all raise the level of LDL "bad" cholesterol in the blood. The higher the LDL cholesterol, the greater the risk for coronary heart disease (CHD). Saturated fat and trans fat raise LDL similarly.  What foods contain saturated fat, trans fat, and cholesterol? High amounts of saturated fat are found in animal products, such as fatty cuts of meat, chicken skin, and full-fat dairy products like butter, whole milk, cream, and cheese, and in tropical vegetable oils such as palm, palm kernel, and coconut oil. Trans fat is found in some of the same foods as saturated fat, such as vegetable shortening, some margarines (especially hard or stick margarine), crackers, cookies, baked goods, fried foods, salad dressings, and other processed foods made with partially hydrogenated vegetable oils. Small amounts of trans fat also occur naturally in some animal  products, such as milk products, beef, and lamb. Foods high in cholesterol include liver, other organ meats, egg yolks, shrimp, and full-fat dairy products. How can I use the new food label to make heart-healthy food choices? Check the Nutrition Facts panel of the food label. Choose foods lower in saturated fat, trans fat, and cholesterol. For saturated fat and cholesterol, you can also use the Percent Daily Value (%DV): 5% DV or less is low, and 20% DV or more is high. (There is no %DV for trans fat.) Use the Nutrition Facts panel to choose foods low in saturated fat and cholesterol, and if the trans fat is not listed, read the ingredients and limit products that list shortening or hydrogenated or partially hydrogenated vegetable oil, which tend to be high in trans fat. POINTS TO REMEMBER:   Discuss your risk for heart disease with your caregivers, and take steps to reduce risk factors.  Change your diet. Choose foods that are low in saturated fat, trans fat, and cholesterol.  Add exercise to your daily routine if   it is not already being done. Participate in physical activity of moderate intensity, like brisk walking, for at least 30 minutes on most, and preferably all days of the week. No time? Break the 30 minutes into three, 10-minute segments during the day.  Stop smoking. If you do smoke, contact your caregiver to discuss ways in which they can help you quit.  Do not use street drugs.  Maintain a normal weight.  Maintain a healthy blood pressure.  Keep up with your blood work for checking the fats in your blood as directed by your caregiver. Document Released: 07/21/2004 Document Revised: 04/03/2012 Document Reviewed: 02/16/2009 [employer] Patient Information 2013 [employer].  

## 2012-11-05 ENCOUNTER — Other Ambulatory Visit: Payer: Self-pay | Admitting: Family Medicine

## 2012-11-05 MED ORDER — SUMATRIPTAN SUCCINATE 100 MG PO TABS: 100.0000 mg | ORAL_TABLET | ORAL | Status: DC

## 2012-11-05 NOTE — Telephone Encounter (Signed)
Please advise refill? 

## 2012-11-05 NOTE — Telephone Encounter (Signed)
OK to refill the Imitrex 100 mg tab 1 tab po prn HA, may repeat x once in 2 hours if HA persists, max of 2 tabs/24 hr. Disp #27, 1rf

## 2012-11-06 ENCOUNTER — Other Ambulatory Visit: Payer: Self-pay | Admitting: *Deleted

## 2012-11-06 NOTE — Telephone Encounter (Signed)
Recvd fax for Prior Authorization Imitrex; called pt's Insurance carrier [facility] [phone] opt 3, opt 1], PA not required, too soon for refill request; records show last fill was for 90-day supply on 11.03.13, Rx cannot be filled again until February, pharmacy informed & message left on pt's VM with contact name & number regarding this matter/SLS

## 2012-11-14 ENCOUNTER — Encounter: Payer: BC Managed Care – PPO | Admitting: Gynecology

## 2012-11-19 ENCOUNTER — Telehealth: Payer: Self-pay | Admitting: *Deleted

## 2012-11-19 NOTE — Telephone Encounter (Signed)
Received call from pt stating her insurance is going to require an override in order for her to get a 3 month supply at a time. Pt is going to go ahead and get a 1 month supply at present. Please let pt know once override has been obtained.

## 2012-11-19 NOTE — Telephone Encounter (Signed)
Pt informed that as soon as we get the pa we will start on this. Pt states that [facility] was supposed to send this already, so she is going to check with them.

## 2012-11-20 ENCOUNTER — Encounter: Payer: Self-pay | Admitting: Gynecology

## 2012-11-20 ENCOUNTER — Ambulatory Visit (INDEPENDENT_AMBULATORY_CARE_PROVIDER_SITE_OTHER): Payer: BC Managed Care – PPO | Admitting: Gynecology

## 2012-11-20 VITALS — BP 120/74 | Ht 63.5 in | Wt 118.0 lb

## 2012-11-20 DIAGNOSIS — IMO0002 Reserved for concepts with insufficient information to code with codable children: Secondary | ICD-10-CM

## 2012-11-20 DIAGNOSIS — Z79890 Hormone replacement therapy (postmenopausal): Secondary | ICD-10-CM

## 2012-11-20 DIAGNOSIS — Z01419 Encounter for gynecological examination (general) (routine) without abnormal findings: Secondary | ICD-10-CM

## 2012-11-20 MED ORDER — ESTERIFIED ESTROGENS 1.25 MG PO TABS: ORAL_TABLET | ORAL | Status: DC

## 2012-11-20 NOTE — Patient Instructions (Signed)
Consider breast cancer gene testing. Call me if you want to pursue this or be referred to a genetic counselor. Follow up in one year for annual gynecologic exam.

## 2012-11-20 NOTE — Progress Notes (Signed)
HEATHER MCKENDREE [birth year] [id number]        62 y.o.  G2P2002 for annual exam.  Former patient of Dr. Verl Dicker with several issues noted below.  Past medical history,surgical history, medications, allergies, family history and social history were all reviewed and documented in the EPIC chart. ROS:  Was performed and pertinent positives and negatives are included in the history.  Exam: [name] assistant Filed Vitals:   11/20/62 1558  BP: 120/74  Height: 5' 3.5" (1.613 m)  Weight: 118 lb (53.524 kg)   General appearance  Normal Skin grossly normal Head/Neck normal with no cervical or supraclavicular adenopathy thyroid normal Lungs  clear Cardiac RR, without RMG Abdominal  soft, nontender, without masses, organomegaly or hernia. Healing abdominoplasty scar is noted Breasts  examined lying and sitting without masses, retractions, discharge or axillary adenopathy. Pelvic  Ext/BUS/vagina  normal with mild atrophic changes  Adnexa  Without masses or tenderness    Anus and perineum  normal   Rectovaginal  normal sphincter tone without palpated masses or tenderness.    Assessment/Plan:  62 y.o. [id number] female for annual exam.   1. HRT. Patient is on Menest 1.25 mg doing well. Patient wants to continue. I reviewed the risks of HRT to include stroke heart attack DVT and breast cancer. Transdermal advantages/first pass effect reviewed. ACOG and NAMS statements for lowest dose for shortest period of time discussed. Patient understands all this wants to continue I refilled her times a year. 2. Dyspareunia/atrophic vaginitis. Patients on a formulated estradiol vaginal cream supplement to her Menest. She uses it intermittently 2-3 times weekly. Had tried Vagifem but didn't work well. Also tried Estrace type vaginal cream which was too messy and didn't work well. She is on a formulated estradiol 0.02% cream 1 cc which equals 0.2 mg per cc. She has enough to last her main will call us when she needs a  refill [facility]. 3. Mammography 03/2012. Patient has a history of breast cancer in her mother age 67, maternal aunt age 85 and another maternal aunt age 35. I reviewed possible genetic linkage and options for genetic testing with BRCA gene testing. Issues with the test as far as negative does not guarantee she does not carry a mutation not tested and what she would do with a positive result such as prophylactic mastectomy or adding MRI to her screening. Offered to draw testing today versus genetic counseling and testing based on their assessment. Patient declines at present will follow up if she wants to pursue genetic testing. I did recommend 3-D mammography when she gets her mammograms regardless. SBE monthly reviewed. 4. 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 Has been on HRT since then doing well as per above. 5. Pap smear 2013. No Pap smear done today. History of CIN-1 2005 with normal Pap smears since then.  I reviewed current screening guidelines. She is status post hysterectomy for benign indications and options to stop screening altogether versus less frequent screening reviewed. We'll readdress on an annual basis. 6. Osteopenia. DEXA 11/2011 with T score -1.8. FRAX 7%/0.5%. Plan repeat next year a 2 year interval. Increase calcium vitamin D reviewed. 7. Health maintenance. No lab work done as this is all done through her primary physician's office who sees her on a regular basis. Follow up one year, sooner as needed.   Dara Lords MD, 4:40 PM 11/20/2012

## 2012-11-28 ENCOUNTER — Telehealth: Payer: Self-pay

## 2012-11-28 NOTE — Telephone Encounter (Signed)
Left a message for patient to return my call. We received paperwork for PA on Imitrex. MD needs to know if pt has 4 or more headaches? Has pt tried a NSAID, Acetaminophen, Ergotamine containing products, or Midrin? Has Pt tried a prophylactic med like Topamax or Propanolol?

## 2012-12-05 ENCOUNTER — Telehealth: Payer: Self-pay

## 2012-12-05 NOTE — Telephone Encounter (Signed)
PA sent in to pharmacy for Imitrex.  Patient states she does get 4 headaches or more a month sometimes.  Pt doesn't remember names of previous medications

## 2012-12-05 NOTE — Telephone Encounter (Signed)
Left a detailed message on v/m

## 2012-12-05 NOTE — Telephone Encounter (Signed)
Patient is in a conference today so she is going to CB on her next break around 11:30. I transferred her to VM to leave a detailed mess.

## 2012-12-05 NOTE — Telephone Encounter (Signed)
Opened in order

## 2012-12-05 NOTE — Telephone Encounter (Signed)
Pa form Imitrex faxed

## 2012-12-07 ENCOUNTER — Other Ambulatory Visit: Payer: Self-pay | Admitting: Family Medicine

## 2012-12-07 ENCOUNTER — Telehealth: Payer: Self-pay | Admitting: Family Medicine

## 2012-12-07 DIAGNOSIS — G43909 Migraine, unspecified, not intractable, without status migrainosus: Secondary | ICD-10-CM

## 2012-12-07 MED ORDER — SUMATRIPTAN SUCCINATE 100 MG PO TABS: 100.0000 mg | ORAL_TABLET | ORAL | Status: DC

## 2012-12-07 NOTE — Telephone Encounter (Signed)
Spoke with patient and she states her primary pharmacy is [facility] [address].  Patient does not use [facility].

## 2012-12-07 NOTE — Telephone Encounter (Signed)
[facility] pharmacy called stating they do not carry Imitrex and they will cancel the prescription. Please refer to last documentation.

## 2012-12-07 NOTE — Telephone Encounter (Signed)
So I sent in 9 tabs locally in case she gets in trouble. I also sent 27 into her pharmacy once already and have never had to send it in explicitly saying 90 day supply previously but am certainly willing to do so. I have just sent the new rx to [facility]. Please let patient know.

## 2012-12-08 ENCOUNTER — Other Ambulatory Visit: Payer: Self-pay | Admitting: Family Medicine

## 2012-12-08 DIAGNOSIS — G43909 Migraine, unspecified, not intractable, without status migrainosus: Secondary | ICD-10-CM

## 2012-12-08 MED ORDER — SUMATRIPTAN SUCCINATE 100 MG PO TABS: 100.0000 mg | ORAL_TABLET | ORAL | Status: DC

## 2012-12-08 NOTE — Telephone Encounter (Signed)
So that needs to be information I get in the first note. I cannot keep redoing things at off hours. When patient's call We need to know what, when, where and how in detaileach time. I cannot guess what they want and where they want it. I sent it

## 2012-12-10 NOTE — Telephone Encounter (Signed)
RX printed at [facility] office.  Please reprint for Dr. Abner Greenspan to sign.

## 2012-12-10 NOTE — Telephone Encounter (Signed)
Called in script

## 2013-02-22 ENCOUNTER — Encounter: Payer: Self-pay | Admitting: Family Medicine

## 2013-02-22 ENCOUNTER — Ambulatory Visit (INDEPENDENT_AMBULATORY_CARE_PROVIDER_SITE_OTHER): Payer: BC Managed Care – PPO | Admitting: Family Medicine

## 2013-02-22 VITALS — BP 106/70 | HR 77 | Temp 98.1°F | Ht 63.0 in | Wt 116.1 lb

## 2013-02-22 DIAGNOSIS — Z0000 Encounter for general adult medical examination without abnormal findings: Secondary | ICD-10-CM

## 2013-02-22 DIAGNOSIS — F411 Generalized anxiety disorder: Secondary | ICD-10-CM

## 2013-02-22 DIAGNOSIS — E785 Hyperlipidemia, unspecified: Secondary | ICD-10-CM

## 2013-02-22 DIAGNOSIS — G43909 Migraine, unspecified, not intractable, without status migrainosus: Secondary | ICD-10-CM

## 2013-02-22 LAB — HEPATIC FUNCTION PANEL
Albumin: 4.5 g/dL (ref 3.5–5.2)
Alkaline Phosphatase: 87 U/L (ref 39–117)
Bilirubin, Direct: 0.1 mg/dL (ref 0.0–0.3)
Indirect Bilirubin: 0.6 mg/dL (ref 0.0–0.9)
Total Bilirubin: 0.7 mg/dL (ref 0.3–1.2)

## 2013-02-22 LAB — LIPID PANEL
HDL: 65 mg/dL (ref >39)
LDL Cholesterol: 95 mg/dL (ref 0–99)
Total CHOL/HDL Ratio: 2.9 Ratio
VLDL: 27 mg/dL (ref 0–40)

## 2013-02-22 LAB — RENAL FUNCTION PANEL
Chloride: 101 mEq/L (ref 96–112)
Phosphorus: 3.7 mg/dL (ref 2.3–4.6)
Potassium: 4.7 mEq/L (ref 3.5–5.3)
Sodium: 139 mEq/L (ref 135–145)

## 2013-02-22 LAB — CBC
Platelets: 355 10*3/uL (ref 150–400)
RBC: 4.38 MIL/uL (ref 3.87–5.11)
WBC: 7.1 10*3/uL (ref 4.0–10.5)

## 2013-02-22 LAB — TSH: TSH: 1.921 u[IU]/mL (ref 0.350–4.500)

## 2013-02-22 MED ORDER — SUMATRIPTAN SUCCINATE 100 MG PO TABS: 100.0000 mg | ORAL_TABLET | ORAL | Status: DC

## 2013-02-22 MED ORDER — ATORVASTATIN CALCIUM 40 MG PO TABS: 40.0000 mg | ORAL_TABLET | Freq: Every day | ORAL | Status: DC

## 2013-02-22 MED ORDER — ALPRAZOLAM 0.25 MG PO TABS: 0.2500 mg | ORAL_TABLET | Freq: Two times a day (BID) | ORAL | Status: DC | PRN

## 2013-02-22 NOTE — Patient Instructions (Addendum)
Next visit 6-9 annual   Migraine Headache A migraine headache is an intense, throbbing pain on one or both sides of your head. A migraine can last for 30 minutes to several hours. CAUSES  The exact cause of a migraine headache is not always known. However, a migraine may be caused when nerves in the brain become irritated and release chemicals that cause inflammation. This causes pain. SYMPTOMS  Pain on one or both sides of your head.  Pulsating or throbbing pain.  Severe pain that prevents daily activities.  Pain that is aggravated by any physical activity.  Nausea, vomiting, or both.  Dizziness.  Pain with exposure to bright lights, loud noises, or activity.  General sensitivity to bright lights, loud noises, or smells. Before you get a migraine, you may get warning signs that a migraine is coming (aura). An aura may include:  Seeing flashing lights.  Seeing bright spots, halos, or zig-zag lines.  Having tunnel vision or blurred vision.  Having feelings of numbness or tingling.  Having trouble talking.  Having muscle weakness. MIGRAINE TRIGGERS  Alcohol.  Smoking.  Stress.  Menstruation.  Aged cheeses.  Foods or drinks that contain nitrates, glutamate, aspartame, or tyramine.  Lack of sleep.  Chocolate.  Caffeine.  Hunger.  Physical exertion.  Fatigue.  Medicines used to treat chest pain (nitroglycerine), birth control pills, estrogen, and some blood pressure medicines. DIAGNOSIS  A migraine headache is often diagnosed based on:  Symptoms.  Physical examination.  A CT scan or MRI of your head. TREATMENT Medicines may be given for pain and nausea. Medicines can also be given to help prevent recurrent migraines.  HOME CARE INSTRUCTIONS  Only take over-the-counter or prescription medicines for pain or discomfort as directed by your caregiver. The use of long-term narcotics is not recommended.  Lie down in a dark, quiet room when you have a  migraine.  Keep a journal to find out what may trigger your migraine headaches. For example, write down:  What you eat and drink.  How much sleep you get.  Any change to your diet or medicines.  Limit alcohol consumption.  Quit smoking if you smoke.  Get 7 to 9 hours of sleep, or as recommended by your caregiver.  Limit stress.  Keep lights dim if bright lights bother you and make your migraines worse. SEEK IMMEDIATE MEDICAL CARE IF:   Your migraine becomes severe.  You have a fever.  You have a stiff neck.  You have vision loss.  You have muscular weakness or loss of muscle control.  You start losing your balance or have trouble walking.  You feel faint or pass out.  You have severe symptoms that are different from your first symptoms. MAKE SURE YOU:   Understand these instructions.  Will watch your condition.  Will get help right away if you are not doing well or get worse. Document Released: 10/03/2005 Document Revised: 12/26/2011 Document Reviewed: 09/23/2011 [facility] Patient Information 2013 [place], [place].

## 2013-02-24 NOTE — Assessment & Plan Note (Signed)
Refills given on Imitrex. Will continue with 30 day prescriptions with refills. Encouraged regular sleep, small, frequent meals

## 2013-02-24 NOTE — Progress Notes (Signed)
Patient ID: Donna Guerra, female   DOB: 1951/07/04, 62 y.o.   MRN: [id number] PA TENNANT [id number] 1951/02/26 02/24/2013      Progress Note-Follow Up  Subjective  Chief Complaint  Chief Complaint  Patient presents with  . Follow-up    medication    HPI Patient is a 62 year old female who is in today for followup. Generally doing well. Is in need of refill on her Imitrex and her Lipitor. Imitrex does help when she needs it but unfortunately she's not having frequent migraines. No recent illness. No fevers or chills. No shortness or breath, chest pain, palpitations, GI or GU complaints. Is taking medications as prescribed.  Past Medical History  Diagnosis Date  . CIN I (cervical intraepithelial neoplasia I) 2005  . Osteopenia 11/2011    t score -1.8FRAX 7%/0.5%  . Migraines   . Elevated cholesterol   . Right shoulder pain 07/08/2012  . Colon polyps 07/08/2012  . Hyperlipidemia 08/29/2012  . Skin cancer 07/08/2012    Basal cell    Past Surgical History  Procedure Laterality Date  . Cholecystectomy    . Tubal ligation    . Oophorectomy  2002    BSO  . Shoulder surgery      right rotator cuff repair  . Colposcopy    . Gynecologic cryosurgery    . Cesarean section  1978 and 1981  . Tonsillectomy and adenoidectomy      age 84  . Vaginal hysterectomy  2002    LAVH BSO  . Tummy tuck      Family History  Problem Relation Age of Onset  . Hypertension Mother   . Breast cancer Mother 56  . Cancer Mother 45    breast  . Breast cancer Maternal Aunt 37  . Breast cancer Maternal Aunt     50's  . Migraines Father   . Migraines Sister   . Diabetes Maternal Grandmother     History   Social History  . Marital Status: Married    Spouse Name: N/A    Number of Children: N/A  . Years of Education: N/A   Occupational History  . Not on file.   Social History Main Topics  . Smoking status: Never Smoker   . Smokeless tobacco: Never Used  . Alcohol Use: Yes   Comment: occasionally  . Drug Use: No  . Sexually Active: Yes -- Female partner(s)    Birth Control/ Protection: Surgical   Other Topics Concern  . Not on file   Social History Narrative  . No narrative on file    Current Outpatient Prescriptions on File Prior to Visit  Medication Sig Dispense Refill  . Cholecalciferol (VITAMIN D PO) Take 2,000 Units by mouth.      . Esterified Estrogens (MENEST) 1.25 MG TABS One daily  30 each  12  . fish oil-omega-3 fatty acids 1000 MG capsule Take 1 g by mouth daily.       No current facility-administered medications on file prior to visit.    No Known Allergies  Review of Systems  Review of Systems  Constitutional: Negative for fever and malaise/fatigue.  HENT: Negative for congestion.   Eyes: Negative for discharge.  Respiratory: Negative for shortness of breath.   Cardiovascular: Negative for chest pain, palpitations and leg swelling.  Gastrointestinal: Negative for nausea, abdominal pain and diarrhea.  Genitourinary: Negative for dysuria.  Musculoskeletal: Negative for falls.  Skin: Negative for rash.  Neurological: Negative for loss of consciousness and  headaches.  Endo/Heme/Allergies: Negative for polydipsia.  Psychiatric/Behavioral: Negative for depression and suicidal ideas. The patient is not nervous/anxious and does not have insomnia.     Objective  BP 106/70  Pulse 77  Temp(Src) 98.1 F (36.7 C) (Oral)  Ht 5\' 3"  (1.6 m)  Wt 116 lb 1.3 oz (52.654 kg)  BMI 20.57 kg/m2  SpO2 98%  Physical Exam  Physical Exam  Constitutional: She is oriented to person, place, and time and well-developed, well-nourished, and in no distress. No distress.  HENT:  Head: Normocephalic and atraumatic.  Eyes: Conjunctivae are normal.  Neck: Neck supple. No thyromegaly present.  Cardiovascular: Normal rate, regular rhythm and normal heart sounds.   No murmur heard. Pulmonary/Chest: Effort normal and breath sounds normal. She has no  wheezes.  Abdominal: She exhibits no distension and no mass.  Musculoskeletal: She exhibits no edema.  Lymphadenopathy:    She has no cervical adenopathy.  Neurological: She is alert and oriented to person, place, and time.  Skin: Skin is warm and dry. No rash noted. She is not diaphoretic.  Psychiatric: Memory, affect and judgment normal.    Lab Results  Component Value Date   TSH 1.921 02/22/2013   Lab Results  Component Value Date   WBC 7.1 02/22/2013   HGB 14.2 02/22/2013   HCT 40.0 02/22/2013   MCV 91.3 02/22/2013   PLT 355 02/22/2013   Lab Results  Component Value Date   CREATININE 0.69 02/22/2013   BUN 17 02/22/2013   NA 139 02/22/2013   K 4.7 02/22/2013   CL 101 02/22/2013   CO2 29 02/22/2013   Lab Results  Component Value Date   ALT 12 02/22/2013   AST 16 02/22/2013   ALKPHOS 87 02/22/2013   BILITOT 0.7 02/22/2013   Lab Results  Component Value Date   CHOL 187 02/22/2013   Lab Results  Component Value Date   HDL 65 02/22/2013   Lab Results  Component Value Date   LDLCALC 95 02/22/2013   Lab Results  Component Value Date   TRIG 135 02/22/2013   Lab Results  Component Value Date   CHOLHDL 2.9 02/22/2013     Assessment & Plan  Migraines Refills given on Imitrex. Will continue with 30 day prescriptions with refills. Encouraged regular sleep, small, frequent meals  Hyperlipidemia Given refill on Lipitor, avoid trans fats, start krill oil caps

## 2013-02-24 NOTE — Assessment & Plan Note (Signed)
Given refill on Lipitor, avoid trans fats, start krill oil caps

## 2013-02-25 ENCOUNTER — Encounter: Payer: Self-pay | Admitting: *Deleted

## 2013-02-27 ENCOUNTER — Telehealth: Payer: Self-pay

## 2013-02-27 NOTE — Telephone Encounter (Signed)
PA sent to [facility] for Imitrex

## 2013-02-28 NOTE — Telephone Encounter (Signed)
Victorino Dike with [employer] left a message stating that Imitrex doesn't need a PA- the reason its not going through is pt can only get 6 tablets with 30 days.

## 2013-03-14 ENCOUNTER — Telehealth: Payer: Self-pay

## 2013-03-14 ENCOUNTER — Telehealth: Payer: Self-pay | Admitting: Family Medicine

## 2013-03-14 NOTE — Telephone Encounter (Signed)
Patient is calling stating that the pharmacy is telling her something different than what I'm telling her. Pt states that the pharmacy is telling her that she needs an authorization of why she needs this RX.Donna Guerra  I spoke to Godfrey and informed him that a PA was done in Feb and was approved through 2016. Ronaldo Miyamoto is stating the same thing that a PA doesn't need to be done pt just needs to wait 30 full days before insurance will pay for this.   I informed pt and she voices understanding

## 2013-03-14 NOTE — Telephone Encounter (Signed)
Left a message for pt on cell phone stating I called [facility] and I looked at what [employer] said when the PA was done in Feb. Pts insurance will only pay for 9 (100 mg tabs) per 30 days.  Pharmacy also stated that they have informed pt of this also.

## 2013-03-14 NOTE — Telephone Encounter (Signed)
Patient called stating that she is very upset as to why Sumatripten hasn't been called in to the pharmacy. She states that she only has 3 pills left. She is requesting a call back regarding this.

## 2013-03-26 ENCOUNTER — Other Ambulatory Visit: Payer: Self-pay

## 2013-03-26 DIAGNOSIS — Z1231 Encounter for screening mammogram for malignant neoplasm of breast: Secondary | ICD-10-CM

## 2013-04-22 ENCOUNTER — Ambulatory Visit
Admission: RE | Admit: 2013-04-22 | Discharge: 2013-04-22 | Disposition: A | Discharge status: Home / Self Care | Payer: BC Managed Care – PPO | Source: Ambulatory Visit

## 2013-04-22 DIAGNOSIS — Z1231 Encounter for screening mammogram for malignant neoplasm of breast: Secondary | ICD-10-CM

## 2013-11-06 ENCOUNTER — Ambulatory Visit (INDEPENDENT_AMBULATORY_CARE_PROVIDER_SITE_OTHER): Payer: BC Managed Care – PPO | Admitting: Physician Assistant

## 2013-11-06 ENCOUNTER — Encounter: Payer: Self-pay | Admitting: Physician Assistant

## 2013-11-06 VITALS — BP 100/68 | HR 89 | Temp 99.6°F | Resp 16 | Ht 63.0 in | Wt 123.5 lb

## 2013-11-06 DIAGNOSIS — B9789 Other viral agents as the cause of diseases classified elsewhere: Principal | ICD-10-CM

## 2013-11-06 DIAGNOSIS — J069 Acute upper respiratory infection, unspecified: Secondary | ICD-10-CM

## 2013-11-06 DIAGNOSIS — R059 Cough, unspecified: Secondary | ICD-10-CM

## 2013-11-06 DIAGNOSIS — R05 Cough: Secondary | ICD-10-CM

## 2013-11-06 MED ORDER — HYDROCOD POLST-CHLORPHEN POLST 10-8 MG/5ML PO LQCR: 5.0000 mL | Freq: Two times a day (BID) | ORAL | Status: DC | PRN

## 2013-11-06 NOTE — Progress Notes (Signed)
Patient presents to clinic today c/o 2 days of nonproductive cough, nasal congestion, postnasal drip, intermittent fever, and muslce aches.  Patient denies shortness of breath or wheezing.  Endorses being around her grandchild this past weekend who was sick with a virus.  Endorses having her flu shot this year.  Has taken some OTC medications that have somewhat helped with her symptoms.  Has not taken anything for fever.  Patient denies recent foreign travel or travel via plane.    Past Medical History  Diagnosis Date  . CIN I (cervical intraepithelial neoplasia I) 2005  . Osteopenia 11/2011    t score -1.8FRAX 7%/0.5%  . Migraines   . Elevated cholesterol   . Right shoulder pain 07/08/2012  . Colon polyps 07/08/2012  . Hyperlipidemia 08/29/2012  . Skin cancer 07/08/2012    Basal cell    Current Outpatient Prescriptions on File Prior to Visit  Medication Sig Dispense Refill  . atorvastatin (LIPITOR) 40 MG tablet Take 1 tablet (40 mg total) by mouth daily.  30 tablet  11  . Cholecalciferol (VITAMIN D PO) Take 2,000 Units by mouth.      . Esterified Estrogens (MENEST) 1.25 MG TABS One daily  30 each  12  . fish oil-omega-3 fatty acids 1000 MG capsule Take 1 g by mouth daily.      . SUMAtriptan (IMITREX) 100 MG tablet Take 1 tablet (100 mg total) by mouth as directed.  9 tablet  11   No current facility-administered medications on file prior to visit.    No Known Allergies  Family History  Problem Relation Age of Onset  . Hypertension Mother   . Breast cancer Mother 67  . Cancer Mother 76    breast  . Breast cancer Maternal Aunt 80  . Breast cancer Maternal Aunt     50's  . Migraines Father   . Migraines Sister   . Diabetes Maternal Grandmother     History   Social History  . Marital Status: Married    Spouse Name: N/A    Number of Children: N/A  . Years of Education: N/A   Social History Main Topics  . Smoking status: Never Smoker   . Smokeless tobacco: Never Used  .  Alcohol Use: Yes     Comment: occasionally  . Drug Use: No  . Sexual Activity: Yes    Partners: Male    Birth Control/ Protection: Surgical   Other Topics Concern  . None   Social History Narrative  . None   Review of Systems - See HPI.  All other ROS are negative.  Filed Vitals:   11/06/13 0722  BP: 100/68  Pulse: 89  Temp: 99.6 F (37.6 C)  Resp: 16   Physical Exam  Vitals reviewed. Constitutional: She is oriented to person, place, and time and well-developed, well-nourished, and in no distress.  HENT:  Head: Normocephalic and atraumatic.  Right Ear: External ear normal.  Left Ear: External ear normal.  Nose: Nose normal.  Mouth/Throat: Oropharynx is clear and moist. No oropharyngeal exudate.  TM within normal limits bilaterally.  No tenderness to percussion of sinuses noted on examination.  Eyes: Conjunctivae are normal. Pupils are equal, round, and reactive to light.  Neck: Neck supple.  Cardiovascular: Normal rate, regular rhythm and normal heart sounds.   Pulmonary/Chest: Effort normal and breath sounds normal. No respiratory distress. She has no wheezes. She has no rales. She exhibits no tenderness.  Lymphadenopathy:    She has no  cervical adenopathy.  Neurological: She is alert and oriented to person, place, and time.  Skin: Skin is warm and dry. No rash noted.  Psychiatric: Affect normal.   Flu swab negative for Influenza A or B.  Assessment/Plan: No problem-specific assessment & plan notes found for this encounter.

## 2013-11-06 NOTE — Progress Notes (Signed)
Pre visit review using our clinic review tool, if applicable. No additional management support is needed unless otherwise documented below in the visit note/SLS  

## 2013-11-06 NOTE — Assessment & Plan Note (Addendum)
Flu swab negative.  Increase fluid intake.  Rest.  Saline nasal spray.  MTV and/or zinc supplement.  Mucinex. Tylenol for aches and fever.  Rx Tussionex for cough.  Humidifier in bedroom.  Patient educated on alarm symptoms and when to return to clinic.  Patient instructed to call or return if symptoms not improving over the next 4-5 days.

## 2013-11-06 NOTE — Patient Instructions (Signed)
Increase fluid intake.  Rest.  Saline nasal spray.  Take a multivitamin or zinc supplement.  Mucinex for congestion.  Tylenol for fever and aching.  Please call or return to clinic if Temp is 102 or higher, or if the fever does not go down with medication.  Place a humidifier in your bedroom. Use Tussionex as directed.  Please call or return to clinic if symptoms not improving within 4-5 days.

## 2013-11-26 ENCOUNTER — Encounter: Payer: Self-pay | Admitting: Gynecology

## 2013-11-26 ENCOUNTER — Ambulatory Visit (INDEPENDENT_AMBULATORY_CARE_PROVIDER_SITE_OTHER): Payer: BC Managed Care – PPO | Admitting: Gynecology

## 2013-11-26 VITALS — BP 120/76 | Ht 63.0 in | Wt 123.0 lb

## 2013-11-26 DIAGNOSIS — N898 Other specified noninflammatory disorders of vagina: Secondary | ICD-10-CM

## 2013-11-26 DIAGNOSIS — Z01419 Encounter for gynecological examination (general) (routine) without abnormal findings: Secondary | ICD-10-CM

## 2013-11-26 DIAGNOSIS — M8580 Other specified disorders of bone density and structure, unspecified site: Secondary | ICD-10-CM

## 2013-11-26 DIAGNOSIS — M899 Disorder of bone, unspecified: Secondary | ICD-10-CM

## 2013-11-26 DIAGNOSIS — N952 Postmenopausal atrophic vaginitis: Secondary | ICD-10-CM

## 2013-11-26 DIAGNOSIS — M949 Disorder of cartilage, unspecified: Secondary | ICD-10-CM

## 2013-11-26 LAB — URINALYSIS W MICROSCOPIC + REFLEX CULTURE
BILIRUBIN URINE: NEGATIVE
CASTS: NONE SEEN
Glucose, UA: NEGATIVE mg/dL
Hgb urine dipstick: NEGATIVE
Leukocytes, UA: NEGATIVE
NITRITE: NEGATIVE
PH: 7 (ref 5.0–8.0)
Protein, ur: NEGATIVE mg/dL
RBC / HPF: NONE SEEN RBC/hpf (ref <3)
SPECIFIC GRAVITY, URINE: 1.025 (ref 1.005–1.030)
UROBILINOGEN UA: 0.2 mg/dL (ref 0.0–1.0)

## 2013-11-26 LAB — WET PREP FOR TRICH, YEAST, CLUE
CLUE CELLS WET PREP: NONE SEEN
Trich, Wet Prep: NONE SEEN
Yeast Wet Prep HPF POC: NONE SEEN

## 2013-11-26 MED ORDER — ESTERIFIED ESTROGENS 1.25 MG PO TABS: ORAL_TABLET | ORAL | Status: DC

## 2013-11-26 NOTE — Patient Instructions (Signed)
Followup for bone density as scheduled. Start on the estrogen vaginal cream twice weekly. Followup in one year for annual exam, sooner as needed.

## 2013-11-26 NOTE — Progress Notes (Signed)
Donna Guerra [birth year] [id number]        62 y.o.  G2P2002 for annual exam.  Several issues noted below.  Past medical history,surgical history, problem list, medications, allergies, family history and social history were all reviewed and documented in the EPIC chart.  ROS:  Performed and pertinent positives and negatives are included in the history, assessment and plan .  Exam: Donna Guerra assistant Filed Vitals:   11/26/13 1613  BP: 120/76  Height: 5\' 3"  (1.6 m)  Weight: 123 lb (55.792 kg)   General appearance  Normal Skin grossly normal Head/Neck normal with no cervical or supraclavicular adenopathy thyroid normal Lungs  clear Cardiac RR, without RMG Abdominal  soft, nontender, without masses, organomegaly or hernia Breasts  examined lying and sitting without masses, retractions, discharge or axillary adenopathy. Pelvic  Ext/BUS/vagina  Normal with generalized atrophic changes  Adnexa  Without masses or tenderness    Anus and perineum  Normal   Rectovaginal  Normal sphincter tone without palpated masses or tenderness.    Assessment/Plan:  63 y.o. G23P2002 female for annual exam.   1. Postmenopausal/[name] for bleeding and pelvic pain, leiomyoma, simple hyperplasia and adenomyosis. Has been on Menest 1.25 daily doing well and wants to continue.  I again reviewed the whole issue of HRT with her to include the WHI study with increased risk of stroke, heart attack, DVT and breast cancer. The ACOG and NAMS statements for lowest dose for the shortest period of time reviewed. Transdermal versus oral first-pass effect benefit discussed. Patient strongly wants to continue understanding and accepting the risks. She has unacceptable symptoms when she stops it. I refilled her meds next 1.25x1 year. 2. Vaginal dryness. Patient had been using formulated estradiol vaginal cream per Dr. Cherylann Guerra but admits to only using it sporadically. It also tried over-the-counter moisturizers  with some benefit. I reviewed options with her to include OTC products, Vagifem, vaginal estrogen, vaginal rings. Do not think Osphena would be of much benefit since he's already on a fair amount of oral estrogen. After lengthy discussion she wants to retry the vaginal estrogen cream and use it consistently twice weekly. The issues of absorption and risks as per above reviewed. [facility] formulated twice-weekly vaginal estrogen packet. 3. Vaginal discharge. Patient does have a thick white vaginal discharge but admits to using the vaginal estrogen cream this past week. No symptoms such as itching or odor. Wet prep negative. No treatment needed. 4. Osteopenia. DEXA 11/2011 T score -1.8 FRAX 7%/0.5%. Repeat DEXA now at a two-year interval. Check baseline vitamin D today. Increase calcium and vitamin D recommendations reviewed. 5. Pap smear 2013. No Pap smear done today. History of CIN-1 2005 with normal Pap smears since then. Current screening guidelines reviewed options discussed screening and she status post hysterectomy versus less frequent screening intervals reviewed. We'll readdress on an annual basis. 6. Mammography 04/2013. Strong family history of breast cancer as outlined in my 11/20/2012 note. Reoffered genetic testing and patient declines. Issues of prophylactic mastectomy or increased screening with MRIs reviewed and declined. Will continue with annual mammography is and followup if she desires further testing. SBE monthly reviewed. 7. Colonoscopy discussed 11. Repeat at their recommended interval. 8. Health maintenance. Patient has routine blood work done through her primary physician's office. Followup for bone density otherwise one year, sooner as needed.   Note: This document was prepared with digital dictation and possible smart phrase technology. Any transcriptional errors that result  from this process are unintentional.   Donna Auerbach MD, 4:37 PM  11/26/2013

## 2013-11-27 ENCOUNTER — Telehealth: Payer: Self-pay | Admitting: *Deleted

## 2013-11-27 LAB — VITAMIN D 25 HYDROXY (VIT D DEFICIENCY, FRACTURES): Vit D, 25-Hydroxy: 44 ng/mL (ref 30–89)

## 2013-11-27 LAB — URINE CULTURE
COLONY COUNT: NO GROWTH
ORGANISM ID, BACTERIA: NO GROWTH

## 2013-11-27 MED ORDER — NONFORMULARY OR COMPOUNDED ITEM: Status: DC

## 2013-11-27 NOTE — Telephone Encounter (Signed)
Message copied by Thamas Jaegers on Wed Nov 27, 2013 12:35 PM ------      Message from: Anastasio Auerbach      Created: Tue Nov 26, 2013  4:38 PM       [facility] vaginal estrogen cream prefilled syringes twice weekly three-month supply refill x1 year ------

## 2013-11-27 NOTE — Telephone Encounter (Signed)
rx called in

## 2014-01-02 ENCOUNTER — Encounter: Payer: Self-pay | Admitting: Gynecology

## 2014-01-02 ENCOUNTER — Ambulatory Visit (INDEPENDENT_AMBULATORY_CARE_PROVIDER_SITE_OTHER): Payer: BC Managed Care – PPO

## 2014-01-02 DIAGNOSIS — M949 Disorder of cartilage, unspecified: Secondary | ICD-10-CM

## 2014-01-02 DIAGNOSIS — M899 Disorder of bone, unspecified: Secondary | ICD-10-CM

## 2014-01-02 DIAGNOSIS — M8580 Other specified disorders of bone density and structure, unspecified site: Secondary | ICD-10-CM

## 2014-02-06 ENCOUNTER — Ambulatory Visit (INDEPENDENT_AMBULATORY_CARE_PROVIDER_SITE_OTHER): Payer: BC Managed Care – PPO | Admitting: Family Medicine

## 2014-02-06 ENCOUNTER — Encounter: Payer: Self-pay | Admitting: Family Medicine

## 2014-02-06 VITALS — BP 118/70 | HR 73 | Temp 98.3°F | Ht 63.0 in | Wt 123.1 lb

## 2014-02-06 DIAGNOSIS — M8580 Other specified disorders of bone density and structure, unspecified site: Secondary | ICD-10-CM

## 2014-02-06 DIAGNOSIS — G43909 Migraine, unspecified, not intractable, without status migrainosus: Secondary | ICD-10-CM

## 2014-02-06 DIAGNOSIS — M949 Disorder of cartilage, unspecified: Secondary | ICD-10-CM

## 2014-02-06 DIAGNOSIS — M899 Disorder of bone, unspecified: Secondary | ICD-10-CM

## 2014-02-06 DIAGNOSIS — E785 Hyperlipidemia, unspecified: Secondary | ICD-10-CM

## 2014-02-06 MED ORDER — ATORVASTATIN CALCIUM 40 MG PO TABS: 40.0000 mg | ORAL_TABLET | Freq: Every day | ORAL | Status: DC

## 2014-02-06 MED ORDER — SUMATRIPTAN SUCCINATE 100 MG PO TABS: 100.0000 mg | ORAL_TABLET | ORAL | Status: DC

## 2014-02-06 NOTE — Assessment & Plan Note (Signed)
Tolerating statin, encouraged heart healthy diet, avoid trans fats, minimize simple carbs and saturated fats. Increase exercise as tolerated. Had labs at work in Feb that were normal will repeat levels in the fall and she will forward [place] results

## 2014-02-06 NOTE — Progress Notes (Signed)
Pre visit review using our clinic review tool, if applicable. No additional management support is needed unless otherwise documented below in the visit note. 

## 2014-02-06 NOTE — Assessment & Plan Note (Signed)
Encouraged increased hydration, 64 ounces of clear fluids daily. Minimize alcohol and caffeine. Eat small frequent meals with lean proteins and complex carbs. Avoid high and low blood sugars. Get adequate sleep, 7-8 hours a night. Needs exercise daily preferably in the morning. Doing well no recent migraines

## 2014-02-06 NOTE — Patient Instructions (Signed)
Probiotics daily such Digestive Advantage  Cholesterol Cholesterol is a white, waxy, fat-like protein needed by your body in small amounts. The liver makes all the cholesterol you need. It is carried from the liver by the blood through the blood vessels. Deposits (plaque) may build up on blood vessel walls. This makes the arteries narrower and stiffer. Plaque increases the risk for heart attack and stroke. You cannot feel your cholesterol level even if it is very high. The only way to know is by a blood test to check your lipid (fats) levels. Once you know your cholesterol levels, you should keep a record of the test results. Work with your caregiver to to keep your levels in the desired range. WHAT THE RESULTS MEAN:  Total cholesterol is a rough measure of all the cholesterol in your blood.  LDL is the so-called bad cholesterol. This is the type that deposits cholesterol in the walls of the arteries. You want this level to be low.  HDL is the good cholesterol because it cleans the arteries and carries the LDL away. You want this level to be high.  Triglycerides are fat that the body can either burn for energy or store. High levels are closely linked to heart disease. DESIRED LEVELS:  Total cholesterol below 200.  LDL below 100 for people at risk, below 70 for very high risk.  HDL above 50 is good, above 60 is best.  Triglycerides below 150. HOW TO LOWER YOUR CHOLESTEROL:  Diet.  Choose fish or white meat chicken and [place], roasted or baked. Limit fatty cuts of red meat, fried foods, and processed meats, such as sausage and lunch meat.  Eat lots of fresh fruits and vegetables. Choose whole grains, beans, pasta, potatoes and cereals.  Use only small amounts of olive, corn or canola oils. Avoid butter, mayonnaise, shortening or palm kernel oils. Avoid foods with trans-fats.  Use skim/nonfat milk and low-fat/nonfat yogurt and cheeses. Avoid whole milk, cream, ice cream, egg yolks and  cheeses. Healthy desserts include angel food cake, ginger snaps, animal crackers, hard candy, popsicles, and low-fat/nonfat frozen yogurt. Avoid pastries, cakes, pies and cookies.  Exercise.  A regular program helps decrease LDL and raises HDL.  Helps with weight control.  Do things that increase your activity level like gardening, walking, or taking the stairs.  Medication.  May be prescribed by your caregiver to help lowering cholesterol and the risk for heart disease.  You may need medicine even if your levels are normal if you have several risk factors. HOME CARE INSTRUCTIONS   Follow your diet and exercise programs as suggested by your caregiver.  Take medications as directed.  Have blood work done when your caregiver feels it is necessary. MAKE SURE YOU:   Understand these instructions.  Will watch your condition.  Will get help right away if you are not doing well or get worse. Document Released: 06/28/2001 Document Revised: 12/26/2011 Document Reviewed: 07/17/2013 [facility] Patient Information 2014 [place], [place].

## 2014-02-06 NOTE — Progress Notes (Signed)
Patient ID: Donna Guerra, female   DOB: 1950-12-15, 63 y.o.   MRN: [id number] NATACHA JEPSEN [id number] [birth year] 02/06/2014      Progress Note-Follow Up  Subjective  Chief Complaint  Chief Complaint  Patient presents with  . Medication Refill    HPI  Patient is a 63 year old female in today for routine medical care. Doing well. No recent illness. Had labs at work in Feb which were normal. Denies CP/palp/SOB/HA/congestion/fevers/GI or GU c/o. Taking meds as prescribed  Past Medical History  Diagnosis Date  . CIN I (cervical intraepithelial neoplasia I) 2005  . Osteopenia 12/2013    T score -2.0 FRAX 6.7%/0.5%  . Migraines   . Elevated cholesterol   . Right shoulder pain 07/08/2012  . Colon polyps 07/08/2012  . Hyperlipidemia 08/29/2012  . Skin cancer 07/08/2012    Basal cell    Past Surgical History  Procedure Laterality Date  . Cholecystectomy    . Tubal ligation    . Oophorectomy  2002    BSO  . Shoulder surgery      right rotator cuff repair  . Colposcopy    . Gynecologic cryosurgery    . Cesarean section  1978 and 1981  . Tonsillectomy and adenoidectomy      age 9  . Vaginal hysterectomy  2002    LAVH BSO  . Tummy tuck    . Abdominal surgery      Tummy tuck    Family History  Problem Relation Age of Onset  . Hypertension Mother   . Breast cancer Mother 73  . Cancer Mother 26    breast  . Breast cancer Maternal Aunt 80  . Breast cancer Maternal Aunt     50's  . Migraines Father   . Migraines Sister   . Diabetes Maternal Grandmother     History   Social History  . Marital Status: Married    Spouse Name: N/A    Number of Children: N/A  . Years of Education: N/A   Occupational History  . Not on file.   Social History Main Topics  . Smoking status: Never Smoker   . Smokeless tobacco: Never Used  . Alcohol Use: Yes     Comment: occasionally  . Drug Use: No  . Sexual Activity: Yes    Partners: Male    Birth Control/ Protection: Surgical      Comment: HYST   Other Topics Concern  . Not on file   Social History Narrative  . No narrative on file    Current Outpatient Prescriptions on File Prior to Visit  Medication Sig Dispense Refill  . atorvastatin (LIPITOR) 40 MG tablet Take 1 tablet (40 mg total) by mouth daily.  30 tablet  11  . Cholecalciferol (VITAMIN D PO) Take 2,000 Units by mouth.      . Esterified Estrogens (MENEST) 1.25 MG TABS One daily  30 each  12  . fish oil-omega-3 fatty acids 1000 MG capsule Take 1 g by mouth daily.      . NONFORMULARY OR COMPOUNDED ITEM Estradiol vaginal 0.02% cream insert twice weekly vaginally  90 each  3  . SUMAtriptan (IMITREX) 100 MG tablet Take 1 tablet (100 mg total) by mouth as directed.  9 tablet  11   No current facility-administered medications on file prior to visit.    No Known Allergies  Review of Systems  Review of Systems  Constitutional: Negative for fever and malaise/fatigue.  HENT: Negative for  congestion.   Eyes: Negative for discharge.  Respiratory: Negative for shortness of breath.   Cardiovascular: Negative for chest pain, palpitations and leg swelling.  Gastrointestinal: Negative for nausea, abdominal pain and diarrhea.  Genitourinary: Negative for dysuria.  Musculoskeletal: Negative for falls.  Skin: Negative for rash.  Neurological: Negative for loss of consciousness and headaches.  Endo/Heme/Allergies: Negative for polydipsia.  Psychiatric/Behavioral: Negative for depression and suicidal ideas. The patient is not nervous/anxious and does not have insomnia.     Objective  BP 118/70  Pulse 73  Temp(Src) 98.3 F (36.8 C) (Oral)  Ht 5\' 3"  (1.6 m)  Wt 123 lb 1.3 oz (55.829 kg)  BMI 21.81 kg/m2  SpO2 99%  Physical Exam  Physical Exam  Constitutional: She is oriented to person, place, and time and well-developed, well-nourished, and in no distress. No distress.  HENT:  Head: Normocephalic and atraumatic.  Eyes: Conjunctivae are normal.   Neck: Neck supple. No thyromegaly present.  Cardiovascular: Normal rate, regular rhythm and normal heart sounds.   No murmur heard. Pulmonary/Chest: Effort normal and breath sounds normal. She has no wheezes.  Abdominal: She exhibits no distension and no mass.  Musculoskeletal: She exhibits no edema.  Lymphadenopathy:    She has no cervical adenopathy.  Neurological: She is alert and oriented to person, place, and time.  Skin: Skin is warm and dry. No rash noted. She is not diaphoretic.  Psychiatric: Memory, affect and judgment normal.    Lab Results  Component Value Date   TSH 1.921 02/22/2013   Lab Results  Component Value Date   WBC 7.1 02/22/2013   HGB 14.2 02/22/2013   HCT 40.0 02/22/2013   MCV 91.3 02/22/2013   PLT 355 02/22/2013   Lab Results  Component Value Date   CREATININE 0.69 02/22/2013   BUN 17 02/22/2013   NA 139 02/22/2013   K 4.7 02/22/2013   CL 101 02/22/2013   CO2 29 02/22/2013   Lab Results  Component Value Date   ALT 12 02/22/2013   AST 16 02/22/2013   ALKPHOS 87 02/22/2013   BILITOT 0.7 02/22/2013   Lab Results  Component Value Date   CHOL 187 02/22/2013   Lab Results  Component Value Date   HDL 65 02/22/2013   Lab Results  Component Value Date   LDLCALC 95 02/22/2013   Lab Results  Component Value Date   TRIG 135 02/22/2013   Lab Results  Component Value Date   CHOLHDL 2.9 02/22/2013     Assessment & Plan  Hyperlipidemia Tolerating statin, encouraged heart healthy diet, avoid trans fats, minimize simple carbs and saturated fats. Increase exercise as tolerated. Had labs at work in Feb that were normal will repeat levels in the fall and she will forward [place] results  Migraines Encouraged increased hydration, 64 ounces of clear fluids daily. Minimize alcohol and caffeine. Eat small frequent meals with lean proteins and complex carbs. Avoid high and low blood sugars. Get adequate sleep, 7-8 hours a night. Needs exercise daily preferably in the morning. Doing well no  recent migraines  Osteopenia Stay active continue calcium with Vitamin D

## 2014-02-06 NOTE — Assessment & Plan Note (Signed)
Stay active continue calcium with Vitamin D

## 2014-02-07 ENCOUNTER — Telehealth: Payer: Self-pay | Admitting: Family Medicine

## 2014-02-07 NOTE — Telephone Encounter (Signed)
PA paperwork for Imitrex 100mg , forward to nurse

## 2014-02-11 NOTE — Telephone Encounter (Signed)
Left a message for patient. Per md: how many episodes does patient have and has she tried tylenol?

## 2014-02-13 NOTE — Telephone Encounter (Signed)
Received paperwork stating that a PA doesn't need to be done. Pt has to wait 30 days for refill. Last RX was done on 01-16-14.  Faxed to pharmacy

## 2014-02-13 NOTE — Telephone Encounter (Signed)
Pt left a message stating that she has 2-3 headaches monthly. Pt has tried Tylenol, Aleve and Excedrin Migraine.  Added this to pa paperwork and faxed

## 2014-02-13 NOTE — Telephone Encounter (Signed)
Insurance faxed back, no auth required, forward to nurse

## 2014-04-23 ENCOUNTER — Other Ambulatory Visit: Payer: Self-pay

## 2014-04-23 DIAGNOSIS — Z1231 Encounter for screening mammogram for malignant neoplasm of breast: Secondary | ICD-10-CM

## 2014-04-29 ENCOUNTER — Ambulatory Visit: Payer: BC Managed Care – PPO

## 2014-05-05 ENCOUNTER — Ambulatory Visit
Admission: RE | Admit: 2014-05-05 | Discharge: 2014-05-05 | Disposition: A | Discharge status: Home / Self Care | Payer: BC Managed Care – PPO | Source: Ambulatory Visit

## 2014-05-05 DIAGNOSIS — Z1231 Encounter for screening mammogram for malignant neoplasm of breast: Secondary | ICD-10-CM

## 2014-08-18 ENCOUNTER — Encounter: Payer: Self-pay | Admitting: Family Medicine

## 2014-09-03 ENCOUNTER — Telehealth: Payer: Self-pay | Admitting: *Deleted

## 2014-09-03 NOTE — Telephone Encounter (Signed)
Pt currently taking Menest 1.25 mg states price has increase, asked if you know of a possible cheaper HRT Rx? I did explain to patient we don't know price of medications. Please advise

## 2014-09-03 NOTE — Telephone Encounter (Signed)
Can try estradiol 1 mg generic #30 refill through next annual exam.

## 2014-09-04 NOTE — Telephone Encounter (Signed)
Pt is going to check with insurance company and see what teir rx is on

## 2014-09-05 MED ORDER — ESTRADIOL 1 MG PO TABS: 1.0000 mg | ORAL_TABLET | Freq: Every day | ORAL | Status: DC

## 2014-09-05 NOTE — Telephone Encounter (Signed)
Pt said she would like Rx sent, this was done.

## 2014-10-29 ENCOUNTER — Telehealth: Payer: Self-pay | Admitting: *Deleted

## 2014-10-29 NOTE — Telephone Encounter (Signed)
PA authorization form filled out for estradiol 1 mg, form faxed, will wait for response.

## 2014-11-03 NOTE — Telephone Encounter (Signed)
Medication approved from 11/01/14 until 10/17/15.

## 2014-12-11 ENCOUNTER — Other Ambulatory Visit (HOSPITAL_COMMUNITY)
Admission: RE | Admit: 2014-12-11 | Discharge: 2014-12-11 | Disposition: A | Discharge status: Home / Self Care | Payer: PPO | Source: Ambulatory Visit | Attending: Gynecology | Admitting: Gynecology

## 2014-12-11 ENCOUNTER — Encounter: Payer: Self-pay | Admitting: Gynecology

## 2014-12-11 ENCOUNTER — Ambulatory Visit (INDEPENDENT_AMBULATORY_CARE_PROVIDER_SITE_OTHER): Payer: PPO | Admitting: Gynecology

## 2014-12-11 VITALS — BP 120/70 | Ht 63.0 in | Wt 126.0 lb

## 2014-12-11 DIAGNOSIS — Z1272 Encounter for screening for malignant neoplasm of vagina: Secondary | ICD-10-CM

## 2014-12-11 DIAGNOSIS — Z01419 Encounter for gynecological examination (general) (routine) without abnormal findings: Secondary | ICD-10-CM

## 2014-12-11 DIAGNOSIS — Z79890 Hormone replacement therapy (postmenopausal): Secondary | ICD-10-CM

## 2014-12-11 DIAGNOSIS — N952 Postmenopausal atrophic vaginitis: Secondary | ICD-10-CM

## 2014-12-11 DIAGNOSIS — M8580 Other specified disorders of bone density and structure, unspecified site: Secondary | ICD-10-CM

## 2014-12-11 MED ORDER — ESTRADIOL 1 MG PO TABS: 1.0000 mg | ORAL_TABLET | Freq: Every day | ORAL | Status: DC

## 2014-12-11 NOTE — Progress Notes (Signed)
Donna Guerra [birth year] [id number]        63 y.o.  G2P2002 for breast and pelvic exam. Several issues noted below.  Past medical history,surgical history, problem list, medications, allergies, family history and social history were all reviewed and documented as reviewed in the EPIC chart.  ROS:  Performed with pertinent positives and negatives included in the history, assessment and plan.   Additional significant findings :  none   Exam: [name] Counsellor Vitals:   12/11/14 1559  BP: 120/70  Height: _0  (1.6 m)  Weight: 126 lb (57.153 kg)   General appearance:  Normal affect, orientation and appearance. Skin: Grossly normal HEENT: Without gross lesions.  No cervical or supraclavicular adenopathy. Thyroid normal.  Lungs:  Clear without wheezing, rales or rhonchi Cardiac: RR, without RMG Abdominal:  Soft, nontender, without masses, guarding, rebound, organomegaly or hernia Breasts:  Examined lying and sitting without masses, retractions, discharge or axillary adenopathy. Pelvic:  Ext/BUS/vagina with generalized atrophic changes. Pap smear of the cuff done  Adnexa  Without masses or tenderness    Anus and perineum  Normal   Rectovaginal  Normal sphincter tone without palpated masses or tenderness.    Assessment/Plan:  64 y.o. G25P2002 female for breast and pelvic exam.   1. Postmenopausal/atrophic genital changes/menopausal symptoms. [name] for bleeding, pelvic pain, leiomyoma and simple hyperplasia with adenomyosis.  Patient continues on Estrace 1 mg daily and estradiol vaginal cream [facility]  I again reviewed the risks to include stroke heart attack DVT and possible breast cancer. ACOG and NAMS statements for lowest dose for shortest period of time and trying to wean. Patient strongly wants to continue saying that she feels well on this and understands and accepts the risks. She definitely feels that she is obtaining benefit  from the vaginal estrogen boost above the oral estrogen.  Refill for both 1 year provided. 2. Pap smear 2013. Pap smear of vaginal cuff today. Review current screening guidelines. History of low-grade dysplasia in the past. Options to stop screening as she is status post hysterectomy for benign indications reviewed. Patient feels more comfortable with screening and I did a Pap smear today. 3. Mammography 04/2014. Continue with annual mammography. SBE monthly reviewed.  Strong family history of breast cancer. I again offer genetic counseling for BRCA testing and patient declined. She understands the implications and issues to include prophylactic mastectomy or intensive surveillance. 4. Osteopenia. DEXA 12/2013 T score -2.0. FRAX 6.7%/0.5%. Increased calcium vitamin D reviewed. Plan repeat next year at two-year interval. 5. Colonoscopy due this coming year at a 5 year interval and I reminded her to call and arrange this and she agrees to do so. 6. Health maintenance. No routine blood work done as this is done at her primary physician's office. Follow up in one year, sooner as needed.     Anastasio Auerbach MD, 4:46 PM 12/11/2014

## 2014-12-11 NOTE — Patient Instructions (Signed)
You may obtain a copy of any labs that were done today by logging onto MyChart as outlined in the instructions provided with your AVS (after visit summary). The office will not call with normal lab results but certainly if there are any significant abnormalities then we will contact you.   Health Maintenance, Female A healthy lifestyle and preventative care can promote health and wellness.  Maintain regular health, dental, and eye exams.  Eat a healthy diet. Foods like vegetables, fruits, whole grains, low-fat dairy products, and lean protein foods contain the nutrients you need without too many calories. Decrease your intake of foods high in solid fats, added sugars, and salt. Get information about a proper diet from your caregiver, if necessary.  Regular physical exercise is one of the most important things you can do for your health. Most adults should get at least 150 minutes of moderate-intensity exercise (any activity that increases your heart rate and causes you to sweat) each week. In addition, most adults need muscle-strengthening exercises on 2 or more days a week.   Maintain a healthy weight. The body mass index (BMI) is a screening tool to identify possible weight problems. It provides an estimate of body fat based on height and weight. Your caregiver can help determine your BMI, and can help you achieve or maintain a healthy weight. For adults 20 years and older:  A BMI below 18.5 is considered underweight.  A BMI of 18.5 to 24.9 is normal.  A BMI of 25 to 29.9 is considered overweight.  A BMI of 30 and above is considered obese.  Maintain normal blood lipids and cholesterol by exercising and minimizing your intake of saturated fat. Eat a balanced diet with plenty of fruits and vegetables. Blood tests for lipids and cholesterol should begin at age 61 and be repeated every 5 years. If your lipid or cholesterol levels are high, you are over 50, or you are a high risk for heart  disease, you may need your cholesterol levels checked more frequently.Ongoing high lipid and cholesterol levels should be treated with medicines if diet and exercise are not effective.  If you smoke, find out from your caregiver how to quit. If you do not use tobacco, do not start.  Lung cancer screening is recommended for adults aged 33 80 years who are at high risk for developing lung cancer because of a history of smoking. Yearly low-dose computed tomography (CT) is recommended for people who have at least a 30-pack-year history of smoking and are a current smoker or have quit within the past 15 years. A pack year of smoking is smoking an average of 1 pack of cigarettes a day for 1 year (for example: 1 pack a day for 30 years or 2 packs a day for 15 years). Yearly screening should continue until the smoker has stopped smoking for at least 15 years. Yearly screening should also be stopped for people who develop a health problem that would prevent them from having lung cancer treatment.  If you are pregnant, do not drink alcohol. If you are breastfeeding, be very cautious about drinking alcohol. If you are not pregnant and choose to drink alcohol, do not exceed 1 drink per day. One drink is considered to be 12 ounces (355 mL) of beer, 5 ounces (148 mL) of wine, or 1.5 ounces (44 mL) of liquor.  Avoid use of street drugs. Do not share needles with anyone. Ask for help if you need support or instructions about stopping  the use of drugs.  High blood pressure causes heart disease and increases the risk of stroke. Blood pressure should be checked at least every 1 to 2 years. Ongoing high blood pressure should be treated with medicines, if weight loss and exercise are not effective.  If you are 59 to 66 years old, ask your caregiver if you should take aspirin to prevent strokes.  Diabetes screening involves taking a blood sample to check your fasting blood sugar level. This should be done once every 3  years, after age 91, if you are within normal weight and without risk factors for diabetes. Testing should be considered at a younger age or be carried out more frequently if you are overweight and have at least 1 risk factor for diabetes.  Breast cancer screening is essential preventative care for women. You should practice "breast self-awareness." This means understanding the normal appearance and feel of your breasts and may include breast self-examination. Any changes detected, no matter how small, should be reported to a caregiver. Women in their 66s and 30s should have a clinical breast exam (CBE) by a caregiver as part of a regular health exam every 1 to 3 years. After age 101, women should have a CBE every year. Starting at age 100, women should consider having a mammogram (breast X-ray) every year. Women who have a family history of breast cancer should talk to their caregiver about genetic screening. Women at a high risk of breast cancer should talk to their caregiver about having an MRI and a mammogram every year.  Breast cancer gene (BRCA)-related cancer risk assessment is recommended for women who have family members with BRCA-related cancers. BRCA-related cancers include breast, ovarian, tubal, and peritoneal cancers. Having family members with these cancers may be associated with an increased risk for harmful changes (mutations) in the breast cancer genes BRCA1 and BRCA2. Results of the assessment will determine the need for genetic counseling and BRCA1 and BRCA2 testing.  The Pap test is a screening test for cervical cancer. Women should have a Pap test starting at age 57. Between ages 25 and 35, Pap tests should be repeated every 2 years. Beginning at age 37, you should have a Pap test every 3 years as long as the past 3 Pap tests have been normal. If you had a hysterectomy for a problem that was not cancer or a condition that could lead to cancer, then you no longer need Pap tests. If you are  between ages 50 and 76, and you have had normal Pap tests going back 10 years, you no longer need Pap tests. If you have had past treatment for cervical cancer or a condition that could lead to cancer, you need Pap tests and screening for cancer for at least 20 years after your treatment. If Pap tests have been discontinued, risk factors (such as a new sexual partner) need to be reassessed to determine if screening should be resumed. Some women have medical problems that increase the chance of getting cervical cancer. In these cases, your caregiver may recommend more frequent screening and Pap tests.  The human papillomavirus (HPV) test is an additional test that may be used for cervical cancer screening. The HPV test looks for the virus that can cause the cell changes on the cervix. The cells collected during the Pap test can be tested for HPV. The HPV test could be used to screen women aged 44 years and older, and should be used in women of any age  who have unclear Pap test results. After the age of 55, women should have HPV testing at the same frequency as a Pap test.  Colorectal cancer can be detected and often prevented. Most routine colorectal cancer screening begins at the age of 44 and continues through age 20. However, your caregiver may recommend screening at an earlier age if you have risk factors for colon cancer. On a yearly basis, your caregiver may provide home test kits to check for hidden blood in the stool. Use of a small camera at the end of a tube, to directly examine the colon (sigmoidoscopy or colonoscopy), can detect the earliest forms of colorectal cancer. Talk to your caregiver about this at age 86, when routine screening begins. Direct examination of the colon should be repeated every 5 to 10 years through age 13, unless early forms of pre-cancerous polyps or small growths are found.  Hepatitis C blood testing is recommended for all people born from [birth year] through 1967 and any  individual with known risks for hepatitis C.  Practice safe sex. Use condoms and avoid high-risk sexual practices to reduce the spread of sexually transmitted infections (STIs). Sexually active women aged 36 and younger should be checked for Chlamydia, which is a common sexually transmitted infection. Older women with new or multiple partners should also be tested for Chlamydia. Testing for other STIs is recommended if you are sexually active and at increased risk.  Osteoporosis is a disease in which the bones lose minerals and strength with aging. This can result in serious bone fractures. The risk of osteoporosis can be identified using a bone density scan. Women ages 20 and over and women at risk for fractures or osteoporosis should discuss screening with their caregivers. Ask your caregiver whether you should be taking a calcium supplement or vitamin D to reduce the rate of osteoporosis.  Menopause can be associated with physical symptoms and risks. Hormone replacement therapy is available to decrease symptoms and risks. You should talk to your caregiver about whether hormone replacement therapy is right for you.  Use sunscreen. Apply sunscreen liberally and repeatedly throughout the day. You should seek shade when your shadow is shorter than you. Protect yourself by wearing long sleeves, pants, a wide-brimmed hat, and sunglasses year round, whenever you are outdoors.  Notify your caregiver of new moles or changes in moles, especially if there is a change in shape or color. Also notify your caregiver if a mole is larger than the size of a pencil eraser.  Stay current with your immunizations. Document Released: 04/18/2011 Document Revised: 01/28/2013 Document Reviewed: 04/18/2011 [facility] Patient Information 2014 [employer].

## 2014-12-12 LAB — URINALYSIS W MICROSCOPIC + REFLEX CULTURE
Bacteria, UA: NONE SEEN
Bilirubin Urine: NEGATIVE
CASTS: NONE SEEN
Crystals: NONE SEEN
Glucose, UA: NEGATIVE mg/dL
HGB URINE DIPSTICK: NEGATIVE
Ketones, ur: NEGATIVE mg/dL
LEUKOCYTES UA: NEGATIVE
NITRITE: NEGATIVE
PH: 5 (ref 5.0–8.0)
Protein, ur: NEGATIVE mg/dL
Specific Gravity, Urine: 1.022 (ref 1.005–1.030)
UROBILINOGEN UA: 0.2 mg/dL (ref 0.0–1.0)

## 2014-12-15 LAB — CYTOLOGY - PAP

## 2014-12-29 ENCOUNTER — Ambulatory Visit (INDEPENDENT_AMBULATORY_CARE_PROVIDER_SITE_OTHER): Payer: PPO | Admitting: Family Medicine

## 2014-12-29 ENCOUNTER — Encounter: Payer: Self-pay | Admitting: Family Medicine

## 2014-12-29 VITALS — BP 120/76 | HR 79 | Temp 98.0°F | Wt 125.0 lb

## 2014-12-29 DIAGNOSIS — E785 Hyperlipidemia, unspecified: Secondary | ICD-10-CM

## 2014-12-29 DIAGNOSIS — R739 Hyperglycemia, unspecified: Secondary | ICD-10-CM | POA: Diagnosis not present

## 2014-12-29 DIAGNOSIS — G43809 Other migraine, not intractable, without status migrainosus: Secondary | ICD-10-CM

## 2014-12-29 DIAGNOSIS — M8580 Other specified disorders of bone density and structure, unspecified site: Secondary | ICD-10-CM | POA: Diagnosis not present

## 2014-12-29 DIAGNOSIS — E782 Mixed hyperlipidemia: Secondary | ICD-10-CM

## 2014-12-29 DIAGNOSIS — Z0000 Encounter for general adult medical examination without abnormal findings: Secondary | ICD-10-CM

## 2014-12-29 MED ORDER — SUMATRIPTAN SUCCINATE 100 MG PO TABS: 100.0000 mg | ORAL_TABLET | ORAL | Status: DC

## 2014-12-29 NOTE — Progress Notes (Signed)
Pre visit review using our clinic review tool, if applicable. No additional management support is needed unless otherwise documented below in the visit note. 

## 2014-12-29 NOTE — Patient Instructions (Signed)

## 2014-12-30 LAB — COMPREHENSIVE METABOLIC PANEL
ALT: 18 U/L (ref 0–35)
AST: 17 U/L (ref 0–37)
Albumin: 4.4 g/dL (ref 3.5–5.2)
Alkaline Phosphatase: 79 U/L (ref 39–117)
BUN: 22 mg/dL (ref 6–23)
CALCIUM: 10 mg/dL (ref 8.4–10.5)
CO2: 30 meq/L (ref 19–32)
CREATININE: 0.77 mg/dL (ref 0.40–1.20)
Chloride: 104 mEq/L (ref 96–112)
GFR: 79.78 mL/min (ref >60.00)
Glucose, Bld: 101 mg/dL — ABNORMAL HIGH (ref 70–99)
Potassium: 3.8 mEq/L (ref 3.5–5.1)
SODIUM: 138 meq/L (ref 135–145)
Total Bilirubin: 0.4 mg/dL (ref 0.2–1.2)
Total Protein: 6.9 g/dL (ref 6.0–8.3)

## 2014-12-30 LAB — LIPID PANEL
CHOL/HDL RATIO: 3
Cholesterol: 162 mg/dL (ref 0–200)
HDL: 58.1 mg/dL (ref >39.00)
LDL CALC: 74 mg/dL (ref 0–99)
NonHDL: 103.9
TRIGLYCERIDES: 151 mg/dL — AB (ref 0.0–149.0)
VLDL: 30.2 mg/dL (ref 0.0–40.0)

## 2014-12-30 LAB — CBC
HCT: 38.9 % (ref 36.0–46.0)
Hemoglobin: 13.5 g/dL (ref 12.0–15.0)
MCHC: 34.8 g/dL (ref 30.0–36.0)
MCV: 95.7 fl (ref 78.0–100.0)
PLATELETS: 264 10*3/uL (ref 150.0–400.0)
RBC: 4.07 Mil/uL (ref 3.87–5.11)
RDW: 13 % (ref 11.5–15.5)
WBC: 8.6 10*3/uL (ref 4.0–10.5)

## 2014-12-30 LAB — TSH: TSH: 1.39 u[IU]/mL (ref 0.35–4.50)

## 2014-12-30 LAB — VITAMIN D 25 HYDROXY (VIT D DEFICIENCY, FRACTURES): VITD: 34.91 ng/mL (ref 30.00–100.00)

## 2015-01-04 ENCOUNTER — Encounter: Payer: Self-pay | Admitting: Family Medicine

## 2015-01-04 NOTE — Assessment & Plan Note (Signed)
Tolerating statin, encouraged heart healthy diet, avoid trans fats, minimize simple carbs and saturated fats. Increase exercise as tolerated 

## 2015-01-04 NOTE — Assessment & Plan Note (Signed)
Infrequent, Encouraged increased hydration, 64 ounces of clear fluids daily. Minimize alcohol and caffeine. Eat small frequent meals with lean proteins and complex carbs. Avoid high and low blood sugars. Get adequate sleep, 7-8 hours a night. Needs exercise daily preferably in the morning. Given refill on Imitrex

## 2015-01-04 NOTE — Progress Notes (Signed)
Donna Guerra  [id number] April 10, 1951 01/04/2015      Progress Note-Follow Up  Subjective  Chief Complaint  Chief Complaint  Patient presents with  . Follow-up    med refills lipitor and imitrex    HPI  Patient is a 64 y.o. female in today for routine medical care. Patient is in today to continue care. Feels well. Does continue to have migraines intermittently and needs a refill on her Imitrex which works well. She denies any concerning neurologic symptoms. No recent illness. Denies CP/palp/SOB/HA/congestion/fevers/GI or GU c/o. Taking meds as prescribed  Past Medical History  Diagnosis Date  . CIN I (cervical intraepithelial neoplasia I) 2005  . Osteopenia 12/2013    T score -2.0 FRAX 6.7%/0.5%  . Migraines   . Right shoulder pain 07/08/2012  . Colon polyps 07/08/2012  . Hyperlipidemia 08/29/2012  . Skin cancer 07/08/2012    Basal cell    Past Surgical History  Procedure Laterality Date  . Cholecystectomy    . Tubal ligation    . Oophorectomy  2002    BSO  . Shoulder surgery      right rotator cuff repair  . Colposcopy    . Gynecologic cryosurgery    . Cesarean section  1978 and 1981  . Tonsillectomy and adenoidectomy      age 7  . Vaginal hysterectomy  2002    [facility] BSO  . Abdominal surgery      Tummy tuck    Family History  Problem Relation Age of Onset  . Hypertension Mother   . Breast cancer Mother 66  . Cancer Mother 33    breast  . Breast cancer Maternal Aunt 80  . Breast cancer Maternal Aunt     50's  . Migraines Father   . Migraines Sister   . Diabetes Maternal Grandmother     History   Social History  . Marital Status: Married    Spouse Name: N/A  . Number of Children: N/A  . Years of Education: N/A   Occupational History  . Not on file.   Social History Main Topics  . Smoking status: Never Smoker   . Smokeless tobacco: Never Used  . Alcohol Use: 0.0 oz/week    0 Standard drinks or equivalent per week     Comment: occasionally    . Drug Use: No  . Sexual Activity:    Partners: Male    Birth Control/ Protection: Surgical     Comment: HYST-1st intercourse 64 yo-Fewer than 5 partners   Other Topics Concern  . Not on file   Social History Narrative    Current Outpatient Prescriptions on File Prior to Visit  Medication Sig Dispense Refill  . atorvastatin (LIPITOR) 40 MG tablet Take 1 tablet (40 mg total) by mouth daily. 30 tablet 11  . Cholecalciferol (VITAMIN D PO) Take 2,000 Units by mouth.    . estradiol (ESTRACE) 1 MG tablet Take 1 tablet (1 mg total) by mouth daily. 30 tablet 12  . fish oil-omega-3 fatty acids 1000 MG capsule Take 1 g by mouth daily.    . NONFORMULARY OR COMPOUNDED ITEM Estradiol vaginal 0.02% cream insert twice weekly vaginally 90 each 3   No current facility-administered medications on file prior to visit.    No Known Allergies  Review of Systems  Review of Systems  Constitutional: Negative for fever and malaise/fatigue.  HENT: Negative for congestion.   Eyes: Negative for discharge.  Respiratory: Negative for shortness of breath.  Cardiovascular: Negative for chest pain, palpitations and leg swelling.  Gastrointestinal: Negative for nausea, abdominal pain and diarrhea.  Genitourinary: Negative for dysuria.  Musculoskeletal: Negative for falls.  Skin: Negative for rash.  Neurological: Negative for loss of consciousness and headaches.  Endo/Heme/Allergies: Negative for polydipsia.  Psychiatric/Behavioral: Negative for depression and suicidal ideas. The patient is not nervous/anxious and does not have insomnia.     Objective  BP 120/76 mmHg  Pulse 79  Temp(Src) 98 F (36.7 C)  Wt 125 lb (56.7 kg)  SpO2 98%  Physical Exam  Physical Exam  Constitutional: She is oriented to person, place, and time and well-developed, well-nourished, and in no distress. No distress.  HENT:  Head: Normocephalic and atraumatic.  Eyes: Conjunctivae are normal.  Neck: Neck supple. No  thyromegaly present.  Cardiovascular: Normal rate, regular rhythm and normal heart sounds.   No murmur heard. Pulmonary/Chest: Effort normal and breath sounds normal. She has no wheezes.  Abdominal: She exhibits no distension and no mass.  Musculoskeletal: She exhibits no edema.  Lymphadenopathy:    She has no cervical adenopathy.  Neurological: She is alert and oriented to person, place, and time.  Skin: Skin is warm and dry. No rash noted. She is not diaphoretic.  Psychiatric: Memory, affect and judgment normal.    Lab Results  Component Value Date   TSH 1.39 12/29/2014   Lab Results  Component Value Date   WBC 8.6 12/29/2014   HGB 13.5 12/29/2014   HCT 38.9 12/29/2014   MCV 95.7 12/29/2014   PLT 264.0 12/29/2014   Lab Results  Component Value Date   CREATININE 0.77 12/29/2014   BUN 22 12/29/2014   NA 138 12/29/2014   K 3.8 12/29/2014   CL 104 12/29/2014   CO2 30 12/29/2014   Lab Results  Component Value Date   ALT 18 12/29/2014   AST 17 12/29/2014   ALKPHOS 79 12/29/2014   BILITOT 0.4 12/29/2014   Lab Results  Component Value Date   CHOL 162 12/29/2014   Lab Results  Component Value Date   HDL 58.10 12/29/2014   Lab Results  Component Value Date   LDLCALC 74 12/29/2014   Lab Results  Component Value Date   TRIG 151.0* 12/29/2014   Lab Results  Component Value Date   CHOLHDL 3 12/29/2014     Assessment & Plan  Migraines Infrequent, Encouraged increased hydration, 64 ounces of clear fluids daily. Minimize alcohol and caffeine. Eat small frequent meals with lean proteins and complex carbs. Avoid high and low blood sugars. Get adequate sleep, 7-8 hours a night. Needs exercise daily preferably in the morning. Given refill on Imitrex   Hyperlipidemia Tolerating statin, encouraged heart healthy diet, avoid trans fats, minimize simple carbs and saturated fats. Increase exercise as tolerated   Osteopenia Encouraged calcium in diet tid and vitamin d  supplements. Maintain physical activity

## 2015-01-04 NOTE — Assessment & Plan Note (Signed)
Encouraged calcium in diet tid and vitamin d supplements. Maintain physical activity

## 2015-01-30 ENCOUNTER — Telehealth: Payer: Self-pay | Admitting: Family Medicine

## 2015-01-30 DIAGNOSIS — E785 Hyperlipidemia, unspecified: Secondary | ICD-10-CM

## 2015-01-30 MED ORDER — ATORVASTATIN CALCIUM 40 MG PO TABS: 40.0000 mg | ORAL_TABLET | Freq: Every day | ORAL | Status: DC

## 2015-01-30 NOTE — Telephone Encounter (Signed)
Called left message to call back 

## 2015-01-30 NOTE — Telephone Encounter (Signed)
Caller name: Estelita Relation to pt: self Call back number: [phone] Pharmacy:  Reason for call:   Patient is requesting labs from last month. Says that no one has called yet with results

## 2015-01-30 NOTE — Telephone Encounter (Signed)
Patient informed of results and did mail her a copy as well.. Also sent in refill on her Atorvastatin to [facility].

## 2015-02-16 ENCOUNTER — Other Ambulatory Visit: Payer: Self-pay

## 2015-02-16 MED ORDER — NONFORMULARY OR COMPOUNDED ITEM: Status: DC

## 2015-02-17 NOTE — Telephone Encounter (Signed)
Rx called in 

## 2015-03-23 ENCOUNTER — Other Ambulatory Visit: Payer: Self-pay

## 2015-03-23 DIAGNOSIS — Z1231 Encounter for screening mammogram for malignant neoplasm of breast: Secondary | ICD-10-CM

## 2015-03-31 ENCOUNTER — Ambulatory Visit: Payer: PPO | Admitting: Family Medicine

## 2015-04-03 ENCOUNTER — Ambulatory Visit: Payer: PPO | Admitting: Family Medicine

## 2015-04-03 ENCOUNTER — Ambulatory Visit (INDEPENDENT_AMBULATORY_CARE_PROVIDER_SITE_OTHER): Payer: PPO | Admitting: Family Medicine

## 2015-04-03 ENCOUNTER — Encounter: Payer: Self-pay | Admitting: Family Medicine

## 2015-04-03 VITALS — BP 116/74 | HR 74 | Temp 98.0°F | Ht 63.5 in | Wt 127.0 lb

## 2015-04-03 DIAGNOSIS — Z0000 Encounter for general adult medical examination without abnormal findings: Secondary | ICD-10-CM

## 2015-04-03 DIAGNOSIS — G43809 Other migraine, not intractable, without status migrainosus: Secondary | ICD-10-CM

## 2015-04-03 DIAGNOSIS — E785 Hyperlipidemia, unspecified: Secondary | ICD-10-CM | POA: Diagnosis not present

## 2015-04-03 DIAGNOSIS — M8580 Other specified disorders of bone density and structure, unspecified site: Secondary | ICD-10-CM | POA: Diagnosis not present

## 2015-04-03 DIAGNOSIS — R1310 Dysphagia, unspecified: Secondary | ICD-10-CM

## 2015-04-03 NOTE — Patient Instructions (Signed)
[employer], [place] or online [email or web address] company makes a 10 strain probiotic  Preventive Care for Adults A healthy lifestyle and preventive care can promote health and wellness. Preventive health guidelines for women include the following key practices. A routine yearly physical is a good way to check with your health care provider about your health and preventive screening. It is a chance to share any concerns and updates on your health and to receive a thorough exam. Visit your dentist for a routine exam and preventive care every 6 months. Brush your teeth twice a day and floss once a day. Good oral hygiene prevents tooth decay and gum disease. The frequency of eye exams is based on your age, health, family medical history, use of contact lenses, and other factors. Follow your health care provider's recommendations for frequency of eye exams. Eat a healthy diet. Foods like vegetables, fruits, whole grains, low-fat dairy products, and lean protein foods contain the nutrients you need without too many calories. Decrease your intake of foods high in solid fats, added sugars, and salt. Eat the right amount of calories for you.Get information about a proper diet from your health care provider, if necessary. Regular physical exercise is one of the most important things you can do for your health. Most adults should get at least 150 minutes of moderate-intensity exercise (any activity that increases your heart rate and causes you to sweat) each week. In addition, most adults need muscle-strengthening exercises on 2 or more days a week. Maintain a healthy weight. The body mass index (BMI) is a screening tool to identify possible weight problems. It provides an estimate of body fat based on height and weight. Your health care provider can find your BMI and can help you achieve or maintain a healthy weight.For adults 20 years and older: A BMI below 18.5 is considered  underweight. A BMI of 18.5 to 24.9 is normal. A BMI of 25 to 29.9 is considered overweight. A BMI of 30 and above is considered obese. Maintain normal blood lipids and cholesterol levels by exercising and minimizing your intake of saturated fat. Eat a balanced diet with plenty of fruit and vegetables. Blood tests for lipids and cholesterol should begin at age 51 and be repeated every 5 years. If your lipid or cholesterol levels are high, you are over 50, or you are at high risk for heart disease, you may need your cholesterol levels checked more frequently.Ongoing high lipid and cholesterol levels should be treated with medicines if diet and exercise are not working. If you smoke, find out from your health care provider how to quit. If you do not use tobacco, do not start. Lung cancer screening is recommended for adults aged 79-80 years who are at high risk for developing lung cancer because of a history of smoking. A yearly low-dose CT scan of the lungs is recommended for people who have at least a 30-pack-year history of smoking and are a current smoker or have quit within the past 15 years. A pack year of smoking is smoking an average of 1 pack of cigarettes a day for 1 year (for example: 1 pack a day for 30 years or 2 packs a day for 15 years). Yearly screening should continue until the smoker has stopped smoking for at least 15 years. Yearly screening should be stopped for people who develop a health problem that would prevent them from having lung cancer treatment. If you are pregnant, do not drink alcohol. If  you are breastfeeding, be very cautious about drinking alcohol. If you are not pregnant and choose to drink alcohol, do not have more than 1 drink per day. One drink is considered to be 12 ounces (355 mL) of beer, 5 ounces (148 mL) of wine, or 1.5 ounces (44 mL) of liquor. Avoid use of street drugs. Do not share needles with anyone. Ask for help if you need support or instructions about stopping  the use of drugs. High blood pressure causes heart disease and increases the risk of stroke. Your blood pressure should be checked at least every 1 to 2 years. Ongoing high blood pressure should be treated with medicines if weight loss and exercise do not work. If you are 1-32 years old, ask your health care provider if you should take aspirin to prevent strokes. Diabetes screening involves taking a blood sample to check your fasting blood sugar level. This should be done once every 3 years, after age 28, if you are within normal weight and without risk factors for diabetes. Testing should be considered at a younger age or be carried out more frequently if you are overweight and have at least 1 risk factor for diabetes. Breast cancer screening is essential preventive care for women. You should practice "breast self-awareness." This means understanding the normal appearance and feel of your breasts and may include breast self-examination. Any changes detected, no matter how small, should be reported to a health care provider. Women in their 51s and 30s should have a clinical breast exam (CBE) by a health care provider as part of a regular health exam every 1 to 3 years. After age 46, women should have a CBE every year. Starting at age 71, women should consider having a mammogram (breast X-ray test) every year. Women who have a family history of breast cancer should talk to their health care provider about genetic screening. Women at a high risk of breast cancer should talk to their health care providers about having an MRI and a mammogram every year. Breast cancer gene (BRCA)-related cancer risk assessment is recommended for women who have family members with BRCA-related cancers. BRCA-related cancers include breast, ovarian, tubal, and peritoneal cancers. Having family members with these cancers may be associated with an increased risk for harmful changes (mutations) in the breast cancer genes BRCA1 and BRCA2.  Results of the assessment will determine the need for genetic counseling and BRCA1 and BRCA2 testing. Routine pelvic exams to screen for cancer are no longer recommended for nonpregnant women who are considered low risk for cancer of the pelvic organs (ovaries, uterus, and vagina) and who do not have symptoms. Ask your health care provider if a screening pelvic exam is right for you. If you have had past treatment for cervical cancer or a condition that could lead to cancer, you need Pap tests and screening for cancer for at least 20 years after your treatment. If Pap tests have been discontinued, your risk factors (such as having a new sexual partner) need to be reassessed to determine if screening should be resumed. Some women have medical problems that increase the chance of getting cervical cancer. In these cases, your health care provider may recommend more frequent screening and Pap tests. The HPV test is an additional test that may be used for cervical cancer screening. The HPV test looks for the virus that can cause the cell changes on the cervix. The cells collected during the Pap test can be tested for HPV. The HPV test  could be used to screen women aged 62 years and older, and should be used in women of any age who have unclear Pap test results. After the age of 12, women should have HPV testing at the same frequency as a Pap test. Colorectal cancer can be detected and often prevented. Most routine colorectal cancer screening begins at the age of 66 years and continues through age 58 years. However, your health care provider may recommend screening at an earlier age if you have risk factors for colon cancer. On a yearly basis, your health care provider may provide home test kits to check for hidden blood in the stool. Use of a small camera at the end of a tube, to directly examine the colon (sigmoidoscopy or colonoscopy), can detect the earliest forms of colorectal cancer. Talk to your health care  provider about this at age 58, when routine screening begins. Direct exam of the colon should be repeated every 5-10 years through age 74 years, unless early forms of pre-cancerous polyps or small growths are found. People who are at an increased risk for hepatitis B should be screened for this virus. You are considered at high risk for hepatitis B if: You were born in a country where hepatitis B occurs often. Talk with your health care provider about which countries are considered high risk. Your parents were born in a high-risk country and you have not received a shot to protect against hepatitis B (hepatitis B vaccine). You have HIV or AIDS. You use needles to inject street drugs. You live with, or have sex with, someone who has hepatitis B. You get hemodialysis treatment. You take certain medicines for conditions like cancer, organ transplantation, and autoimmune conditions. Hepatitis C blood testing is recommended for all people born from [birth year] through 1965 and any individual with known risks for hepatitis C. Practice safe sex. Use condoms and avoid high-risk sexual practices to reduce the spread of sexually transmitted infections (STIs). STIs include gonorrhea, chlamydia, syphilis, trichomonas, herpes, HPV, and human immunodeficiency virus (HIV). Herpes, HIV, and HPV are viral illnesses that have no cure. They can result in disability, cancer, and death. You should be screened for sexually transmitted illnesses (STIs) including gonorrhea and chlamydia if: You are sexually active and are younger than 24 years. You are older than 24 years and your health care provider tells you that you are at risk for this type of infection. Your sexual activity has changed since you were last screened and you are at an increased risk for chlamydia or gonorrhea. Ask your health care provider if you are at risk. If you are at risk of being infected with HIV, it is recommended that you take a prescription medicine  daily to prevent HIV infection. This is called preexposure prophylaxis (PrEP). You are considered at risk if: You are a heterosexual woman, are sexually active, and are at increased risk for HIV infection. You take drugs by injection. You are sexually active with a partner who has HIV. Talk with your health care provider about whether you are at high risk of being infected with HIV. If you choose to begin PrEP, you should first be tested for HIV. You should then be tested every 3 months for as long as you are taking PrEP. Osteoporosis is a disease in which the bones lose minerals and strength with aging. This can result in serious bone fractures or breaks. The risk of osteoporosis can be identified using a bone density scan. Women ages 92 years  and over and women at risk for fractures or osteoporosis should discuss screening with their health care providers. Ask your health care provider whether you should take a calcium supplement or vitamin D to reduce the rate of osteoporosis. Menopause can be associated with physical symptoms and risks. Hormone replacement therapy is available to decrease symptoms and risks. You should talk to your health care provider about whether hormone replacement therapy is right for you. Use sunscreen. Apply sunscreen liberally and repeatedly throughout the day. You should seek shade when your shadow is shorter than you. Protect yourself by wearing long sleeves, pants, a wide-brimmed hat, and sunglasses year round, whenever you are outdoors. Once a month, do a whole body skin exam, using a mirror to look at the skin on your back. Tell your health care provider of new moles, moles that have irregular borders, moles that are larger than a pencil eraser, or moles that have changed in shape or color. Stay current with required vaccines (immunizations). Influenza vaccine. All adults should be immunized every year. Tetanus, diphtheria, and acellular pertussis (Td, Tdap) vaccine.  Pregnant women should receive 1 dose of Tdap vaccine during each pregnancy. The dose should be obtained regardless of the length of time since the last dose. Immunization is preferred during the 27th-36th week of gestation. An adult who has not previously received Tdap or who does not know her vaccine status should receive 1 dose of Tdap. This initial dose should be followed by tetanus and diphtheria toxoids (Td) booster doses every 10 years. Adults with an unknown or incomplete history of completing a 3-dose immunization series with Td-containing vaccines should begin or complete a primary immunization series including a Tdap dose. Adults should receive a Td booster every 10 years. Varicella vaccine. An adult without evidence of immunity to varicella should receive 2 doses or a second dose if she has previously received 1 dose. Pregnant females who do not have evidence of immunity should receive the first dose after pregnancy. This first dose should be obtained before leaving the health care facility. The second dose should be obtained 4-8 weeks after the first dose. Human papillomavirus (HPV) vaccine. Females aged 13-26 years who have not received the vaccine previously should obtain the 3-dose series. The vaccine is not recommended for use in pregnant females. However, pregnancy testing is not needed before receiving a dose. If a female is found to be pregnant after receiving a dose, no treatment is needed. In that case, the remaining doses should be delayed until after the pregnancy. Immunization is recommended for any person with an immunocompromised condition through the age of 72 years if she did not get any or all doses earlier. During the 3-dose series, the second dose should be obtained 4-8 weeks after the first dose. The third dose should be obtained 24 weeks after the first dose and 16 weeks after the second dose. Zoster vaccine. One dose is recommended for adults aged 27 years or older unless certain  conditions are present. Measles, mumps, and rubella (MMR) vaccine. Adults born before [birth year] generally are considered immune to measles and mumps. Adults born in [birth year] or later should have 1 or more doses of MMR vaccine unless there is a contraindication to the vaccine or there is laboratory evidence of immunity to each of the three diseases. A routine second dose of MMR vaccine should be obtained at least 28 days after the first dose for students attending postsecondary schools, health care workers, or international travelers. People who received  inactivated measles vaccine or an unknown type of measles vaccine during 1963-1967 should receive 2 doses of MMR vaccine. People who received inactivated mumps vaccine or an unknown type of mumps vaccine before 1979 and are at high risk for mumps infection should consider immunization with 2 doses of MMR vaccine. For females of childbearing age, rubella immunity should be determined. If there is no evidence of immunity, females who are not pregnant should be vaccinated. If there is no evidence of immunity, females who are pregnant should delay immunization until after pregnancy. Unvaccinated health care workers born before [birth year] who lack laboratory evidence of measles, mumps, or rubella immunity or laboratory confirmation of disease should consider measles and mumps immunization with 2 doses of MMR vaccine or rubella immunization with 1 dose of MMR vaccine. Pneumococcal 13-valent conjugate (PCV13) vaccine. When indicated, a person who is uncertain of her immunization history and has no record of immunization should receive the PCV13 vaccine. An adult aged 49 years or older who has certain medical conditions and has not been previously immunized should receive 1 dose of PCV13 vaccine. This PCV13 should be followed with a dose of pneumococcal polysaccharide (PPSV23) vaccine. The PPSV23 vaccine dose should be obtained at least 8 weeks after the dose of PCV13 vaccine. An adult  aged 74 years or older who has certain medical conditions and previously received 1 or more doses of PPSV23 vaccine should receive 1 dose of PCV13. The PCV13 vaccine dose should be obtained 1 or more years after the last PPSV23 vaccine dose. Pneumococcal polysaccharide (PPSV23) vaccine. When PCV13 is also indicated, PCV13 should be obtained first. All adults aged 80 years and older should be immunized. An adult younger than age 37 years who has certain medical conditions should be immunized. Any person who resides in a nursing home or long-term care facility should be immunized. An adult smoker should be immunized. People with an immunocompromised condition and certain other conditions should receive both PCV13 and PPSV23 vaccines. People with human immunodeficiency virus (HIV) infection should be immunized as soon as possible after diagnosis. Immunization during chemotherapy or radiation therapy should be avoided. Routine use of PPSV23 vaccine is not recommended for American Indians, [place] Natives, or people younger than 65 years unless there are medical conditions that require PPSV23 vaccine. When indicated, people who have unknown immunization and have no record of immunization should receive PPSV23 vaccine. One-time revaccination 5 years after the first dose of PPSV23 is recommended for people aged 19-64 years who have chronic kidney failure, nephrotic syndrome, asplenia, or immunocompromised conditions. People who received 1-2 doses of PPSV23 before age 5 years should receive another dose of PPSV23 vaccine at age 76 years or later if at least 5 years have passed since the previous dose. Doses of PPSV23 are not needed for people immunized with PPSV23 at or after age 59 years. Meningococcal vaccine. Adults with asplenia or persistent complement component deficiencies should receive 2 doses of quadrivalent meningococcal conjugate (MenACWY-D) vaccine. The doses should be obtained at least 2 months apart.  Microbiologists working with certain meningococcal bacteria, [employer] recruits, people at risk during an outbreak, and people who travel to or live in countries with a high rate of meningitis should be immunized. A first-year college student up through age 46 years who is living in a residence hall should receive a dose if she did not receive a dose on or after her 16th birthday. Adults who have certain high-risk conditions should receive one or more doses of vaccine.  Hepatitis A vaccine. Adults who wish to be protected from this disease, have certain high-risk conditions, work with hepatitis A-infected animals, work in hepatitis A research labs, or travel to or work in countries with a high rate of hepatitis A should be immunized. Adults who were previously unvaccinated and who anticipate close contact with an international adoptee during the first 60 days after arrival in the [place] from a country with a high rate of hepatitis A should be immunized. Hepatitis B vaccine. Adults who wish to be protected from this disease, have certain high-risk conditions, may be exposed to blood or other infectious body fluids, are household contacts or sex partners of hepatitis B positive people, are clients or workers in certain care facilities, or travel to or work in countries with a high rate of hepatitis B should be immunized. Haemophilus influenzae type b (Hib) vaccine. A previously unvaccinated person with asplenia or sickle cell disease or having a scheduled splenectomy should receive 1 dose of Hib vaccine. Regardless of previous immunization, a recipient of a hematopoietic stem cell transplant should receive a 3-dose series 6-12 months after her successful transplant. Hib vaccine is not recommended for adults with HIV infection. Preventive Services / Frequency Ages 13 to 48 years Blood pressure check.** / Every 1 to 2 years. Lipid and cholesterol check.** / Every 5 years beginning at age 24. Clinical breast  exam.** / Every 3 years for women in their 52s and 32s. BRCA-related cancer risk assessment.** / For women who have family members with a BRCA-related cancer (breast, ovarian, tubal, or peritoneal cancers). Pap test.** / Every 2 years from ages 45 through 42. Every 3 years starting at age 48 through age 74 or 56 with a history of 3 consecutive normal Pap tests. HPV screening.** / Every 3 years from ages 66 through ages 64 to 50 with a history of 3 consecutive normal Pap tests. Hepatitis C blood test.** / For any individual with known risks for hepatitis C. Skin self-exam. / Monthly. Influenza vaccine. / Every year. Tetanus, diphtheria, and acellular pertussis (Tdap, Td) vaccine.** / Consult your health care provider. Pregnant women should receive 1 dose of Tdap vaccine during each pregnancy. 1 dose of Td every 10 years. Varicella vaccine.** / Consult your health care provider. Pregnant females who do not have evidence of immunity should receive the first dose after pregnancy. HPV vaccine. / 3 doses over 6 months, if 67 and younger. The vaccine is not recommended for use in pregnant females. However, pregnancy testing is not needed before receiving a dose. Measles, mumps, rubella (MMR) vaccine.** / You need at least 1 dose of MMR if you were born in 1957 or later. You may also need a 2nd dose. For females of childbearing age, rubella immunity should be determined. If there is no evidence of immunity, females who are not pregnant should be vaccinated. If there is no evidence of immunity, females who are pregnant should delay immunization until after pregnancy. Pneumococcal 13-valent conjugate (PCV13) vaccine.** / Consult your health care provider. Pneumococcal polysaccharide (PPSV23) vaccine.** / 1 to 2 doses if you smoke cigarettes or if you have certain conditions. Meningococcal vaccine.** / 1 dose if you are age 52 to 40 years and a Market researcher living in a residence hall, or have one of  several medical conditions, you need to get vaccinated against meningococcal disease. You may also need additional booster doses. Hepatitis A vaccine.** / Consult your health care provider. Hepatitis B vaccine.** / Consult your  health care provider. Haemophilus influenzae type b (Hib) vaccine.** / Consult your health care provider. Ages 31 to 72 years Blood pressure check.** / Every 1 to 2 years. Lipid and cholesterol check.** / Every 5 years beginning at age 40 years. Lung cancer screening. / Every year if you are aged 66-80 years and have a 30-pack-year history of smoking and currently smoke or have quit within the past 15 years. Yearly screening is stopped once you have quit smoking for at least 15 years or develop a health problem that would prevent you from having lung cancer treatment. Clinical breast exam.** / Every year after age 67 years. BRCA-related cancer risk assessment.** / For women who have family members with a BRCA-related cancer (breast, ovarian, tubal, or peritoneal cancers). Mammogram.** / Every year beginning at age 64 years and continuing for as long as you are in good health. Consult with your health care provider. Pap test.** / Every 3 years starting at age 57 years through age 85 or 16 years with a history of 3 consecutive normal Pap tests. HPV screening.** / Every 3 years from ages 71 years through ages 15 to 72 years with a history of 3 consecutive normal Pap tests. Fecal occult blood test (FOBT) of stool. / Every year beginning at age 17 years and continuing until age 32 years. You may not need to do this test if you get a colonoscopy every 10 years. Flexible sigmoidoscopy or colonoscopy.** / Every 5 years for a flexible sigmoidoscopy or every 10 years for a colonoscopy beginning at age 44 years and continuing until age 2 years. Hepatitis C blood test.** / For all people born from [birth year] through 1965 and any individual with known risks for hepatitis C. Skin self-exam. /  Monthly. Influenza vaccine. / Every year. Tetanus, diphtheria, and acellular pertussis (Tdap/Td) vaccine.** / Consult your health care provider. Pregnant women should receive 1 dose of Tdap vaccine during each pregnancy. 1 dose of Td every 10 years. Varicella vaccine.** / Consult your health care provider. Pregnant females who do not have evidence of immunity should receive the first dose after pregnancy. Zoster vaccine.** / 1 dose for adults aged 75 years or older. Measles, mumps, rubella (MMR) vaccine.** / You need at least 1 dose of MMR if you were born in 1957 or later. You may also need a 2nd dose. For females of childbearing age, rubella immunity should be determined. If there is no evidence of immunity, females who are not pregnant should be vaccinated. If there is no evidence of immunity, females who are pregnant should delay immunization until after pregnancy. Pneumococcal 13-valent conjugate (PCV13) vaccine.** / Consult your health care provider. Pneumococcal polysaccharide (PPSV23) vaccine.** / 1 to 2 doses if you smoke cigarettes or if you have certain conditions. Meningococcal vaccine.** / Consult your health care provider. Hepatitis A vaccine.** / Consult your health care provider. Hepatitis B vaccine.** / Consult your health care provider. Haemophilus influenzae type b (Hib) vaccine.** / Consult your health care provider. Ages 39 years and over Blood pressure check.** / Every 1 to 2 years. Lipid and cholesterol check.** / Every 5 years beginning at age 10 years. Lung cancer screening. / Every year if you are aged 63-80 years and have a 30-pack-year history of smoking and currently smoke or have quit within the past 15 years. Yearly screening is stopped once you have quit smoking for at least 15 years or develop a health problem that would prevent you from having lung cancer treatment. Clinical  breast exam.** / Every year after age 76 years. BRCA-related cancer risk assessment.** /  For women who have family members with a BRCA-related cancer (breast, ovarian, tubal, or peritoneal cancers). Mammogram.** / Every year beginning at age 2 years and continuing for as long as you are in good health. Consult with your health care provider. Pap test.** / Every 3 years starting at age 12 years through age 76 or 69 years with 3 consecutive normal Pap tests. Testing can be stopped between 65 and 70 years with 3 consecutive normal Pap tests and no abnormal Pap or HPV tests in the past 10 years. HPV screening.** / Every 3 years from ages 82 years through ages 47 or 75 years with a history of 3 consecutive normal Pap tests. Testing can be stopped between 65 and 70 years with 3 consecutive normal Pap tests and no abnormal Pap or HPV tests in the past 10 years. Fecal occult blood test (FOBT) of stool. / Every year beginning at age 59 years and continuing until age 10 years. You may not need to do this test if you get a colonoscopy every 10 years. Flexible sigmoidoscopy or colonoscopy.** / Every 5 years for a flexible sigmoidoscopy or every 10 years for a colonoscopy beginning at age 55 years and continuing until age 48 years. Hepatitis C blood test.** / For all people born from [birth year] through 1967 and any individual with known risks for hepatitis C. Osteoporosis screening.** / A one-time screening for women ages 35 years and over and women at risk for fractures or osteoporosis. Skin self-exam. / Monthly. Influenza vaccine. / Every year. Tetanus, diphtheria, and acellular pertussis (Tdap/Td) vaccine.** / 1 dose of Td every 10 years. Varicella vaccine.** / Consult your health care provider. Zoster vaccine.** / 1 dose for adults aged 55 years or older. Pneumococcal 13-valent conjugate (PCV13) vaccine.** / Consult your health care provider. Pneumococcal polysaccharide (PPSV23) vaccine.** / 1 dose for all adults aged 1 years and older. Meningococcal vaccine.** / Consult your health care  provider. Hepatitis A vaccine.** / Consult your health care provider. Hepatitis B vaccine.** / Consult your health care provider. Haemophilus influenzae type b (Hib) vaccine.** / Consult your health care provider. ** Family history and personal history of risk and conditions may change your health care provider's recommendations. Document Released: 11/29/2001 Document Revised: 02/17/2014 Document Reviewed: 02/28/2011 [facility] Patient Information 2015 [place], [place]. This information is not intended to replace advice given to you by your health care provider. Make sure you discuss any questions you have with your health care provider. Constipation Constipation is when a person has fewer than three bowel movements a week, has difficulty having a bowel movement, or has stools that are dry, hard, or larger than normal. As people grow older, constipation is more common. If you try to fix constipation with medicines that make you have a bowel movement (laxatives), the problem may get worse. Long-term laxative use may cause the muscles of the colon to become weak. A low-fiber diet, not taking in enough fluids, and taking certain medicines may make constipation worse.  CAUSES   Certain medicines, such as antidepressants, pain medicine, iron supplements, antacids, and water pills.   Certain diseases, such as diabetes, irritable bowel syndrome (IBS), thyroid disease, or depression.   Not drinking enough water.   Not eating enough fiber-rich foods.   Stress or travel.   Lack of physical activity or exercise.   Ignoring the urge to have a bowel movement.   Using laxatives  too much.  SIGNS AND SYMPTOMS   Having fewer than three bowel movements a week.   Straining to have a bowel movement.   Having stools that are hard, dry, or larger than normal.   Feeling full or bloated.   Pain in the lower abdomen.   Not feeling relief after having a bowel movement.  DIAGNOSIS  Your  health care provider will take a medical history and perform a physical exam. Further testing may be done for severe constipation. Some tests may include:  A barium enema X-ray to examine your rectum, colon, and, sometimes, your small intestine.   A sigmoidoscopy to examine your lower colon.   A colonoscopy to examine your entire colon. TREATMENT  Treatment will depend on the severity of your constipation and what is causing it. Some dietary treatments include drinking more fluids and eating more fiber-rich foods. Lifestyle treatments may include regular exercise. If these diet and lifestyle recommendations do not help, your health care provider may recommend taking over-the-counter laxative medicines to help you have bowel movements. Prescription medicines may be prescribed if over-the-counter medicines do not work.  HOME CARE INSTRUCTIONS   Eat foods that have a lot of fiber, such as fruits, vegetables, whole grains, and beans.  Limit foods high in fat and processed sugars, such as french fries, hamburgers, cookies, candies, and soda.   A fiber supplement may be added to your diet if you cannot get enough fiber from foods.   Drink enough fluids to keep your urine clear or pale yellow.   Exercise regularly or as directed by your health care provider.   Go to the restroom when you have the urge to go. Do not hold it.   Only take over-the-counter or prescription medicines as directed by your health care provider. Do not take other medicines for constipation without talking to your health care provider first.  [place] IF:   You have bright red blood in your stool.   Your constipation lasts for more than 4 days or gets worse.   You have abdominal or rectal pain.   You have thin, pencil-like stools.   You have unexplained weight loss. MAKE SURE YOU:   Understand these instructions.  Will watch your condition.  Will get help right away if you are  not doing well or get worse. Document Released: 07/01/2004 Document Revised: 10/08/2013 Document Reviewed: 07/15/2013 [facility] Patient Information 2015 [place], [place]. This information is not intended to replace advice given to you by your health care provider. Make sure you discuss any questions you have with your health care provider.

## 2015-04-03 NOTE — Assessment & Plan Note (Signed)
Encouraged vitamin D 2000 IU daily, 3 servings of calcium, regular exercise

## 2015-04-03 NOTE — Progress Notes (Signed)
Pre visit review using our clinic review tool, if applicable. No additional management support is needed unless otherwise documented below in the visit note. 

## 2015-04-06 ENCOUNTER — Encounter: Payer: Self-pay | Admitting: Family Medicine

## 2015-04-06 DIAGNOSIS — Z0000 Encounter for general adult medical examination without abnormal findings: Secondary | ICD-10-CM | POA: Insufficient documentation

## 2015-04-06 NOTE — Progress Notes (Signed)
Donna Guerra  [id number] [birth year] 04/06/2015      Progress Note-Follow Up  Subjective  Chief Complaint  Chief Complaint  Patient presents with  . Medicare Wellness    HPI  Patient is a 64 y.o. female in today for routine medical care. Patient is in today for annual exam and welcome to Medicare. She feels well. She's not had any recent illness. She does struggle with low-grade constipation but is managing that well. No bloody or tarry stool. Has been staying active and trying to maintain a heart healthy diet. Denies CP/palp/SOB/HA/congestion/fevers/GI or GU c/o. Taking meds as prescribed  Past Medical History  Diagnosis Date  . CIN I (cervical intraepithelial neoplasia I) 2005  . Osteopenia 12/2013    T score -2.0 FRAX 6.7%/0.5%  . Migraines   . Right shoulder pain 07/08/2012  . Colon polyps 07/08/2012  . Hyperlipidemia 08/29/2012  . Skin cancer 07/08/2012    Basal cell  . Medicare annual wellness visit, initial 04/06/2015    Past Surgical History  Procedure Laterality Date  . Cholecystectomy    . Tubal ligation    . Oophorectomy  2002    BSO  . Shoulder surgery      right rotator cuff repair  . Colposcopy    . Gynecologic cryosurgery    . Cesarean section  1978 and 1981  . Tonsillectomy and adenoidectomy      age 38  . Vaginal hysterectomy  2002    [facility] BSO  . Abdominal surgery      Tummy tuck    Family History  Problem Relation Age of Onset  . Hypertension Mother   . Breast cancer Mother 23  . Cancer Mother 70    breast  . Breast cancer Maternal Aunt 80  . Breast cancer Maternal Aunt     50's  . Migraines Father   . Migraines Sister   . Diabetes Maternal Grandmother     History   Social History  . Marital Status: Married    Spouse Name: N/A  . Number of Children: N/A  . Years of Education: N/A   Occupational History  . Not on file.   Social History Main Topics  . Smoking status: Never Smoker   . Smokeless tobacco: Never Used  . Alcohol  Use: 0.0 oz/week    0 Standard drinks or equivalent per week     Comment: occasionally  . Drug Use: No  . Sexual Activity:    Partners: Male    Birth Control/ Protection: Surgical     Comment: HYST-1st intercourse 64 yo-Fewer than 5 partners   Other Topics Concern  . Not on file   Social History Narrative    Current Outpatient Prescriptions on File Prior to Visit  Medication Sig Dispense Refill  . atorvastatin (LIPITOR) 40 MG tablet Take 1 tablet (40 mg total) by mouth daily. 30 tablet 11  . Cholecalciferol (VITAMIN D PO) Take 2,000 Units by mouth.    . estradiol (ESTRACE) 1 MG tablet Take 1 tablet (1 mg total) by mouth daily. 30 tablet 12  . fish oil-omega-3 fatty acids 1000 MG capsule Take 1 g by mouth daily.    . NONFORMULARY OR COMPOUNDED ITEM Estradiol vaginal 0.02% cream insert twice weekly vaginally 90 each 2  . SUMAtriptan (IMITREX) 100 MG tablet Take 1 tablet (100 mg total) by mouth as directed. 9 tablet 11   No current facility-administered medications on file prior to visit.    No Known Allergies  Review of Systems  Review of Systems  Constitutional: Negative for fever, chills and malaise/fatigue.  HENT: Negative for congestion, hearing loss and nosebleeds.   Eyes: Negative for discharge.  Respiratory: Negative for cough, sputum production, shortness of breath and wheezing.   Cardiovascular: Negative for chest pain, palpitations and leg swelling.  Gastrointestinal: Positive for constipation. Negative for heartburn, nausea, vomiting, abdominal pain, diarrhea and blood in stool.  Genitourinary: Negative for dysuria, urgency, frequency and hematuria.  Musculoskeletal: Negative for myalgias, back pain and falls.  Skin: Negative for rash.  Neurological: Negative for dizziness, tremors, sensory change, focal weakness, loss of consciousness, weakness and headaches.  Endo/Heme/Allergies: Negative for polydipsia. Does not bruise/bleed easily.  Psychiatric/Behavioral:  Negative for depression and suicidal ideas. The patient is not nervous/anxious and does not have insomnia.     Objective  BP 116/74 mmHg  Pulse 74  Temp(Src) 98 F (36.7 C) (Oral)  Ht 5' 3.5" (1.613 m)  Wt 127 lb (57.607 kg)  BMI 22.14 kg/m2  SpO2 100%  Physical Exam  Physical Exam  Constitutional: She is oriented to person, place, and time and well-developed, well-nourished, and in no distress. No distress.  HENT:  Head: Normocephalic and atraumatic.  Right Ear: External ear normal.  Left Ear: External ear normal.  Nose: Nose normal.  Mouth/Throat: Oropharynx is clear and moist. No oropharyngeal exudate.  Eyes: Conjunctivae are normal. Pupils are equal, round, and reactive to light. Right eye exhibits no discharge. Left eye exhibits no discharge. No scleral icterus.  Neck: Normal range of motion. Neck supple. No thyromegaly present.  Cardiovascular: Normal rate, regular rhythm, normal heart sounds and intact distal pulses.   No murmur heard. Pulmonary/Chest: Effort normal and breath sounds normal. No respiratory distress. She has no wheezes. She has no rales.  Abdominal: Soft. Bowel sounds are normal. She exhibits no distension and no mass. There is no tenderness.  Musculoskeletal: Normal range of motion. She exhibits no edema or tenderness.  Lymphadenopathy:    She has no cervical adenopathy.  Neurological: She is alert and oriented to person, place, and time. She has normal reflexes. No cranial nerve deficit. Coordination normal.  Skin: Skin is warm and dry. No rash noted. She is not diaphoretic.  Psychiatric: Mood, memory and affect normal.    Lab Results  Component Value Date   TSH 1.39 12/29/2014   Lab Results  Component Value Date   WBC 8.6 12/29/2014   HGB 13.5 12/29/2014   HCT 38.9 12/29/2014   MCV 95.7 12/29/2014   PLT 264.0 12/29/2014   Lab Results  Component Value Date   CREATININE 0.77 12/29/2014   BUN 22 12/29/2014   NA 138 12/29/2014   K 3.8  12/29/2014   CL 104 12/29/2014   CO2 30 12/29/2014   Lab Results  Component Value Date   ALT 18 12/29/2014   AST 17 12/29/2014   ALKPHOS 79 12/29/2014   BILITOT 0.4 12/29/2014   Lab Results  Component Value Date   CHOL 162 12/29/2014   Lab Results  Component Value Date   HDL 58.10 12/29/2014   Lab Results  Component Value Date   LDLCALC 74 12/29/2014   Lab Results  Component Value Date   TRIG 151.0* 12/29/2014   Lab Results  Component Value Date   CHOLHDL 3 12/29/2014     Assessment & Plan  Osteopenia Encouraged vitamin D 2000 IU daily, 3 servings of calcium, regular exercise  Migraines Encouraged increased hydration, 64 ounces of clear fluids daily.  Minimize alcohol and caffeine. Eat small frequent meals with lean proteins and complex carbs. Avoid high and low blood sugars. Get adequate sleep, 7-8 hours a night. Needs exercise daily preferably in the morning.  Preventative health care Patient encouraged to maintain heart healthy diet, regular exercise, adequate sleep. Consider daily probiotics. Take medications as prescribed  Hyperlipidemia Tolerating statin, encouraged heart healthy diet, avoid trans fats, minimize simple carbs and saturated fats. Increase exercise as tolerated  Medicare annual wellness visit, initial Patient denies any difficulties at home. No trouble with ADLs, depression or falls. No recent changes to vision or hearing. Is UTD with immunizations. Is UTD with screening. Discussed Advanced Directives, patient agrees to bring [place] copies of documents if can. Encouraged heart healthy diet, exercise as tolerated and adequate sleep. Labs reviewed. Follows with dermatology and gastroenterology Follows with Dr Phineas Real of GYN and they manage her Dexa scans, her mgm and pap See problem list to identify risk factors See AVS for screening purposes

## 2015-04-06 NOTE — Assessment & Plan Note (Signed)
Tolerating statin, encouraged heart healthy diet, avoid trans fats, minimize simple carbs and saturated fats. Increase exercise as tolerated 

## 2015-04-06 NOTE — Assessment & Plan Note (Signed)
Encouraged increased hydration, 64 ounces of clear fluids daily. Minimize alcohol and caffeine. Eat small frequent meals with lean proteins and complex carbs. Avoid high and low blood sugars. Get adequate sleep, 7-8 hours a night. Needs exercise daily preferably in the morning.  

## 2015-04-06 NOTE — Assessment & Plan Note (Signed)
Patient denies any difficulties at home. No trouble with ADLs, depression or falls. No recent changes to vision or hearing. Is UTD with immunizations. Is UTD with screening. Discussed Advanced Directives, patient agrees to bring [place] copies of documents if can. Encouraged heart healthy diet, exercise as tolerated and adequate sleep. Labs reviewed. Follows with dermatology and gastroenterology Follows with Dr Phineas Real of GYN and they manage her Dexa scans, her mgm and pap See problem list to identify risk factors See AVS for screening purposes

## 2015-04-06 NOTE — Assessment & Plan Note (Signed)
Patient encouraged to maintain heart healthy diet, regular exercise, adequate sleep. Consider daily probiotics. Take medications as prescribed 

## 2015-04-24 ENCOUNTER — Other Ambulatory Visit: Payer: Self-pay | Admitting: Gastroenterology

## 2015-04-24 LAB — HM COLONOSCOPY

## 2015-05-07 ENCOUNTER — Ambulatory Visit: Payer: PPO

## 2015-05-12 ENCOUNTER — Ambulatory Visit
Admission: RE | Admit: 2015-05-12 | Discharge: 2015-05-12 | Disposition: A | Discharge status: Home / Self Care | Payer: PPO | Source: Ambulatory Visit

## 2015-05-12 DIAGNOSIS — Z1231 Encounter for screening mammogram for malignant neoplasm of breast: Secondary | ICD-10-CM

## 2015-05-22 ENCOUNTER — Encounter: Payer: Self-pay | Admitting: Family Medicine

## 2015-05-22 LAB — HM COLONOSCOPY

## 2015-06-04 ENCOUNTER — Encounter: Payer: Self-pay | Admitting: Family Medicine

## 2015-09-29 ENCOUNTER — Other Ambulatory Visit: Payer: PPO

## 2015-10-06 ENCOUNTER — Ambulatory Visit: Payer: PPO | Admitting: Family Medicine

## 2015-11-03 DIAGNOSIS — X32XXXD Exposure to sunlight, subsequent encounter: Secondary | ICD-10-CM | POA: Diagnosis not present

## 2015-11-03 DIAGNOSIS — L570 Actinic keratosis: Secondary | ICD-10-CM | POA: Diagnosis not present

## 2015-11-03 DIAGNOSIS — L820 Inflamed seborrheic keratosis: Secondary | ICD-10-CM | POA: Diagnosis not present

## 2015-11-03 DIAGNOSIS — L821 Other seborrheic keratosis: Secondary | ICD-10-CM | POA: Diagnosis not present

## 2015-12-01 ENCOUNTER — Encounter: Payer: Self-pay | Admitting: Family Medicine

## 2015-12-01 ENCOUNTER — Ambulatory Visit (INDEPENDENT_AMBULATORY_CARE_PROVIDER_SITE_OTHER): Payer: PPO | Admitting: Family Medicine

## 2015-12-01 VITALS — BP 112/69 | HR 68 | Temp 98.1°F | Ht 64.0 in | Wt 123.0 lb

## 2015-12-01 DIAGNOSIS — M8580 Other specified disorders of bone density and structure, unspecified site: Secondary | ICD-10-CM

## 2015-12-01 DIAGNOSIS — G43809 Other migraine, not intractable, without status migrainosus: Secondary | ICD-10-CM

## 2015-12-01 DIAGNOSIS — E785 Hyperlipidemia, unspecified: Secondary | ICD-10-CM

## 2015-12-01 DIAGNOSIS — K2100 Gastro-esophageal reflux disease with esophagitis, without bleeding: Secondary | ICD-10-CM

## 2015-12-01 MED ORDER — ATORVASTATIN CALCIUM 40 MG PO TABS: 40.0000 mg | ORAL_TABLET | Freq: Every day | ORAL | Status: DC

## 2015-12-01 MED ORDER — SUMATRIPTAN SUCCINATE 100 MG PO TABS: 100.0000 mg | ORAL_TABLET | ORAL | Status: DC

## 2015-12-01 NOTE — Patient Instructions (Addendum)
NOW company probiotic 10 strains in 1 cap [email or web address]  Blue Zone  Cholesterol Cholesterol is a white, waxy, fat-like substance needed by your body in small amounts. The liver makes all the cholesterol you need. Cholesterol is carried from the liver by the blood through the blood vessels. Deposits of cholesterol (plaque) may build up on blood vessel walls. These make the arteries narrower and stiffer. Cholesterol plaques increase the risk for heart attack and stroke.  You cannot feel your cholesterol level even if it is very high. The only way to know it is high is with a blood test. Once you know your cholesterol levels, you should keep a record of the test results. Work with your health care provider to keep your levels in the desired range.  WHAT DO THE RESULTS MEAN?  Total cholesterol is a rough measure of all the cholesterol in your blood.   LDL is the so-called bad cholesterol. This is the type that deposits cholesterol in the walls of the arteries. You want this level to be low.   HDL is the good cholesterol because it cleans the arteries and carries the LDL away. You want this level to be high.  Triglycerides are fat that the body can either burn for energy or store. High levels are closely linked to heart disease.  WHAT ARE THE DESIRED LEVELS OF CHOLESTEROL?  Total cholesterol below 200.   LDL below 100 for people at risk, below 70 for those at very high risk.   HDL above 50 is good, above 60 is best.   Triglycerides below 150.  HOW CAN I LOWER MY CHOLESTEROL?  Diet. Follow your diet programs as directed by your health care provider.   Choose fish or white meat chicken and [place], roasted or baked. Limit fatty cuts of red meat, fried foods, and processed meats, such as sausage and lunch meats.   Eat lots of fresh fruits and vegetables.  Choose whole grains, beans, pasta, potatoes, and cereals.   Use only small amounts of olive, corn, or canola oils.    Avoid butter, mayonnaise, shortening, or palm kernel oils.  Avoid foods with trans fats.   Drink skim or nonfat milk and eat low-fat or nonfat yogurt and cheeses. Avoid whole milk, cream, ice cream, egg yolks, and full-fat cheeses.   Healthy desserts include angel food cake, ginger snaps, animal crackers, hard candy, popsicles, and low-fat or nonfat frozen yogurt. Avoid pastries, cakes, pies, and cookies.   Exercise. Follow your exercise programs as directed by your health care provider.   A regular program helps decrease LDL and raise HDL.   A regular program helps with weight control.   Do things that increase your activity level like gardening, walking, or taking the stairs. Ask your health care provider about how you can be more active in your daily life.   Medicine. Take medicine only as directed by your health care provider.   Medicine may be prescribed by your health care provider to help lower cholesterol and decrease the risk for heart disease.   If you have several risk factors, you may need medicine even if your levels are normal.   This information is not intended to replace advice given to you by your health care provider. Make sure you discuss any questions you have with your health care provider.   Document Released: 06/28/2001 Document Revised: 10/24/2014 Document Reviewed: 07/17/2013 Elsevier Interactive Patient Education [employer].

## 2015-12-01 NOTE — Progress Notes (Signed)
Pre visit review using our clinic review tool, if applicable. No additional management support is needed unless otherwise documented below in the visit note. 

## 2015-12-06 ENCOUNTER — Encounter: Payer: Self-pay | Admitting: Family Medicine

## 2015-12-06 NOTE — Assessment & Plan Note (Signed)
Tolerating statin, encouraged heart healthy diet, avoid trans fats, minimize simple carbs and saturated fats. Increase exercise as tolerated 

## 2015-12-06 NOTE — Assessment & Plan Note (Signed)
Bone density shows osteopenia. Recommend calcium intake of 1200 to 1500 mg daily, divided into roughly 3 doses. Best source is the diet and a single dairy serving is about 500 mg, a supplement of calcium citrate once or twice daily to balance diet is fine if not getting enough in diet. Also need Vitamin D 2000 IU caps, 1 cap daily if not already taking vitamin D. Also recommend weight baring exercise on hips and upper body to keep bones strong 

## 2015-12-06 NOTE — Progress Notes (Signed)
Patient ID: Donna Guerra, female   DOB: 1951/03/10, 65 y.o.   MRN: [id number]   Subjective:    Patient ID: Donna Guerra, female    DOB: 1951-01-22, 65 y.o.   MRN: [id number]  Chief Complaint  Patient presents with  . Medication Refill    HPI Patient is in today for follow up. Is feeling well, no recent illness. Denies any acute concerns. Tries to stay active. Denies CP/palp/SOB/HA/congestion/fevers/GI or GU c/o. Taking meds as prescribed  Past Medical History  Diagnosis Date  . CIN I (cervical intraepithelial neoplasia I) 2005  . Osteopenia 12/2013    T score -2.0 FRAX 6.7%/0.5%  . Migraines   . Right shoulder pain 07/08/2012  . Colon polyps 07/08/2012  . Hyperlipidemia 08/29/2012  . Skin cancer 07/08/2012    Basal cell  . Medicare annual wellness visit, initial 04/06/2015    Past Surgical History  Procedure Laterality Date  . Cholecystectomy    . Tubal ligation    . Oophorectomy  2002    BSO  . Shoulder surgery      right rotator cuff repair  . Colposcopy    . Gynecologic cryosurgery    . Cesarean section  1978 and 1981  . Tonsillectomy and adenoidectomy      age 26  . Vaginal hysterectomy  2002    LAVH BSO  . Abdominal surgery      Tummy tuck    Family History  Problem Relation Age of Onset  . Hypertension Mother   . Breast cancer Mother 21  . Cancer Mother 35    breast  . Breast cancer Maternal Aunt 80  . Breast cancer Maternal Aunt     50's  . Migraines Father   . Migraines Sister   . Diabetes Maternal Grandmother     Social History   Social History  . Marital Status: Married    Spouse Name: N/A  . Number of Children: N/A  . Years of Education: N/A   Occupational History  . Not on file.   Social History Main Topics  . Smoking status: Never Smoker   . Smokeless tobacco: Never Used  . Alcohol Use: 0.0 oz/week    0 Standard drinks or equivalent per week     Comment: occasionally  . Drug Use: No  . Sexual Activity:    Partners: Male   Birth Control/ Protection: Surgical     Comment: HYST-1st intercourse 65 yo-Fewer than 5 partners   Other Topics Concern  . Not on file   Social History Narrative    Outpatient Prescriptions Prior to Visit  Medication Sig Dispense Refill  . Cholecalciferol (VITAMIN D PO) Take 2,000 Units by mouth.    . Cyanocobalamin (VITAMIN B-12 PO) Take by mouth daily. Take 2500 daily    . estradiol (ESTRACE) 1 MG tablet Take 1 tablet (1 mg total) by mouth daily. 30 tablet 12  . fish oil-omega-3 fatty acids 1000 MG capsule Take 1 g by mouth daily.    . NONFORMULARY OR COMPOUNDED ITEM Estradiol vaginal 0.02% cream insert twice weekly vaginally 90 each 2  . atorvastatin (LIPITOR) 40 MG tablet Take 1 tablet (40 mg total) by mouth daily. 30 tablet 11  . SUMAtriptan (IMITREX) 100 MG tablet Take 1 tablet (100 mg total) by mouth as directed. 9 tablet 11   No facility-administered medications prior to visit.    No Known Allergies  Review of Systems  Constitutional: Negative for fever and malaise/fatigue.  HENT: Negative  for congestion.   Eyes: Negative for discharge.  Respiratory: Negative for shortness of breath.   Cardiovascular: Negative for chest pain, palpitations and leg swelling.  Gastrointestinal: Negative for nausea and abdominal pain.  Genitourinary: Negative for dysuria.  Musculoskeletal: Negative for falls.  Skin: Negative for rash.  Neurological: Negative for loss of consciousness and headaches.  Endo/Heme/Allergies: Negative for environmental allergies.  Psychiatric/Behavioral: Negative for depression. The patient is not nervous/anxious.        Objective:    Physical Exam  Constitutional: She is oriented to person, place, and time. She appears well-developed and well-nourished. No distress.  HENT:  Head: Normocephalic and atraumatic.  Nose: Nose normal.  Eyes: Right eye exhibits no discharge. Left eye exhibits no discharge.  Neck: Normal range of motion. Neck supple.    Cardiovascular: Normal rate and regular rhythm.   No murmur heard. Pulmonary/Chest: Effort normal and breath sounds normal.  Abdominal: Soft. Bowel sounds are normal. There is no tenderness.  Musculoskeletal: She exhibits no edema.  Neurological: She is alert and oriented to person, place, and time.  Skin: Skin is warm and dry.  Psychiatric: She has a normal mood and affect.  Nursing note and vitals reviewed.   BP 112/69 mmHg  Pulse 68  Temp(Src) 98.1 F (36.7 C) (Oral)  Ht 5\' 4"  (1.626 m)  Wt 123 lb (55.792 kg)  BMI 21.10 kg/m2  SpO2 100% Wt Readings from Last 3 Encounters:  12/01/15 123 lb (55.792 kg)  04/03/15 127 lb (57.607 kg)  12/29/14 125 lb (56.7 kg)     Lab Results  Component Value Date   WBC 8.6 12/29/2014   HGB 13.5 12/29/2014   HCT 38.9 12/29/2014   PLT 264.0 12/29/2014   GLUCOSE 101* 12/29/2014   CHOL 162 12/29/2014   TRIG 151.0* 12/29/2014   HDL 58.10 12/29/2014   LDLCALC 74 12/29/2014   ALT 18 12/29/2014   AST 17 12/29/2014   NA 138 12/29/2014   K 3.8 12/29/2014   CL 104 12/29/2014   CREATININE 0.77 12/29/2014   BUN 22 12/29/2014   CO2 30 12/29/2014   TSH 1.39 12/29/2014    Lab Results  Component Value Date   TSH 1.39 12/29/2014   Lab Results  Component Value Date   WBC 8.6 12/29/2014   HGB 13.5 12/29/2014   HCT 38.9 12/29/2014   MCV 95.7 12/29/2014   PLT 264.0 12/29/2014   Lab Results  Component Value Date   NA 138 12/29/2014   K 3.8 12/29/2014   CO2 30 12/29/2014   GLUCOSE 101* 12/29/2014   BUN 22 12/29/2014   CREATININE 0.77 12/29/2014   BILITOT 0.4 12/29/2014   ALKPHOS 79 12/29/2014   AST 17 12/29/2014   ALT 18 12/29/2014   PROT 6.9 12/29/2014   ALBUMIN 4.4 12/29/2014   CALCIUM 10.0 12/29/2014   GFR 79.78 12/29/2014   Lab Results  Component Value Date   CHOL 162 12/29/2014   Lab Results  Component Value Date   HDL 58.10 12/29/2014   Lab Results  Component Value Date   LDLCALC 74 12/29/2014   Lab Results   Component Value Date   TRIG 151.0* 12/29/2014   Lab Results  Component Value Date   CHOLHDL 3 12/29/2014   No results found for: HGBA1C     Assessment & Plan:   Problem List Items Addressed This Visit    Hyperlipidemia    Tolerating statin, encouraged heart healthy diet, avoid trans fats, minimize simple carbs and saturated fats.  Increase exercise as tolerated      Relevant Medications   atorvastatin (LIPITOR) 40 MG tablet   Other Relevant Orders   Lipid panel   Comprehensive metabolic panel   Migraines - Primary   Relevant Medications   SUMAtriptan (IMITREX) 100 MG tablet   atorvastatin (LIPITOR) 40 MG tablet   Osteopenia    Bone density shows osteopenia. Recommend calcium intake of 1200 to 1500 mg daily, divided into roughly 3 doses. Best source is the diet and a single dairy serving is about 500 mg, a supplement of calcium citrate once or twice daily to balance diet is fine if not getting enough in diet. Also need Vitamin D 2000 IU caps, 1 cap daily if not already taking vitamin D. Also recommend weight baring exercise on hips and upper body to keep bones strong       Other Visit Diagnoses    Gastroesophageal reflux disease with esophagitis        Relevant Orders    Comprehensive metabolic panel       I am having Ms. Diskin maintain her fish oil-omega-3 fatty acids, Cholecalciferol (VITAMIN D PO), estradiol, NONFORMULARY OR COMPOUNDED ITEM, Cyanocobalamin (VITAMIN B-12 PO), SUMAtriptan, and atorvastatin.  Meds ordered this encounter  Medications  . SUMAtriptan (IMITREX) 100 MG tablet    Sig: Take 1 tablet (100 mg total) by mouth as directed.    Dispense:  9 tablet    Refill:  11  . atorvastatin (LIPITOR) 40 MG tablet    Sig: Take 1 tablet (40 mg total) by mouth daily.    Dispense:  30 tablet    Refill:  11     Penni Homans, MD

## 2015-12-22 ENCOUNTER — Other Ambulatory Visit: Payer: Self-pay | Admitting: Gynecology

## 2015-12-23 NOTE — Telephone Encounter (Signed)
Annual scheduled on 01/06/16

## 2015-12-28 ENCOUNTER — Other Ambulatory Visit: Payer: PPO

## 2016-01-04 ENCOUNTER — Ambulatory Visit: Payer: PPO | Admitting: Family Medicine

## 2016-01-06 ENCOUNTER — Encounter: Payer: Self-pay | Admitting: Gynecology

## 2016-01-06 ENCOUNTER — Ambulatory Visit (INDEPENDENT_AMBULATORY_CARE_PROVIDER_SITE_OTHER): Payer: PPO | Admitting: Gynecology

## 2016-01-06 VITALS — BP 120/76 | Ht 63.0 in | Wt 123.0 lb

## 2016-01-06 DIAGNOSIS — Z79890 Hormone replacement therapy (postmenopausal): Secondary | ICD-10-CM | POA: Diagnosis not present

## 2016-01-06 DIAGNOSIS — M8580 Other specified disorders of bone density and structure, unspecified site: Secondary | ICD-10-CM | POA: Diagnosis not present

## 2016-01-06 DIAGNOSIS — N952 Postmenopausal atrophic vaginitis: Secondary | ICD-10-CM | POA: Diagnosis not present

## 2016-01-06 DIAGNOSIS — Z01419 Encounter for gynecological examination (general) (routine) without abnormal findings: Secondary | ICD-10-CM | POA: Diagnosis not present

## 2016-01-06 MED ORDER — ESTRADIOL 1 MG PO TABS: 1.0000 mg | ORAL_TABLET | Freq: Every day | ORAL | Status: DC

## 2016-01-06 NOTE — Patient Instructions (Signed)

## 2016-01-06 NOTE — Progress Notes (Signed)
    DELAYNEE EDQUIST [id number]        65 y.o.  G2P2002  for breast and pelvic exam. Several issues noted below  Past medical history,surgical history, problem list, medications, allergies, family history and social history were all reviewed and documented as reviewed in the EPIC chart.  ROS:  Performed with pertinent positives and negatives included in the history, assessment and plan.   Additional significant findings :  none   Exam: [name] assistant Filed Vitals:   01/06/16 1401  BP: 120/76  Height: 5\' 3"  (1.6 m)  Weight: 123 lb (55.792 kg)   General appearance:  Normal affect, orientation and appearance. Skin: Grossly normal HEENT: Without gross lesions.  No cervical or supraclavicular adenopathy. Thyroid normal.  Lungs:  Clear without wheezing, rales or rhonchi Cardiac: RR, without RMG Abdominal:  Soft, nontender, without masses, guarding, rebound, organomegaly or hernia Breasts:  Examined lying and sitting without masses, retractions, discharge or axillary adenopathy. Pelvic:  Ext/BUS/vagina with atrophic changes  Adnexa without masses or tenderness    Anus and perineum normal   Rectovaginal normal sphincter tone without palpated masses or tenderness.    Assessment/Plan:  65 y.o. [id number] female for breast and pelvic exam.   1. [name] for bleeding, pelvic pain, leiomyoma, simple hyperplasia and adenomyosis. Continues on Estrace 1 mg daily and formulated estradiol cream twice weekly for atrophic vaginitis. Is doing well and wants to continue. I reviewed the risks to include the WHI study with increased risk of stroke heart attack DVT. Possible breast cancer risks also reviewed. Statement for lowest dose for shortest period of time discussed. I asked the patient to try one half of her 1 mg pill daily and see how she does with this. If tolerable then continue on the 0.5 mg estradiol dose. If unable to tolerate this then continue on the 1 mg.  Refill for both the cream and  Estrace 1 mg tablets provided for 1 year. 2. Osteopenia. DEXA 12/2013 T score -2.0. FRAX 6.7%/0.5%. Plan repeat DEXA now at 2 year interval and patient will schedule. 3. Pap smear 11/2014. No Pap smear done today. History of LGSIL in the past. Options to stop screening based on age and hysterectomy history reviewed. Will readdress on an annual basis. 4. Mammography 04/2015. Continue with annual mammography when due. SBE monthly reviewed. 5. Colonoscopy 2016. Repeat at their recommended interval. 6. Health maintenance. No routine lab work done as patient reports this done elsewhere. Follow up for DEXA otherwise 1 year, sooner as needed.   Anastasio Auerbach MD, 2:23 PM 01/06/2016

## 2016-01-07 ENCOUNTER — Telehealth: Payer: Self-pay | Admitting: *Deleted

## 2016-01-07 MED ORDER — NONFORMULARY OR COMPOUNDED ITEM: Status: DC

## 2016-01-07 NOTE — Telephone Encounter (Signed)
-----   Message from Anastasio Auerbach, MD sent at 01/06/2016  2:31 PM EDT ----- [facility] estradiol cream twice weekly prefilled syringes

## 2016-01-16 DIAGNOSIS — M8580 Other specified disorders of bone density and structure, unspecified site: Secondary | ICD-10-CM

## 2016-01-16 HISTORY — DX: Other specified disorders of bone density and structure, unspecified site: M85.80

## 2016-01-26 ENCOUNTER — Encounter: Payer: Self-pay | Admitting: Gynecology

## 2016-01-26 ENCOUNTER — Ambulatory Visit (INDEPENDENT_AMBULATORY_CARE_PROVIDER_SITE_OTHER): Payer: PPO

## 2016-01-26 ENCOUNTER — Other Ambulatory Visit: Payer: Self-pay | Admitting: Gynecology

## 2016-01-26 DIAGNOSIS — M899 Disorder of bone, unspecified: Secondary | ICD-10-CM | POA: Diagnosis not present

## 2016-01-26 DIAGNOSIS — M8580 Other specified disorders of bone density and structure, unspecified site: Secondary | ICD-10-CM

## 2016-05-05 ENCOUNTER — Other Ambulatory Visit: Payer: Self-pay | Admitting: Gynecology

## 2016-05-05 DIAGNOSIS — Z1231 Encounter for screening mammogram for malignant neoplasm of breast: Secondary | ICD-10-CM

## 2016-05-12 ENCOUNTER — Ambulatory Visit: Payer: PPO

## 2016-05-18 ENCOUNTER — Ambulatory Visit
Admission: RE | Admit: 2016-05-18 | Discharge: 2016-05-18 | Disposition: A | Discharge status: Home / Self Care | Payer: PPO | Source: Ambulatory Visit | Attending: Gynecology | Admitting: Gynecology

## 2016-05-18 DIAGNOSIS — Z1231 Encounter for screening mammogram for malignant neoplasm of breast: Secondary | ICD-10-CM

## 2016-06-06 ENCOUNTER — Ambulatory Visit (INDEPENDENT_AMBULATORY_CARE_PROVIDER_SITE_OTHER): Payer: PPO | Admitting: Family Medicine

## 2016-06-06 ENCOUNTER — Encounter: Payer: Self-pay | Admitting: Family Medicine

## 2016-06-06 VITALS — BP 104/64 | HR 64 | Temp 98.1°F | Ht 63.0 in | Wt 123.0 lb

## 2016-06-06 DIAGNOSIS — Z0000 Encounter for general adult medical examination without abnormal findings: Secondary | ICD-10-CM

## 2016-06-06 DIAGNOSIS — M8580 Other specified disorders of bone density and structure, unspecified site: Secondary | ICD-10-CM

## 2016-06-06 DIAGNOSIS — G43809 Other migraine, not intractable, without status migrainosus: Secondary | ICD-10-CM | POA: Diagnosis not present

## 2016-06-06 DIAGNOSIS — E785 Hyperlipidemia, unspecified: Secondary | ICD-10-CM

## 2016-06-06 LAB — COMPREHENSIVE METABOLIC PANEL
ALK PHOS: 84 U/L (ref 39–117)
ALT: 24 U/L (ref 0–35)
AST: 22 U/L (ref 0–37)
Albumin: 4.5 g/dL (ref 3.5–5.2)
BILIRUBIN TOTAL: 0.8 mg/dL (ref 0.2–1.2)
BUN: 16 mg/dL (ref 6–23)
CALCIUM: 9.6 mg/dL (ref 8.4–10.5)
CO2: 28 mEq/L (ref 19–32)
Chloride: 101 mEq/L (ref 96–112)
Creatinine, Ser: 0.79 mg/dL (ref 0.40–1.20)
GFR: 77.12 mL/min (ref >60.00)
GLUCOSE: 110 mg/dL — AB (ref 70–99)
POTASSIUM: 4.1 meq/L (ref 3.5–5.1)
Sodium: 138 mEq/L (ref 135–145)
TOTAL PROTEIN: 7.2 g/dL (ref 6.0–8.3)

## 2016-06-06 LAB — TSH: TSH: 1.97 u[IU]/mL (ref 0.35–4.50)

## 2016-06-06 LAB — LIPID PANEL
CHOLESTEROL: 185 mg/dL (ref 0–200)
HDL: 60.6 mg/dL (ref >39.00)
LDL Cholesterol: 99 mg/dL (ref 0–99)
NonHDL: 124.8
TRIGLYCERIDES: 129 mg/dL (ref 0.0–149.0)
Total CHOL/HDL Ratio: 3
VLDL: 25.8 mg/dL (ref 0.0–40.0)

## 2016-06-06 LAB — CBC
HEMATOCRIT: 40.7 % (ref 36.0–46.0)
HEMOGLOBIN: 14 g/dL (ref 12.0–15.0)
MCHC: 34.4 g/dL (ref 30.0–36.0)
MCV: 95.5 fl (ref 78.0–100.0)
Platelets: 263 10*3/uL (ref 150.0–400.0)
RBC: 4.26 Mil/uL (ref 3.87–5.11)
RDW: 12.9 % (ref 11.5–15.5)
WBC: 8.4 10*3/uL (ref 4.0–10.5)

## 2016-06-06 LAB — VITAMIN D 25 HYDROXY (VIT D DEFICIENCY, FRACTURES): VITD: 40.01 ng/mL (ref 30.00–100.00)

## 2016-06-06 NOTE — Progress Notes (Signed)
Patient ID: Donna Guerra, female   DOB: Dec 02, 1950, 65 y.o.   MRN: [id number]   Subjective:    Patient ID: Donna Guerra, female    DOB: Mar 03, 1951, 65 y.o.   MRN: [id number]  Chief Complaint  Patient presents with  . Annual Exam    HPI Patient is in today for annual physical exam and to follow up on medical concerns such as high cholesterol, osteopenia and migraines. She feels well today. No recent illness or recent hospitalization. Follows with GYN for paps, mgm's etc. Is taking vitamin D 2000 IU daily. Is doing well at home. No difficulty with ADLs at home. Is eating well. Denies CP/palp/SOB/HA/congestion/fevers/GI or GU c/o. Taking meds as prescribed  Past Medical History:  Diagnosis Date  . CIN I (cervical intraepithelial neoplasia I) 2005  . Colon polyps 07/08/2012  . Hyperlipidemia 08/29/2012  . Migraines   . Osteopenia 01/2016   T score -1.7 FRAX 8.5%/1.1%  . Right shoulder pain 07/08/2012  . Skin cancer 07/08/2012   Basal cell    Past Surgical History:  Procedure Laterality Date  . ABDOMINAL SURGERY     Tummy tuck  . [place]  . CHOLECYSTECTOMY    . COLPOSCOPY    . GYNECOLOGIC CRYOSURGERY    . OOPHORECTOMY  2002   BSO  . SHOULDER SURGERY     right rotator cuff repair  . TONSILLECTOMY AND ADENOIDECTOMY     age 1  . TUBAL LIGATION    . VAGINAL HYSTERECTOMY  2002   LAVH BSO    Family History  Problem Relation Age of Onset  . Hypertension Mother   . Breast cancer Mother 75  . Breast cancer Maternal Aunt 80  . Breast cancer Maternal Aunt     50's  . Migraines Father   . Migraines Sister   . Cancer Sister     Melanoma  . Diabetes Maternal Grandmother     Social History   Social History  . Marital status: Married    Spouse name: N/A  . Number of children: N/A  . Years of education: N/A   Occupational History  . Not on file.   Social History Main Topics  . Smoking status: Never Smoker  . Smokeless tobacco: Never Used  .  Alcohol use 0.0 oz/week     Comment: occasionally  . Drug use: No  . Sexual activity: Yes    Partners: Male    Birth control/ protection: Surgical     Comment: HYST-1st intercourse 65 yo-Fewer than 5 partners   Other Topics Concern  . Not on file   Social History Narrative  . No narrative on file    Outpatient Medications Prior to Visit  Medication Sig Dispense Refill  . atorvastatin (LIPITOR) 40 MG tablet Take 1 tablet (40 mg total) by mouth daily. 30 tablet 11  . Cholecalciferol (VITAMIN D PO) Take 2,000 Units by mouth.    . Cyanocobalamin (VITAMIN B-12 PO) Take by mouth daily. Take 2500 daily    . estradiol (ESTRACE) 1 MG tablet Take 1 tablet (1 mg total) by mouth daily. 30 tablet 12  . fish oil-omega-3 fatty acids 1000 MG capsule Take 1 g by mouth daily.    . NONFORMULARY OR COMPOUNDED ITEM Estradiol vaginal 0.02% cream insert twice weekly vaginally 90 each 3  . SUMAtriptan (IMITREX) 100 MG tablet Take 1 tablet (100 mg total) by mouth as directed. 9 tablet 11   No facility-administered medications prior  to visit.     No Known Allergies  Review of Systems  Constitutional: Negative for chills, fever and malaise/fatigue.  HENT: Negative for congestion and hearing loss.   Eyes: Negative for discharge.  Respiratory: Negative for cough, sputum production and shortness of breath.   Cardiovascular: Negative for chest pain, palpitations and leg swelling.  Gastrointestinal: Negative for abdominal pain, blood in stool, constipation, diarrhea, heartburn, nausea and vomiting.  Genitourinary: Negative for dysuria, frequency, hematuria and urgency.  Musculoskeletal: Negative for back pain, falls and myalgias.  Skin: Negative for rash.  Neurological: Negative for dizziness, sensory change, loss of consciousness, weakness and headaches.  Endo/Heme/Allergies: Negative for environmental allergies. Does not bruise/bleed easily.  Psychiatric/Behavioral: Negative for depression and suicidal  ideas. The patient is not nervous/anxious and does not have insomnia.        Objective:    Physical Exam  Constitutional: She is oriented to person, place, and time. She appears well-developed and well-nourished. No distress.  HENT:  Head: Normocephalic and atraumatic.  Eyes: Conjunctivae are normal.  Neck: Neck supple. No thyromegaly present.  Cardiovascular: Normal rate, regular rhythm and normal heart sounds.   No murmur heard. Pulmonary/Chest: Effort normal and breath sounds normal. No respiratory distress.  Abdominal: Soft. Bowel sounds are normal. She exhibits no distension and no mass. There is no tenderness.  Musculoskeletal: She exhibits no edema.  Lymphadenopathy:    She has no cervical adenopathy.  Neurological: She is alert and oriented to person, place, and time.  Skin: Skin is warm and dry.  Psychiatric: She has a normal mood and affect. Her behavior is normal.    BP 104/64 (BP Location: Left Arm, Patient Position: Sitting, Cuff Size: Normal)   Pulse 64   Temp 98.1 F (36.7 C) (Oral)   Ht 5\' 3"  (1.6 m)   Wt 123 lb (55.8 kg)   SpO2 97%   BMI 21.79 kg/m  Wt Readings from Last 3 Encounters:  06/06/16 123 lb (55.8 kg)  01/06/16 123 lb (55.8 kg)  12/01/15 123 lb (55.8 kg)     Lab Results  Component Value Date   WBC 8.6 12/29/2014   HGB 13.5 12/29/2014   HCT 38.9 12/29/2014   PLT 264.0 12/29/2014   GLUCOSE 101 (H) 12/29/2014   CHOL 162 12/29/2014   TRIG 151.0 (H) 12/29/2014   HDL 58.10 12/29/2014   LDLCALC 74 12/29/2014   ALT 18 12/29/2014   AST 17 12/29/2014   NA 138 12/29/2014   K 3.8 12/29/2014   CL 104 12/29/2014   CREATININE 0.77 12/29/2014   BUN 22 12/29/2014   CO2 30 12/29/2014   TSH 1.39 12/29/2014    Lab Results  Component Value Date   TSH 1.39 12/29/2014   Lab Results  Component Value Date   WBC 8.6 12/29/2014   HGB 13.5 12/29/2014   HCT 38.9 12/29/2014   MCV 95.7 12/29/2014   PLT 264.0 12/29/2014   Lab Results  Component  Value Date   NA 138 12/29/2014   K 3.8 12/29/2014   CO2 30 12/29/2014   GLUCOSE 101 (H) 12/29/2014   BUN 22 12/29/2014   CREATININE 0.77 12/29/2014   BILITOT 0.4 12/29/2014   ALKPHOS 79 12/29/2014   AST 17 12/29/2014   ALT 18 12/29/2014   PROT 6.9 12/29/2014   ALBUMIN 4.4 12/29/2014   CALCIUM 10.0 12/29/2014   GFR 79.78 12/29/2014   Lab Results  Component Value Date   CHOL 162 12/29/2014   Lab Results  Component  Value Date   HDL 58.10 12/29/2014   Lab Results  Component Value Date   LDLCALC 74 12/29/2014   Lab Results  Component Value Date   TRIG 151.0 (H) 12/29/2014   Lab Results  Component Value Date   CHOLHDL 3 12/29/2014   No results found for: HGBA1C     Assessment & Plan:   Problem List Items Addressed This Visit    Osteopenia    Encouraged to get adequate exercise, calcium and vitamin d intake      Relevant Orders   VITAMIN D 25 Hydroxy (Vit-D Deficiency, Fractures)   Migraines   Relevant Orders   TSH   CBC   Preventative health care    Patient encouraged to maintain heart healthy diet, regular exercise, adequate sleep. Consider daily probiotics. Take medications as prescribed. Given and reviewed copy of ACP documents from [employer] and encouraged to complete and return. Is having a face lift with Dr Manning Charity in [facility]      Relevant Orders   TSH   CBC   Comprehensive metabolic panel   Hyperlipidemia - Primary    Encouraged heart healthy diet, increase exercise, avoid trans fats, consider a krill oil cap daily      Relevant Orders   Lipid panel    Other Visit Diagnoses   None.     I am having Ms. Valin maintain her fish oil-omega-3 fatty acids, Cholecalciferol (VITAMIN D PO), Cyanocobalamin (VITAMIN B-12 PO), SUMAtriptan, atorvastatin, estradiol, and NONFORMULARY OR COMPOUNDED ITEM.  No orders of the defined types were placed in this encounter.    Penni Homans, MD

## 2016-06-06 NOTE — Assessment & Plan Note (Signed)
Encouraged heart healthy diet, increase exercise, avoid trans fats, consider a krill oil cap daily 

## 2016-06-06 NOTE — Assessment & Plan Note (Signed)
Encouraged to get adequate exercise, calcium and vitamin d intake 

## 2016-06-06 NOTE — Assessment & Plan Note (Addendum)
Patient encouraged to maintain heart healthy diet, regular exercise, adequate sleep. Consider daily probiotics. Take medications as prescribed. Given and reviewed copy of ACP documents from [employer] and encouraged to complete and return. Is having a face lift with Dr Manning Charity in [facility]

## 2016-06-06 NOTE — Patient Instructions (Signed)
Preventive Care for Adults, Female A healthy lifestyle and preventive care can promote health and wellness. Preventive health guidelines for women include the following key practices.  A routine yearly physical is a good way to check with your health care provider about your health and preventive screening. It is a chance to share any concerns and updates on your health and to receive a thorough exam.  Visit your dentist for a routine exam and preventive care every 6 months. Brush your teeth twice a day and floss once a day. Good oral hygiene prevents tooth decay and gum disease.  The frequency of eye exams is based on your age, health, family medical history, use of contact lenses, and other factors. Follow your health care provider's recommendations for frequency of eye exams.  Eat a healthy diet. Foods like vegetables, fruits, whole grains, low-fat dairy products, and lean protein foods contain the nutrients you need without too many calories. Decrease your intake of foods high in solid fats, added sugars, and salt. Eat the right amount of calories for you.Get information about a proper diet from your health care provider, if necessary.  Regular physical exercise is one of the most important things you can do for your health. Most adults should get at least 150 minutes of moderate-intensity exercise (any activity that increases your heart rate and causes you to sweat) each week. In addition, most adults need muscle-strengthening exercises on 2 or more days a week.  Maintain a healthy weight. The body mass index (BMI) is a screening tool to identify possible weight problems. It provides an estimate of body fat based on height and weight. Your health care provider can find your BMI and can help you achieve or maintain a healthy weight.For adults 20 years and older:  A BMI below 18.5 is considered underweight.  A BMI of 18.5 to 24.9 is normal.  A BMI of 25 to 29.9 is considered overweight.  A  BMI of 30 and above is considered obese.  Maintain normal blood lipids and cholesterol levels by exercising and minimizing your intake of saturated fat. Eat a balanced diet with plenty of fruit and vegetables. Blood tests for lipids and cholesterol should begin at age 45 and be repeated every 5 years. If your lipid or cholesterol levels are high, you are over 50, or you are at high risk for heart disease, you may need your cholesterol levels checked more frequently.Ongoing high lipid and cholesterol levels should be treated with medicines if diet and exercise are not working.  If you smoke, find out from your health care provider how to quit. If you do not use tobacco, do not start.  Lung cancer screening is recommended for adults aged 45-80 years who are at high risk for developing lung cancer because of a history of smoking. A yearly low-dose CT scan of the lungs is recommended for people who have at least a 30-pack-year history of smoking and are a current smoker or have quit within the past 15 years. A pack year of smoking is smoking an average of 1 pack of cigarettes a day for 1 year (for example: 1 pack a day for 30 years or 2 packs a day for 15 years). Yearly screening should continue until the smoker has stopped smoking for at least 15 years. Yearly screening should be stopped for people who develop a health problem that would prevent them from having lung cancer treatment.  If you are pregnant, do not drink alcohol. If you are  breastfeeding, be very cautious about drinking alcohol. If you are not pregnant and choose to drink alcohol, do not have more than 1 drink per day. One drink is considered to be 12 ounces (355 mL) of beer, 5 ounces (148 mL) of wine, or 1.5 ounces (44 mL) of liquor.  Avoid use of street drugs. Do not share needles with anyone. Ask for help if you need support or instructions about stopping the use of drugs.  High blood pressure causes heart disease and increases the risk  of stroke. Your blood pressure should be checked at least every 1 to 2 years. Ongoing high blood pressure should be treated with medicines if weight loss and exercise do not work.  If you are 55-79 years old, ask your health care provider if you should take aspirin to prevent strokes.  Diabetes screening is done by taking a blood sample to check your blood glucose level after you have not eaten for a certain period of time (fasting). If you are not overweight and you do not have risk factors for diabetes, you should be screened once every 3 years starting at age 45. If you are overweight or obese and you are 40-70 years of age, you should be screened for diabetes every year as part of your cardiovascular risk assessment.  Breast cancer screening is essential preventive care for women. You should practice "breast self-awareness." This means understanding the normal appearance and feel of your breasts and may include breast self-examination. Any changes detected, no matter how small, should be reported to a health care provider. Women in their 20s and 30s should have a clinical breast exam (CBE) by a health care provider as part of a regular health exam every 1 to 3 years. After age 40, women should have a CBE every year. Starting at age 40, women should consider having a mammogram (breast X-ray test) every year. Women who have a family history of breast cancer should talk to their health care provider about genetic screening. Women at a high risk of breast cancer should talk to their health care providers about having an MRI and a mammogram every year.  Breast cancer gene (BRCA)-related cancer risk assessment is recommended for women who have family members with BRCA-related cancers. BRCA-related cancers include breast, ovarian, tubal, and peritoneal cancers. Having family members with these cancers may be associated with an increased risk for harmful changes (mutations) in the breast cancer genes BRCA1 and  BRCA2. Results of the assessment will determine the need for genetic counseling and BRCA1 and BRCA2 testing.  Your health care provider may recommend that you be screened regularly for cancer of the pelvic organs (ovaries, uterus, and vagina). This screening involves a pelvic examination, including checking for microscopic changes to the surface of your cervix (Pap test). You may be encouraged to have this screening done every 3 years, beginning at age 21.  For women ages 30-65, health care providers may recommend pelvic exams and Pap testing every 3 years, or they may recommend the Pap and pelvic exam, combined with testing for human papilloma virus (HPV), every 5 years. Some types of HPV increase your risk of cervical cancer. Testing for HPV may also be done on women of any age with unclear Pap test results.  Other health care providers may not recommend any screening for nonpregnant women who are considered low risk for pelvic cancer and who do not have symptoms. Ask your health care provider if a screening pelvic exam is right for   you.  If you have had past treatment for cervical cancer or a condition that could lead to cancer, you need Pap tests and screening for cancer for at least 20 years after your treatment. If Pap tests have been discontinued, your risk factors (such as having a new sexual partner) need to be reassessed to determine if screening should resume. Some women have medical problems that increase the chance of getting cervical cancer. In these cases, your health care provider may recommend more frequent screening and Pap tests.  Colorectal cancer can be detected and often prevented. Most routine colorectal cancer screening begins at the age of 50 years and continues through age 75 years. However, your health care provider may recommend screening at an earlier age if you have risk factors for colon cancer. On a yearly basis, your health care provider may provide home test kits to check  for hidden blood in the stool. Use of a small camera at the end of a tube, to directly examine the colon (sigmoidoscopy or colonoscopy), can detect the earliest forms of colorectal cancer. Talk to your health care provider about this at age 50, when routine screening begins. Direct exam of the colon should be repeated every 5-10 years through age 75 years, unless early forms of precancerous polyps or small growths are found.  People who are at an increased risk for hepatitis B should be screened for this virus. You are considered at high risk for hepatitis B if:  You were born in a country where hepatitis B occurs often. Talk with your health care provider about which countries are considered high risk.  Your parents were born in a high-risk country and you have not received a shot to protect against hepatitis B (hepatitis B vaccine).  You have HIV or AIDS.  You use needles to inject street drugs.  You live with, or have sex with, someone who has hepatitis B.  You get hemodialysis treatment.  You take certain medicines for conditions like cancer, organ transplantation, and autoimmune conditions.  Hepatitis C blood testing is recommended for all people born from 1945 through 1965 and any individual with known risks for hepatitis C.  Practice safe sex. Use condoms and avoid high-risk sexual practices to reduce the spread of sexually transmitted infections (STIs). STIs include gonorrhea, chlamydia, syphilis, trichomonas, herpes, HPV, and human immunodeficiency virus (HIV). Herpes, HIV, and HPV are viral illnesses that have no cure. They can result in disability, cancer, and death.  You should be screened for sexually transmitted illnesses (STIs) including gonorrhea and chlamydia if:  You are sexually active and are younger than 24 years.  You are older than 24 years and your health care provider tells you that you are at risk for this type of infection.  Your sexual activity has changed  since you were last screened and you are at an increased risk for chlamydia or gonorrhea. Ask your health care provider if you are at risk.  If you are at risk of being infected with HIV, it is recommended that you take a prescription medicine daily to prevent HIV infection. This is called preexposure prophylaxis (PrEP). You are considered at risk if:  You are sexually active and do not regularly use condoms or know the HIV status of your partner(s).  You take drugs by injection.  You are sexually active with a partner who has HIV.  Talk with your health care provider about whether you are at high risk of being infected with HIV. If   you choose to begin PrEP, you should first be tested for HIV. You should then be tested every 3 months for as long as you are taking PrEP.  Osteoporosis is a disease in which the bones lose minerals and strength with aging. This can result in serious bone fractures or breaks. The risk of osteoporosis can be identified using a bone density scan. Women ages 67 years and over and women at risk for fractures or osteoporosis should discuss screening with their health care providers. Ask your health care provider whether you should take a calcium supplement or vitamin D to reduce the rate of osteoporosis.  Menopause can be associated with physical symptoms and risks. Hormone replacement therapy is available to decrease symptoms and risks. You should talk to your health care provider about whether hormone replacement therapy is right for you.  Use sunscreen. Apply sunscreen liberally and repeatedly throughout the day. You should seek shade when your shadow is shorter than you. Protect yourself by wearing long sleeves, pants, a wide-brimmed hat, and sunglasses year round, whenever you are outdoors.  Once a month, do a whole body skin exam, using a mirror to look at the skin on your back. Tell your health care provider of new moles, moles that have irregular borders, moles that  are larger than a pencil eraser, or moles that have changed in shape or color.  Stay current with required vaccines (immunizations).  Influenza vaccine. All adults should be immunized every year.  Tetanus, diphtheria, and acellular pertussis (Td, Tdap) vaccine. Pregnant women should receive 1 dose of Tdap vaccine during each pregnancy. The dose should be obtained regardless of the length of time since the last dose. Immunization is preferred during the 27th-36th week of gestation. An adult who has not previously received Tdap or who does not know her vaccine status should receive 1 dose of Tdap. This initial dose should be followed by tetanus and diphtheria toxoids (Td) booster doses every 10 years. Adults with an unknown or incomplete history of completing a 3-dose immunization series with Td-containing vaccines should begin or complete a primary immunization series including a Tdap dose. Adults should receive a Td booster every 10 years.  Varicella vaccine. An adult without evidence of immunity to varicella should receive 2 doses or a second dose if she has previously received 1 dose. Pregnant females who do not have evidence of immunity should receive the first dose after pregnancy. This first dose should be obtained before leaving the health care facility. The second dose should be obtained 4-8 weeks after the first dose.  Human papillomavirus (HPV) vaccine. Females aged 13-26 years who have not received the vaccine previously should obtain the 3-dose series. The vaccine is not recommended for use in pregnant females. However, pregnancy testing is not needed before receiving a dose. If a female is found to be pregnant after receiving a dose, no treatment is needed. In that case, the remaining doses should be delayed until after the pregnancy. Immunization is recommended for any person with an immunocompromised condition through the age of 61 years if she did not get any or all doses earlier. During the  3-dose series, the second dose should be obtained 4-8 weeks after the first dose. The third dose should be obtained 24 weeks after the first dose and 16 weeks after the second dose.  Zoster vaccine. One dose is recommended for adults aged 30 years or older unless certain conditions are present.  Measles, mumps, and rubella (MMR) vaccine. Adults born  before 1957 generally are considered immune to measles and mumps. Adults born in 1957 or later should have 1 or more doses of MMR vaccine unless there is a contraindication to the vaccine or there is laboratory evidence of immunity to each of the three diseases. A routine second dose of MMR vaccine should be obtained at least 28 days after the first dose for students attending postsecondary schools, health care workers, or international travelers. People who received inactivated measles vaccine or an unknown type of measles vaccine during 1963-1967 should receive 2 doses of MMR vaccine. People who received inactivated mumps vaccine or an unknown type of mumps vaccine before 1979 and are at high risk for mumps infection should consider immunization with 2 doses of MMR vaccine. For females of childbearing age, rubella immunity should be determined. If there is no evidence of immunity, females who are not pregnant should be vaccinated. If there is no evidence of immunity, females who are pregnant should delay immunization until after pregnancy. Unvaccinated health care workers born before 1957 who lack laboratory evidence of measles, mumps, or rubella immunity or laboratory confirmation of disease should consider measles and mumps immunization with 2 doses of MMR vaccine or rubella immunization with 1 dose of MMR vaccine.  Pneumococcal 13-valent conjugate (PCV13) vaccine. When indicated, a person who is uncertain of his immunization history and has no record of immunization should receive the PCV13 vaccine. All adults 65 years of age and older should receive this  vaccine. An adult aged 19 years or older who has certain medical conditions and has not been previously immunized should receive 1 dose of PCV13 vaccine. This PCV13 should be followed with a dose of pneumococcal polysaccharide (PPSV23) vaccine. Adults who are at high risk for pneumococcal disease should obtain the PPSV23 vaccine at least 8 weeks after the dose of PCV13 vaccine. Adults older than 67 years of age who have normal immune system function should obtain the PPSV23 vaccine dose at least 1 year after the dose of PCV13 vaccine.  Pneumococcal polysaccharide (PPSV23) vaccine. When PCV13 is also indicated, PCV13 should be obtained first. All adults aged 65 years and older should be immunized. An adult younger than age 65 years who has certain medical conditions should be immunized. Any person who resides in a nursing home or long-term care facility should be immunized. An adult smoker should be immunized. People with an immunocompromised condition and certain other conditions should receive both PCV13 and PPSV23 vaccines. People with human immunodeficiency virus (HIV) infection should be immunized as soon as possible after diagnosis. Immunization during chemotherapy or radiation therapy should be avoided. Routine use of PPSV23 vaccine is not recommended for American Indians, [place] Natives, or people younger than 65 years unless there are medical conditions that require PPSV23 vaccine. When indicated, people who have unknown immunization and have no record of immunization should receive PPSV23 vaccine. One-time revaccination 5 years after the first dose of PPSV23 is recommended for people aged 19-64 years who have chronic kidney failure, nephrotic syndrome, asplenia, or immunocompromised conditions. People who received 1-2 doses of PPSV23 before age 65 years should receive another dose of PPSV23 vaccine at age 65 years or later if at least 5 years have passed since the previous dose. Doses of PPSV23 are not  needed for people immunized with PPSV23 at or after age 65 years.  Meningococcal vaccine. Adults with asplenia or persistent complement component deficiencies should receive 2 doses of quadrivalent meningococcal conjugate (MenACWY-D) vaccine. The doses should be obtained   at least 2 months apart. Microbiologists working with certain meningococcal bacteria, [place] recruits, people at risk during an outbreak, and people who travel to or live in countries with a high rate of meningitis should be immunized. A first-year college student up through age 34 years who is living in a residence hall should receive a dose if she did not receive a dose on or after her 16th birthday. Adults who have certain high-risk conditions should receive one or more doses of vaccine.  Hepatitis A vaccine. Adults who wish to be protected from this disease, have certain high-risk conditions, work with hepatitis A-infected animals, work in hepatitis A research labs, or travel to or work in countries with a high rate of hepatitis A should be immunized. Adults who were previously unvaccinated and who anticipate close contact with an international adoptee during the first 60 days after arrival in the [place] from a country with a high rate of hepatitis A should be immunized.  Hepatitis B vaccine. Adults who wish to be protected from this disease, have certain high-risk conditions, may be exposed to blood or other infectious body fluids, are household contacts or sex partners of hepatitis B positive people, are clients or workers in certain care facilities, or travel to or work in countries with a high rate of hepatitis B should be immunized.  Haemophilus influenzae type b (Hib) vaccine. A previously unvaccinated person with asplenia or sickle cell disease or having a scheduled splenectomy should receive 1 dose of Hib vaccine. Regardless of previous immunization, a recipient of a hematopoietic stem cell transplant should receive a  3-dose series 6-12 months after her successful transplant. Hib vaccine is not recommended for adults with HIV infection. Preventive Services / Frequency Ages 35 to 4 years  Blood pressure check.** / Every 3-5 years.  Lipid and cholesterol check.** / Every 5 years beginning at age 60.  Clinical breast exam.** / Every 3 years for women in their 71s and 10s.  BRCA-related cancer risk assessment.** / For women who have family members with a BRCA-related cancer (breast, ovarian, tubal, or peritoneal cancers).  Pap test.** / Every 2 years from ages 76 through 26. Every 3 years starting at age 61 through age 76 or 93 with a history of 3 consecutive normal Pap tests.  HPV screening.** / Every 3 years from ages 37 through ages 60 to 51 with a history of 3 consecutive normal Pap tests.  Hepatitis C blood test.** / For any individual with known risks for hepatitis C.  Skin self-exam. / Monthly.  Influenza vaccine. / Every year.  Tetanus, diphtheria, and acellular pertussis (Tdap, Td) vaccine.** / Consult your health care provider. Pregnant women should receive 1 dose of Tdap vaccine during each pregnancy. 1 dose of Td every 10 years.  Varicella vaccine.** / Consult your health care provider. Pregnant females who do not have evidence of immunity should receive the first dose after pregnancy.  HPV vaccine. / 3 doses over 6 months, if 93 and younger. The vaccine is not recommended for use in pregnant females. However, pregnancy testing is not needed before receiving a dose.  Measles, mumps, rubella (MMR) vaccine.** / You need at least 1 dose of MMR if you were born in 1957 or later. You may also need a 2nd dose. For females of childbearing age, rubella immunity should be determined. If there is no evidence of immunity, females who are not pregnant should be vaccinated. If there is no evidence of immunity, females who are  pregnant should delay immunization until after pregnancy.  Pneumococcal  13-valent conjugate (PCV13) vaccine.** / Consult your health care provider.  Pneumococcal polysaccharide (PPSV23) vaccine.** / 1 to 2 doses if you smoke cigarettes or if you have certain conditions.  Meningococcal vaccine.** / 1 dose if you are age 68 to 8 years and a Market researcher living in a residence hall, or have one of several medical conditions, you need to get vaccinated against meningococcal disease. You may also need additional booster doses.  Hepatitis A vaccine.** / Consult your health care provider.  Hepatitis B vaccine.** / Consult your health care provider.  Haemophilus influenzae type b (Hib) vaccine.** / Consult your health care provider. Ages 7 to 53 years  Blood pressure check.** / Every year.  Lipid and cholesterol check.** / Every 5 years beginning at age 25 years.  Lung cancer screening. / Every year if you are aged 11-80 years and have a 30-pack-year history of smoking and currently smoke or have quit within the past 15 years. Yearly screening is stopped once you have quit smoking for at least 15 years or develop a health problem that would prevent you from having lung cancer treatment.  Clinical breast exam.** / Every year after age 48 years.  BRCA-related cancer risk assessment.** / For women who have family members with a BRCA-related cancer (breast, ovarian, tubal, or peritoneal cancers).  Mammogram.** / Every year beginning at age 41 years and continuing for as long as you are in good health. Consult with your health care provider.  Pap test.** / Every 3 years starting at age 65 years through age 37 or 70 years with a history of 3 consecutive normal Pap tests.  HPV screening.** / Every 3 years from ages 72 years through ages 60 to 40 years with a history of 3 consecutive normal Pap tests.  Fecal occult blood test (FOBT) of stool. / Every year beginning at age 21 years and continuing until age 5 years. You may not need to do this test if you get  a colonoscopy every 10 years.  Flexible sigmoidoscopy or colonoscopy.** / Every 5 years for a flexible sigmoidoscopy or every 10 years for a colonoscopy beginning at age 35 years and continuing until age 48 years.  Hepatitis C blood test.** / For all people born from [birth year] through 1965 and any individual with known risks for hepatitis C.  Skin self-exam. / Monthly.  Influenza vaccine. / Every year.  Tetanus, diphtheria, and acellular pertussis (Tdap/Td) vaccine.** / Consult your health care provider. Pregnant women should receive 1 dose of Tdap vaccine during each pregnancy. 1 dose of Td every 10 years.  Varicella vaccine.** / Consult your health care provider. Pregnant females who do not have evidence of immunity should receive the first dose after pregnancy.  Zoster vaccine.** / 1 dose for adults aged 30 years or older.  Measles, mumps, rubella (MMR) vaccine.** / You need at least 1 dose of MMR if you were born in 1957 or later. You may also need a second dose. For females of childbearing age, rubella immunity should be determined. If there is no evidence of immunity, females who are not pregnant should be vaccinated. If there is no evidence of immunity, females who are pregnant should delay immunization until after pregnancy.  Pneumococcal 13-valent conjugate (PCV13) vaccine.** / Consult your health care provider.  Pneumococcal polysaccharide (PPSV23) vaccine.** / 1 to 2 doses if you smoke cigarettes or if you have certain conditions.  Meningococcal vaccine.** /  Consult your health care provider.  Hepatitis A vaccine.** / Consult your health care provider.  Hepatitis B vaccine.** / Consult your health care provider.  Haemophilus influenzae type b (Hib) vaccine.** / Consult your health care provider. Ages 64 years and over  Blood pressure check.** / Every year.  Lipid and cholesterol check.** / Every 5 years beginning at age 23 years.  Lung cancer screening. / Every year if you  are aged 16-80 years and have a 30-pack-year history of smoking and currently smoke or have quit within the past 15 years. Yearly screening is stopped once you have quit smoking for at least 15 years or develop a health problem that would prevent you from having lung cancer treatment.  Clinical breast exam.** / Every year after age 74 years.  BRCA-related cancer risk assessment.** / For women who have family members with a BRCA-related cancer (breast, ovarian, tubal, or peritoneal cancers).  Mammogram.** / Every year beginning at age 44 years and continuing for as long as you are in good health. Consult with your health care provider.  Pap test.** / Every 3 years starting at age 58 years through age 22 or 39 years with 3 consecutive normal Pap tests. Testing can be stopped between 65 and 70 years with 3 consecutive normal Pap tests and no abnormal Pap or HPV tests in the past 10 years.  HPV screening.** / Every 3 years from ages 64 years through ages 70 or 61 years with a history of 3 consecutive normal Pap tests. Testing can be stopped between 65 and 70 years with 3 consecutive normal Pap tests and no abnormal Pap or HPV tests in the past 10 years.  Fecal occult blood test (FOBT) of stool. / Every year beginning at age 40 years and continuing until age 27 years. You may not need to do this test if you get a colonoscopy every 10 years.  Flexible sigmoidoscopy or colonoscopy.** / Every 5 years for a flexible sigmoidoscopy or every 10 years for a colonoscopy beginning at age 7 years and continuing until age 32 years.  Hepatitis C blood test.** / For all people born from [birth year] through 1965 and any individual with known risks for hepatitis C.  Osteoporosis screening.** / A one-time screening for women ages 30 years and over and women at risk for fractures or osteoporosis.  Skin self-exam. / Monthly.  Influenza vaccine. / Every year.  Tetanus, diphtheria, and acellular pertussis (Tdap/Td)  vaccine.** / 1 dose of Td every 10 years.  Varicella vaccine.** / Consult your health care provider.  Zoster vaccine.** / 1 dose for adults aged 35 years or older.  Pneumococcal 13-valent conjugate (PCV13) vaccine.** / Consult your health care provider.  Pneumococcal polysaccharide (PPSV23) vaccine.** / 1 dose for all adults aged 46 years and older.  Meningococcal vaccine.** / Consult your health care provider.  Hepatitis A vaccine.** / Consult your health care provider.  Hepatitis B vaccine.** / Consult your health care provider.  Haemophilus influenzae type b (Hib) vaccine.** / Consult your health care provider. ** Family history and personal history of risk and conditions may change your health care provider's recommendations.   This information is not intended to replace advice given to you by your health care provider. Make sure you discuss any questions you have with your health care provider.   Document Released: 11/29/2001 Document Revised: 10/24/2014 Document Reviewed: 02/28/2011 Elsevier Interactive Patient Education [employer].

## 2016-06-06 NOTE — Progress Notes (Signed)
Pre visit review using our clinic review tool, if applicable. No additional management support is needed unless otherwise documented below in the visit note. 

## 2016-06-07 ENCOUNTER — Other Ambulatory Visit (INDEPENDENT_AMBULATORY_CARE_PROVIDER_SITE_OTHER): Payer: PPO

## 2016-06-07 DIAGNOSIS — R7309 Other abnormal glucose: Secondary | ICD-10-CM | POA: Diagnosis not present

## 2016-06-07 LAB — HEMOGLOBIN A1C: HEMOGLOBIN A1C: 5.5 % (ref 4.6–6.5)

## 2016-06-14 ENCOUNTER — Encounter: Payer: Self-pay | Admitting: Family Medicine

## 2016-07-01 ENCOUNTER — Encounter: Payer: Self-pay | Admitting: Family Medicine

## 2016-10-20 DIAGNOSIS — H2513 Age-related nuclear cataract, bilateral: Secondary | ICD-10-CM | POA: Diagnosis not present

## 2016-10-20 DIAGNOSIS — D3132 Benign neoplasm of left choroid: Secondary | ICD-10-CM | POA: Diagnosis not present

## 2016-10-20 DIAGNOSIS — H04123 Dry eye syndrome of bilateral lacrimal glands: Secondary | ICD-10-CM | POA: Diagnosis not present

## 2016-10-28 ENCOUNTER — Telehealth: Payer: Self-pay | Admitting: Family Medicine

## 2016-10-28 NOTE — Telephone Encounter (Signed)
Called patient to schedule awv. Left msg for patient to call office to schedule appt.  °

## 2016-11-08 NOTE — Telephone Encounter (Signed)
Spoke to patient, she is not interested in scheduled an annual wellness visit. Patient stated that she prefers for Dr. Charlett Blake to complete her annual wellness. Scheduled CPE (05/2017) for patient and f/u for medication refills (11/2016).

## 2016-12-06 ENCOUNTER — Ambulatory Visit (INDEPENDENT_AMBULATORY_CARE_PROVIDER_SITE_OTHER): Payer: PPO | Admitting: Family Medicine

## 2016-12-06 DIAGNOSIS — M8580 Other specified disorders of bone density and structure, unspecified site: Secondary | ICD-10-CM

## 2016-12-06 DIAGNOSIS — G43809 Other migraine, not intractable, without status migrainosus: Secondary | ICD-10-CM

## 2016-12-06 DIAGNOSIS — E785 Hyperlipidemia, unspecified: Secondary | ICD-10-CM | POA: Diagnosis not present

## 2016-12-06 MED ORDER — SUMATRIPTAN SUCCINATE 100 MG PO TABS: 100.0000 mg | ORAL_TABLET | ORAL | 11 refills | Status: DC

## 2016-12-06 MED ORDER — ATORVASTATIN CALCIUM 40 MG PO TABS: 40.0000 mg | ORAL_TABLET | Freq: Every day | ORAL | 6 refills | Status: DC

## 2016-12-06 NOTE — Progress Notes (Signed)
Pre visit review using our clinic review tool, if applicable. No additional management support is needed unless otherwise documented below in the visit note. 

## 2016-12-06 NOTE — Patient Instructions (Signed)
Migraine Headache A migraine headache is an intense, throbbing pain on one side or both sides of the head. Migraines may also cause other symptoms, such as nausea, vomiting, and sensitivity to light and noise. What are the causes? Doing or taking certain things may also trigger migraines, such as:  Alcohol.  Smoking.  Medicines, such as: ? Medicine used to treat chest pain (nitroglycerine). ? Birth control pills. ? Estrogen pills. ? Certain blood pressure medicines.  Aged cheeses, chocolate, or caffeine.  Foods or drinks that contain nitrates, glutamate, aspartame, or tyramine.  Physical activity.  Other things that may trigger a migraine include:  Menstruation.  Pregnancy.  Hunger.  Stress, lack of sleep, too much sleep, or fatigue.  Weather changes.  What increases the risk? The following factors may make you more likely to experience migraine headaches:  Age. Risk increases with age.  Family history of migraine headaches.  Being Caucasian.  Depression and anxiety.  Obesity.  Being a woman.  Having a hole in the heart (patent foramen ovale) or other heart problems.  What are the signs or symptoms? The main symptom of this condition is pulsating or throbbing pain. Pain may:  Happen in any area of the head, such as on one side or both sides.  Interfere with daily activities.  Get worse with physical activity.  Get worse with exposure to bright lights or loud noises.  Other symptoms may include:  Nausea.  Vomiting.  Dizziness.  General sensitivity to bright lights, loud noises, or smells.  Before you get a migraine, you may get warning signs that a migraine is developing (aura). An aura may include:  Seeing flashing lights or having blind spots.  Seeing bright spots, halos, or zigzag lines.  Having tunnel vision or blurred vision.  Having numbness or a tingling feeling.  Having trouble talking.  Having muscle weakness.  How is this  diagnosed? A migraine headache can be diagnosed based on:  Your symptoms.  A physical exam.  Tests, such as CT scan or MRI of the head. These imaging tests can help rule out other causes of headaches.  Taking fluid from the spine (lumbar puncture) and analyzing it (cerebrospinal fluid analysis, or CSF analysis).  How is this treated? A migraine headache is usually treated with medicines that:  Relieve pain.  Relieve nausea.  Prevent migraines from coming back.  Treatment may also include:  Acupuncture.  Lifestyle changes like avoiding foods that trigger migraines.  Follow these instructions at home: Medicines  Take over-the-counter and prescription medicines only as told by your health care provider.  Do not drive or use heavy machinery while taking prescription pain medicine.  To prevent or treat constipation while you are taking prescription pain medicine, your health care provider may recommend that you: ? Drink enough fluid to keep your urine clear or pale yellow. ? Take over-the-counter or prescription medicines. ? Eat foods that are high in fiber, such as fresh fruits and vegetables, whole grains, and beans. ? Limit foods that are high in fat and processed sugars, such as fried and sweet foods. Lifestyle  Avoid alcohol use.  Do not use any products that contain nicotine or tobacco, such as cigarettes and e-cigarettes. If you need help quitting, ask your health care provider.  Get at least 8 hours of sleep every night.  Limit your stress. General instructions   Keep a journal to find out what may trigger your migraine headaches. For example, write down: ? What you eat and   drink. ? How much sleep you get. ? Any change to your diet or medicines.  If you have a migraine: ? Avoid things that make your symptoms worse, such as bright lights. ? It may help to lie down in a dark, quiet room. ? Do not drive or use heavy machinery. ? Ask your health care provider  what activities are safe for you while you are experiencing symptoms.  Keep all follow-up visits as told by your health care provider. This is important. Contact a health care provider if:  You develop symptoms that are different or more severe than your usual migraine symptoms. Get help right away if:  Your migraine becomes severe.  You have a fever.  You have a stiff neck.  You have vision loss.  Your muscles feel weak or like you cannot control them.  You start to lose your balance often.  You develop trouble walking.  You faint. This information is not intended to replace advice given to you by your health care provider. Make sure you discuss any questions you have with your health care provider. Document Released: 10/03/2005 Document Revised: 04/22/2016 Document Reviewed: 03/21/2016 Elsevier Interactive Patient Education  2017 Elsevier Inc.   

## 2016-12-08 NOTE — Assessment & Plan Note (Signed)
Tolerating statin, encouraged heart healthy diet, avoid trans fats, minimize simple carbs and saturated fats. Increase exercise as tolerated 

## 2016-12-08 NOTE — Assessment & Plan Note (Signed)
Encouraged to get adequate exercise, calcium and vitamin d intake 

## 2016-12-08 NOTE — Progress Notes (Signed)
Patient ID: [name], female   DOB: 09-12-1951, 66 y.o.   MRN: [id number]   Subjective:    Patient ID: Donna Guerra, female    DOB: 31-Dec-1950, 66 y.o.   MRN: [id number]  No chief complaint on file.   HPI Patient is in today for follow up on hyperlipidemia, osteopenia and migraine headache. She reports she is feeling well today. No recent febrile illness or hospitalizations. Her headaches are well controlled. Denies CP/palp/SOB/HA/congestion/fevers/GI or GU c/o. Taking meds as prescribed  Past Medical History:  Diagnosis Date  . CIN I (cervical intraepithelial neoplasia I) 2005  . Colon polyps 07/08/2012  . Hyperlipidemia 08/29/2012  . Migraines   . Osteopenia 01/2016   T score -1.7 FRAX 8.5%/1.1%  . Right shoulder pain 07/08/2012  . Skin cancer 07/08/2012   Basal cell    Past Surgical History:  Procedure Laterality Date  . ABDOMINAL SURGERY     Tummy tuck  . [place]  . CHOLECYSTECTOMY    . COLPOSCOPY    . GYNECOLOGIC CRYOSURGERY    . OOPHORECTOMY  2002   BSO  . SHOULDER SURGERY     right rotator cuff repair  . TONSILLECTOMY AND ADENOIDECTOMY     age 23  . TUBAL LIGATION    . VAGINAL HYSTERECTOMY  2002   LAVH BSO    Family History  Problem Relation Age of Onset  . Hypertension Mother   . Breast cancer Mother 73  . Breast cancer Maternal Aunt 80  . Breast cancer Maternal Aunt     50's  . Migraines Father   . Migraines Sister   . Cancer Sister     Melanoma  . Diabetes Maternal Grandmother     Social History   Social History  . Marital status: Married    Spouse name: N/A  . Number of children: N/A  . Years of education: N/A   Occupational History  . Not on file.   Social History Main Topics  . Smoking status: Never Smoker  . Smokeless tobacco: Never Used  . Alcohol use 0.0 oz/week     Comment: occasionally  . Drug use: No  . Sexual activity: Yes    Partners: Male    Birth control/ protection: Surgical     Comment:  HYST-1st intercourse 66 yo-Fewer than 5 partners   Other Topics Concern  . Not on file   Social History Narrative  . No narrative on file    Outpatient Medications Prior to Visit  Medication Sig Dispense Refill  . Cholecalciferol (VITAMIN D PO) Take 2,000 Units by mouth.    . Cyanocobalamin (VITAMIN B-12 PO) Take by mouth daily. Take 2500 daily    . estradiol (ESTRACE) 1 MG tablet Take 1 tablet (1 mg total) by mouth daily. 30 tablet 12  . fish oil-omega-3 fatty acids 1000 MG capsule Take 1 g by mouth daily.    . NONFORMULARY OR COMPOUNDED ITEM Estradiol vaginal 0.02% cream insert twice weekly vaginally 90 each 3  . atorvastatin (LIPITOR) 40 MG tablet Take 1 tablet (40 mg total) by mouth daily. 30 tablet 11  . SUMAtriptan (IMITREX) 100 MG tablet Take 1 tablet (100 mg total) by mouth as directed. 9 tablet 11   No facility-administered medications prior to visit.     No Known Allergies  Review of Systems  Constitutional: Negative for fever and malaise/fatigue.  HENT: Negative for congestion.   Eyes: Negative for blurred vision.  Respiratory: Negative  for shortness of breath.   Cardiovascular: Negative for chest pain, palpitations and leg swelling.  Gastrointestinal: Negative for abdominal pain, blood in stool and nausea.  Genitourinary: Negative for dysuria and frequency.  Musculoskeletal: Negative for falls.  Skin: Negative for rash.  Neurological: Negative for dizziness, loss of consciousness and headaches.  Endo/Heme/Allergies: Negative for environmental allergies.  Psychiatric/Behavioral: Negative for depression. The patient is not nervous/anxious.        Objective:    Physical Exam  Constitutional: She is oriented to person, place, and time. She appears well-developed and well-nourished. No distress.  HENT:  Head: Normocephalic and atraumatic.  Nose: Nose normal.  Eyes: Right eye exhibits no discharge. Left eye exhibits no discharge.  Neck: Normal range of motion.  Neck supple.  Cardiovascular: Normal rate and regular rhythm.   No murmur heard. Pulmonary/Chest: Effort normal and breath sounds normal.  Abdominal: Soft. Bowel sounds are normal. There is no tenderness.  Musculoskeletal: She exhibits no edema.  Neurological: She is alert and oriented to person, place, and time.  Skin: Skin is warm and dry.  Psychiatric: She has a normal mood and affect.  Nursing note and vitals reviewed.   BP 114/75 (BP Location: Left Arm, Patient Position: Sitting, Cuff Size: Normal)   Pulse 69   Temp 98.1 F (36.7 C) (Oral)   Resp 18   Wt 120 lb 12.8 oz (54.8 kg)   SpO2 100%   BMI 21.40 kg/m  Wt Readings from Last 3 Encounters:  12/06/16 120 lb 12.8 oz (54.8 kg)  06/06/16 123 lb (55.8 kg)  01/06/16 123 lb (55.8 kg)     Lab Results  Component Value Date   WBC 8.4 06/06/2016   HGB 14.0 06/06/2016   HCT 40.7 06/06/2016   PLT 263.0 06/06/2016   GLUCOSE 110 (H) 06/06/2016   CHOL 185 06/06/2016   TRIG 129.0 06/06/2016   HDL 60.60 06/06/2016   LDLCALC 99 06/06/2016   ALT 24 06/06/2016   AST 22 06/06/2016   NA 138 06/06/2016   K 4.1 06/06/2016   CL 101 06/06/2016   CREATININE 0.79 06/06/2016   BUN 16 06/06/2016   CO2 28 06/06/2016   TSH 1.97 06/06/2016   HGBA1C 5.5 06/07/2016    Lab Results  Component Value Date   TSH 1.97 06/06/2016   Lab Results  Component Value Date   WBC 8.4 06/06/2016   HGB 14.0 06/06/2016   HCT 40.7 06/06/2016   MCV 95.5 06/06/2016   PLT 263.0 06/06/2016   Lab Results  Component Value Date   NA 138 06/06/2016   K 4.1 06/06/2016   CO2 28 06/06/2016   GLUCOSE 110 (H) 06/06/2016   BUN 16 06/06/2016   CREATININE 0.79 06/06/2016   BILITOT 0.8 06/06/2016   ALKPHOS 84 06/06/2016   AST 22 06/06/2016   ALT 24 06/06/2016   PROT 7.2 06/06/2016   ALBUMIN 4.5 06/06/2016   CALCIUM 9.6 06/06/2016   GFR 77.12 06/06/2016   Lab Results  Component Value Date   CHOL 185 06/06/2016   Lab Results  Component Value Date     HDL 60.60 06/06/2016   Lab Results  Component Value Date   LDLCALC 99 06/06/2016   Lab Results  Component Value Date   TRIG 129.0 06/06/2016   Lab Results  Component Value Date   CHOLHDL 3 06/06/2016   Lab Results  Component Value Date   HGBA1C 5.5 06/07/2016       Assessment & Plan:   Problem  List Items Addressed This Visit    Osteopenia    Encouraged to get adequate exercise, calcium and vitamin d intake      Migraines   Relevant Medications   atorvastatin (LIPITOR) 40 MG tablet   SUMAtriptan (IMITREX) 100 MG tablet   Hyperlipidemia    Tolerating statin, encouraged heart healthy diet, avoid trans fats, minimize simple carbs and saturated fats. Increase exercise as tolerated      Relevant Medications   atorvastatin (LIPITOR) 40 MG tablet      I am having Ms. Hungate maintain her fish oil-omega-3 fatty acids, Cholecalciferol (VITAMIN D PO), Cyanocobalamin (VITAMIN B-12 PO), estradiol, NONFORMULARY OR COMPOUNDED ITEM, atorvastatin, and SUMAtriptan.  Meds ordered this encounter  Medications  . atorvastatin (LIPITOR) 40 MG tablet    Sig: Take 1 tablet (40 mg total) by mouth daily.    Dispense:  30 tablet    Refill:  6  . SUMAtriptan (IMITREX) 100 MG tablet    Sig: Take 1 tablet (100 mg total) by mouth as directed.    Dispense:  9 tablet    Refill:  11     Penni Homans, MD

## 2016-12-12 ENCOUNTER — Ambulatory Visit: Payer: PPO | Admitting: Family Medicine

## 2017-01-03 DIAGNOSIS — C44519 Basal cell carcinoma of skin of other part of trunk: Secondary | ICD-10-CM | POA: Diagnosis not present

## 2017-01-03 DIAGNOSIS — D2272 Melanocytic nevi of left lower limb, including hip: Secondary | ICD-10-CM | POA: Diagnosis not present

## 2017-01-03 DIAGNOSIS — L820 Inflamed seborrheic keratosis: Secondary | ICD-10-CM | POA: Diagnosis not present

## 2017-01-03 DIAGNOSIS — C44619 Basal cell carcinoma of skin of left upper limb, including shoulder: Secondary | ICD-10-CM | POA: Diagnosis not present

## 2017-01-03 DIAGNOSIS — D1801 Hemangioma of skin and subcutaneous tissue: Secondary | ICD-10-CM | POA: Diagnosis not present

## 2017-01-03 DIAGNOSIS — L821 Other seborrheic keratosis: Secondary | ICD-10-CM | POA: Diagnosis not present

## 2017-01-03 DIAGNOSIS — D485 Neoplasm of uncertain behavior of skin: Secondary | ICD-10-CM | POA: Diagnosis not present

## 2017-01-03 DIAGNOSIS — L570 Actinic keratosis: Secondary | ICD-10-CM | POA: Diagnosis not present

## 2017-01-06 ENCOUNTER — Ambulatory Visit (INDEPENDENT_AMBULATORY_CARE_PROVIDER_SITE_OTHER): Payer: PPO | Admitting: Gynecology

## 2017-01-06 ENCOUNTER — Encounter: Payer: Self-pay | Admitting: Gynecology

## 2017-01-06 VITALS — BP 112/76 | Ht 63.0 in | Wt 122.0 lb

## 2017-01-06 DIAGNOSIS — N952 Postmenopausal atrophic vaginitis: Secondary | ICD-10-CM

## 2017-01-06 DIAGNOSIS — Z79890 Hormone replacement therapy (postmenopausal): Secondary | ICD-10-CM

## 2017-01-06 DIAGNOSIS — M8580 Other specified disorders of bone density and structure, unspecified site: Secondary | ICD-10-CM

## 2017-01-06 DIAGNOSIS — Z01411 Encounter for gynecological examination (general) (routine) with abnormal findings: Secondary | ICD-10-CM | POA: Diagnosis not present

## 2017-01-06 MED ORDER — ESTRADIOL 1 MG PO TABS: 1.0000 mg | ORAL_TABLET | Freq: Every day | ORAL | 12 refills | Status: DC

## 2017-01-06 NOTE — Progress Notes (Signed)
    Donna Guerra [birth year] [id number]        65 y.o.  G2P2002 for breast and pelvic exam  Past medical history,surgical history, problem list, medications, allergies, family history and social history were all reviewed and documented as reviewed in the EPIC chart.  ROS:  Performed with pertinent positives and negatives included in the history, assessment and plan.   Additional significant findings :  none   Exam: [name] assistant Vitals:   01/06/17 1153  BP: 112/76  Weight: 122 lb (55.3 kg)  Height: 5\' 3"  (1.6 m)   Body mass index is 21.61 kg/m.  General appearance:  Normal affect, orientation and appearance. Skin: Grossly normal HEENT: Without gross lesions.  No cervical or supraclavicular adenopathy. Thyroid normal.  Lungs:  Clear without wheezing, rales or rhonchi Cardiac: RR, without RMG Abdominal:  Soft, nontender, without masses, guarding, rebound, organomegaly or hernia Breasts:  Examined lying and sitting without masses, retractions, discharge or axillary adenopathy. Pelvic:  Ext, BUS, Vagina: with atrophic changes  Adnexa: Without masses or tenderness    Anus and perineum: Normal   Rectovaginal: Normal sphincter tone without palpated masses or tenderness.    Assessment/Plan:  66 y.o. [id number] female for breast and pelvic exam.   1. [name] for bleeding, pain, leiomyoma, simple hyperplasia and adenomyosis. On estradiol 1 mg daily and estradiol vaginal cream twice weekly. Has tried weaning without success having unacceptable hot flushes sweats and vaginal dryness. I reviewed the current studies to include the 2017 NAMS guidelines. Benefits to include symptom relief and possible cardiovascular/bone health noting history of osteopenia with early initiation versus risks to include thrombosis such as stroke heart attack DVT and the breast cancer issue. After a lengthy discussion the patient wants to continue and I refilled her for both 1  year. 2. Osteopenia.  DEXA 01/2016 T score -1.7 FRAX 8.5%/1.1%. Plan repeat DEXA next year. 3. Pap smear 2016. No Pap smear done today. History of LGSIL in the past. Options to stop screening based on hysterectomy and age reviewed. Will readdress on annual basis. 4. Mammography 05/2016. Continue with annual mammography when due. SBE monthly reviewed. 5. Colonoscopy 2016. Repeat at their recommended interval. 6. Health maintenance. No routine lab work done as this is done elsewhere. Follow up 1 year, sooner as needed.   Anastasio Auerbach MD, 12:19 PM 01/06/2017

## 2017-01-06 NOTE — Patient Instructions (Signed)

## 2017-03-15 ENCOUNTER — Telehealth: Payer: Self-pay | Admitting: Family Medicine

## 2017-03-15 NOTE — Telephone Encounter (Signed)
Caller name: Iona Beard Relationship to patient: Husband Can be reached: [phone]  Pharmacy:  Reason for call: Husband request a call back. He will explain what he needs when call returned.

## 2017-03-15 NOTE — Telephone Encounter (Signed)
Pharmacy called back and they were no help they had just helped him with the coupon. Advise if you know how to write this and he then can use the coupon at [facility]

## 2017-03-15 NOTE — Telephone Encounter (Signed)
I have never prescribed this med and because it is a brand name it will likely require a prior auth that as a PCP I will be unlikely to get approved. I could refer her to a dermatologist who would have a better chance of getting it approved.

## 2017-03-15 NOTE — Telephone Encounter (Signed)
Spoke to the patients husband. He would like to get her for her birthday present (Latisse eye lash, help them grow) . He states is very expensive through a Psychiatric nurse and he would like a prescription and can take to [facility] and get for her and way less expensive. He also has a coupon. Called [facility] on [address] (he had spoken to the pharmacist) left her a message to call us back to inform how to write this prescription

## 2017-03-15 NOTE — Telephone Encounter (Signed)
Called left message to call back 

## 2017-03-16 NOTE — Telephone Encounter (Signed)
Latisse 1 drop gtt qpm to upper eyelid margin disp #1 with 1 rf

## 2017-03-16 NOTE — Telephone Encounter (Signed)
Spoke to the husband and he is not going to put this through insurance---but just to pay out of pocket for  It. He states this is just for eye lashes and getting her for her Rudene Anda

## 2017-03-16 NOTE — Telephone Encounter (Signed)
Phoned in prescription to [facility] the husband informed phoned into pharmacy

## 2017-04-11 ENCOUNTER — Encounter: Payer: Self-pay | Admitting: Family Medicine

## 2017-04-11 ENCOUNTER — Ambulatory Visit (INDEPENDENT_AMBULATORY_CARE_PROVIDER_SITE_OTHER): Payer: PPO | Admitting: Family Medicine

## 2017-04-11 ENCOUNTER — Ambulatory Visit (HOSPITAL_BASED_OUTPATIENT_CLINIC_OR_DEPARTMENT_OTHER)
Admission: RE | Admit: 2017-04-11 | Discharge: 2017-04-11 | Disposition: A | Discharge status: Home / Self Care | Payer: PPO | Source: Ambulatory Visit | Attending: Family Medicine | Admitting: Family Medicine

## 2017-04-11 VITALS — BP 98/64 | HR 73 | Temp 97.8°F | Resp 18 | Wt 121.8 lb

## 2017-04-11 DIAGNOSIS — S3992XA Unspecified injury of lower back, initial encounter: Secondary | ICD-10-CM | POA: Diagnosis not present

## 2017-04-11 DIAGNOSIS — M4186 Other forms of scoliosis, lumbar region: Secondary | ICD-10-CM | POA: Diagnosis not present

## 2017-04-11 DIAGNOSIS — M4316 Spondylolisthesis, lumbar region: Secondary | ICD-10-CM | POA: Insufficient documentation

## 2017-04-11 DIAGNOSIS — M545 Low back pain, unspecified: Secondary | ICD-10-CM

## 2017-04-11 DIAGNOSIS — M549 Dorsalgia, unspecified: Secondary | ICD-10-CM

## 2017-04-11 DIAGNOSIS — N200 Calculus of kidney: Secondary | ICD-10-CM | POA: Diagnosis not present

## 2017-04-11 DIAGNOSIS — M8580 Other specified disorders of bone density and structure, unspecified site: Secondary | ICD-10-CM | POA: Diagnosis not present

## 2017-04-11 HISTORY — DX: Dorsalgia, unspecified: M54.9

## 2017-04-11 MED ORDER — METHYLPREDNISOLONE 4 MG PO TABS: ORAL_TABLET | ORAL | 0 refills | Status: DC

## 2017-04-11 MED ORDER — TIZANIDINE HCL 4 MG PO TABS: 4.0000 mg | ORAL_TABLET | Freq: Four times a day (QID) | ORAL | 0 refills | Status: DC | PRN

## 2017-04-11 MED FILL — METHYLPREDNISOLONE 4 MG TAB: 4 | 5 days supply | Qty: 15 | Fill #0

## 2017-04-11 MED FILL — tiZANidine HCL 4 MG TABS: 4 | 8 days supply | Qty: 30 | Fill #0

## 2017-04-11 NOTE — Assessment & Plan Note (Signed)
Will proceed with xray of lumbar spine due to recent trauma and point tenderness over lumbar spine with palpation

## 2017-04-11 NOTE — Progress Notes (Signed)
Subjective:  I acted as a Education administrator for Dr. Charlett Blake. [place], [place]  Patient ID: Donna Guerra, female    DOB: May 05, 1951, 66 y.o.   MRN: [id number]  Chief Complaint  Patient presents with  . Back Pain    HPI  Patient is in today for an acute visit. Patient states she fell last night and hurt her lower back. Fell last night on stairs while trying to take her dog outside. Fell down 3 stairs and hit the righ side of her midback. Has had right sided back pain radiating to posterior right hip ever since. Best when standing but still present. No head trauma, LOC, sob, palp or other acute concerns  Patient Care Team: Mosie Lukes, MD as PCP - General (Family Medicine) Fontaine, Belinda Block, MD as Consulting Physician (Gynecology) Arta Silence, MD as Consulting Physician (Gastroenterology) Allyn Kenner, MD (Dermatology) Manning Charity, MD (Plastic Surgery)   Past Medical History:  Diagnosis Date  . Back pain 04/11/2017  . CIN I (cervical intraepithelial neoplasia I) 2005  . Colon polyps 07/08/2012  . Hyperlipidemia 08/29/2012  . Migraines   . Osteopenia 01/2016   T score -1.7 FRAX 8.5%/1.1%  . Right shoulder pain 07/08/2012  . Skin cancer 07/08/2012   Basal cell    Past Surgical History:  Procedure Laterality Date  . ABDOMINAL SURGERY     Tummy tuck  . [place]  . CHOLECYSTECTOMY    . COLPOSCOPY    . COSMETIC SURGERY    . GYNECOLOGIC CRYOSURGERY    . OOPHORECTOMY  2002   BSO  . SHOULDER SURGERY     right rotator cuff repair  . TONSILLECTOMY AND ADENOIDECTOMY     age 39  . TUBAL LIGATION    . VAGINAL HYSTERECTOMY  2002   [facility] BSO    Family History  Problem Relation Age of Onset  . Hypertension Mother   . Breast cancer Mother 96  . Breast cancer Maternal Aunt 80  . Breast cancer Maternal Aunt        50's  . Migraines Father   . Migraines Sister   . Cancer Sister        Melanoma  . Diabetes Maternal Grandmother     Social History   Social  History  . Marital status: Married    Spouse name: N/A  . Number of children: N/A  . Years of education: N/A   Occupational History  . Not on file.   Social History Main Topics  . Smoking status: Never Smoker  . Smokeless tobacco: Never Used  . Alcohol use 0.0 oz/week     Comment: occasionally  . Drug use: No  . Sexual activity: Yes    Partners: Male    Birth control/ protection: Surgical     Comment: HYST-1st intercourse 66 yo-Fewer than 5 partners   Other Topics Concern  . Not on file   Social History Narrative  . No narrative on file    Outpatient Medications Prior to Visit  Medication Sig Dispense Refill  . atorvastatin (LIPITOR) 40 MG tablet Take 1 tablet (40 mg total) by mouth daily. 30 tablet 6  . Cholecalciferol (VITAMIN D PO) Take 2,000 Units by mouth.    . Cyanocobalamin (VITAMIN B-12 PO) Take by mouth daily. Take 2500 daily    . estradiol (ESTRACE) 1 MG tablet Take 1 tablet (1 mg total) by mouth daily. 30 tablet 12  . fish oil-omega-3 fatty acids 1000 MG  capsule Take 1 g by mouth daily.    . NONFORMULARY OR COMPOUNDED ITEM Estradiol vaginal 0.02% cream insert twice weekly vaginally 90 each 3  . SUMAtriptan (IMITREX) 100 MG tablet Take 1 tablet (100 mg total) by mouth as directed. 9 tablet 11   No facility-administered medications prior to visit.     No Known Allergies  Review of Systems  Constitutional: Negative for fever and malaise/fatigue.  HENT: Negative for congestion.   Eyes: Negative for blurred vision.  Respiratory: Negative for cough and shortness of breath.   Cardiovascular: Negative for chest pain, palpitations and leg swelling.  Gastrointestinal: Negative for vomiting.  Musculoskeletal: Positive for back pain, falls and joint pain. Negative for neck pain.  Skin: Negative for rash.  Neurological: Negative for loss of consciousness and headaches.       Objective:    Physical Exam  Constitutional: She is oriented to person, place, and  time. She appears well-developed and well-nourished. No distress.  HENT:  Head: Normocephalic and atraumatic.  Eyes: Conjunctivae are normal.  Neck: Normal range of motion. No thyromegaly present.  Cardiovascular: Normal rate and regular rhythm.   Pulmonary/Chest: Effort normal and breath sounds normal. She has no wheezes.  Abdominal: Soft. Bowel sounds are normal. There is no tenderness.  Musculoskeletal: Normal range of motion. She exhibits tenderness. She exhibits no edema or deformity.  Pain over mid lumbar spine with palpation and pain over musculature right flank. No pain over sacroiliac joint with palp. Walking with cane favoring right lower extremity  Neurological: She is alert and oriented to person, place, and time.  Skin: Skin is warm and dry. She is not diaphoretic.  Psychiatric: She has a normal mood and affect.    BP 98/64 (BP Location: Left Arm, Patient Position: Sitting, Cuff Size: Normal)   Pulse 73   Temp 97.8 F (36.6 C) (Oral)   Resp 18   Wt 121 lb 12.8 oz (55.2 kg)   SpO2 97%   BMI 21.58 kg/m  Wt Readings from Last 3 Encounters:  04/11/17 121 lb 12.8 oz (55.2 kg)  01/06/17 122 lb (55.3 kg)  12/06/16 120 lb 12.8 oz (54.8 kg)   BP Readings from Last 3 Encounters:  04/11/17 98/64  01/06/17 112/76  12/06/16 114/75     Immunization History  Administered Date(s) Administered  . Influenza Whole 07/18/2011, 08/01/2012, 06/28/2013  . Zoster 08/07/2012    Health Maintenance  Topic Date Due  . Hepatitis C Screening  [birth year]  . PNA vac Low Risk Adult (1 of 2 - PCV13) 04/12/2014  . PAP SMEAR  12/12/2015  . INFLUENZA VACCINE  01/15/2019 (Originally 05/17/2017)  . MAMMOGRAM  05/18/2018  . TETANUS/TDAP  11/03/2020  . COLONOSCOPY  05/21/2025  . DEXA SCAN  Completed    Lab Results  Component Value Date   WBC 8.4 06/06/2016   HGB 14.0 06/06/2016   HCT 40.7 06/06/2016   PLT 263.0 06/06/2016   GLUCOSE 110 (H) 06/06/2016   CHOL 185 06/06/2016   TRIG 129.0  06/06/2016   HDL 60.60 06/06/2016   LDLCALC 99 06/06/2016   ALT 24 06/06/2016   AST 22 06/06/2016   NA 138 06/06/2016   K 4.1 06/06/2016   CL 101 06/06/2016   CREATININE 0.79 06/06/2016   BUN 16 06/06/2016   CO2 28 06/06/2016   TSH 1.97 06/06/2016   HGBA1C 5.5 06/07/2016    Lab Results  Component Value Date   TSH 1.97 06/06/2016   Lab Results  Component  Value Date   WBC 8.4 06/06/2016   HGB 14.0 06/06/2016   HCT 40.7 06/06/2016   MCV 95.5 06/06/2016   PLT 263.0 06/06/2016   Lab Results  Component Value Date   NA 138 06/06/2016   K 4.1 06/06/2016   CO2 28 06/06/2016   GLUCOSE 110 (H) 06/06/2016   BUN 16 06/06/2016   CREATININE 0.79 06/06/2016   BILITOT 0.8 06/06/2016   ALKPHOS 84 06/06/2016   AST 22 06/06/2016   ALT 24 06/06/2016   PROT 7.2 06/06/2016   ALBUMIN 4.5 06/06/2016   CALCIUM 9.6 06/06/2016   GFR 77.12 06/06/2016   Lab Results  Component Value Date   CHOL 185 06/06/2016   Lab Results  Component Value Date   HDL 60.60 06/06/2016   Lab Results  Component Value Date   LDLCALC 99 06/06/2016   Lab Results  Component Value Date   TRIG 129.0 06/06/2016   Lab Results  Component Value Date   CHOLHDL 3 06/06/2016   Lab Results  Component Value Date   HGBA1C 5.5 06/07/2016         Assessment & Plan:   Problem List Items Addressed This Visit    Osteopenia    Will proceed with xray of lumbar spine due to recent trauma and point tenderness over lumbar spine with palpation      Back pain - Primary    Fell last night on stairs while trying to take her dog outside. Fell down 3 stairs and hit the righ side of her midback. Has had right sided back pain radiating to posterior right hip ever since. Best when standing but still present. Proceed with xray of lumbar spine, medrol dosepak, Tizanidine prn, tylenol and lidocaine pain patches. Report if no improvement      Relevant Medications   methylPREDNISolone (MEDROL) 4 MG tablet   tiZANidine  (ZANAFLEX) 4 MG tablet   Other Relevant Orders   DG Lumbar Spine Complete      I am having Ms. Sill start on methylPREDNISolone and tiZANidine. I am also having her maintain her fish oil-omega-3 fatty acids, Cholecalciferol (VITAMIN D PO), Cyanocobalamin (VITAMIN B-12 PO), NONFORMULARY OR COMPOUNDED ITEM, atorvastatin, SUMAtriptan, and estradiol.  Meds ordered this encounter  Medications  . methylPREDNISolone (MEDROL) 4 MG tablet    Sig: 5 tab po qd X 1d then 4 tab po qd X 1d then 3 tab po qd X 1d then 2 tab po qd then 1 tab po qd    Dispense:  15 tablet    Refill:  0  . tiZANidine (ZANAFLEX) 4 MG tablet    Sig: Take 1 tablet (4 mg total) by mouth every 6 (six) hours as needed for muscle spasms.    Dispense:  30 tablet    Refill:  0    CMA served as scribe during this visit. History, Physical and Plan performed by medical provider. Documentation and orders reviewed and attested to.  Penni Homans, MD

## 2017-04-11 NOTE — Addendum Note (Signed)
Addended by: [name] A on: 04/11/2017 03:01 PM   Modules accepted: Orders

## 2017-04-11 NOTE — Patient Instructions (Signed)
24 to 36 hours of ice followed by roughly 3 days of alternating Moist heat and ice as tolerated, Tylenol EX 500 mg tabs, 2 twice daily as needed x 5 days orso Lidocaine patches daily to affected area (Aspercreme, Icy hot, [employer])  Call if worsens or does not improve Back Pain, Adult Back pain is very common in adults.The cause of back pain is rarely dangerous and the pain often gets better over time.The cause of your back pain may not be known. Some common causes of back pain include:  Strain of the muscles or ligaments supporting the spine.  Wear and tear (degeneration) of the spinal disks.  Arthritis.  Direct injury to the back.  For many people, back pain may return. Since back pain is rarely dangerous, most people can learn to manage this condition on their own. Follow these instructions at home: Watch your back pain for any changes. The following actions may help to lessen any discomfort you are feeling:  Remain active. It is stressful on your back to sit or stand in one place for long periods of time. Do not sit, drive, or stand in one place for more than 30 minutes at a time. Take short walks on even surfaces as soon as you are able.Try to increase the length of time you walk each day.  Exercise regularly as directed by your health care provider. Exercise helps your back heal faster. It also helps avoid future injury by keeping your muscles strong and flexible.  Do not stay in bed.Resting more than 1-2 days can delay your recovery.  Pay attention to your body when you bend and lift. The most comfortable positions are those that put less stress on your recovering back. Always use proper lifting techniques, including: ? Bending your knees. ? Keeping the load close to your body. ? Avoiding twisting.  Find a comfortable position to sleep. Use a firm mattress and lie on your side with your knees slightly bent. If you lie on your back, put a pillow under your knees.  Avoid  feeling anxious or stressed.Stress increases muscle tension and can worsen back pain.It is important to recognize when you are anxious or stressed and learn ways to manage it, such as with exercise.  Take medicines only as directed by your health care provider. Over-the-counter medicines to reduce pain and inflammation are often the most helpful.Your health care provider may prescribe muscle relaxant drugs.These medicines help dull your pain so you can more quickly return to your normal activities and healthy exercise.  Apply ice to the injured area: ? Put ice in a plastic bag. ? Place a towel between your skin and the bag. ? Leave the ice on for 20 minutes, 2-3 times a day for the first 2-3 days. After that, ice and heat may be alternated to reduce pain and spasms.  Maintain a healthy weight. Excess weight puts extra stress on your back and makes it difficult to maintain good posture.  Contact a health care provider if:  You have pain that is not relieved with rest or medicine.  You have increasing pain going down into the legs or buttocks.  You have pain that does not improve in one week.  You have night pain.  You lose weight.  You have a fever or chills. Get help right away if:  You develop new bowel or bladder control problems.  You have unusual weakness or numbness in your arms or legs.  You develop nausea or vomiting.  You develop abdominal pain.  You feel faint. This information is not intended to replace advice given to you by your health care provider. Make sure you discuss any questions you have with your health care provider. Document Released: 10/03/2005 Document Revised: 02/11/2016 Document Reviewed: 02/04/2014 Elsevier Interactive Patient Education  2017 [employer].

## 2017-04-11 NOTE — Assessment & Plan Note (Signed)
Fell last night on stairs while trying to take her dog outside. Fell down 3 stairs and hit the righ side of her midback. Has had right sided back pain radiating to posterior right hip ever since. Best when standing but still present. Proceed with xray of lumbar spine, medrol dosepak, Tizanidine prn, tylenol and lidocaine pain patches. Report if no improvement

## 2017-04-14 ENCOUNTER — Telehealth: Payer: Self-pay | Admitting: Family Medicine

## 2017-04-14 NOTE — Telephone Encounter (Signed)
Pt called in because she have further questions about her x-ray results that's on my chart. She says that she spoke with a nurse but would like to speak with her PCP directly if possible. Offered an office visit to discuss directly with PCP, pt declined. She states that she have trouble getting in and out of cars so she is unable to come in.   She would like a call back directly at : [phone]

## 2017-04-14 NOTE — Telephone Encounter (Signed)
Spoke with patient to have her to understand. Her back is not broken, she does have a kidney stone and Dr. Charlett Blake wants her to come in for further testing she stated she will wait til the weekend to see how she feels.  PC

## 2017-05-17 ENCOUNTER — Ambulatory Visit (INDEPENDENT_AMBULATORY_CARE_PROVIDER_SITE_OTHER): Payer: PPO | Admitting: Medical

## 2017-05-17 ENCOUNTER — Encounter: Payer: Self-pay | Admitting: Medical

## 2017-05-17 VITALS — BP 111/74 | HR 73 | Temp 98.0°F | Resp 16 | Ht 63.0 in | Wt 122.0 lb

## 2017-05-17 DIAGNOSIS — S46812A Strain of other muscles, fascia and tendons at shoulder and upper arm level, left arm, initial encounter: Secondary | ICD-10-CM | POA: Diagnosis not present

## 2017-05-17 DIAGNOSIS — J0110 Acute frontal sinusitis, unspecified: Secondary | ICD-10-CM

## 2017-05-17 DIAGNOSIS — J0100 Acute maxillary sinusitis, unspecified: Secondary | ICD-10-CM

## 2017-05-17 DIAGNOSIS — H1031 Unspecified acute conjunctivitis, right eye: Secondary | ICD-10-CM | POA: Diagnosis not present

## 2017-05-17 MED ORDER — CEFDINIR 300 MG PO CAPS: 300.0000 mg | ORAL_CAPSULE | Freq: Two times a day (BID) | ORAL | 0 refills | Status: DC

## 2017-05-17 MED ORDER — KETOROLAC TROMETHAMINE 60 MG/2ML IM SOLN: 60.0000 mg | Freq: Once | INTRAMUSCULAR | Status: DC

## 2017-05-17 MED ORDER — KETOROLAC TROMETHAMINE 60 MG/2ML IM SOLN
60.0000 mg | Freq: Once | INTRAMUSCULAR | Status: AC
  Administered 2017-05-17: 60 mg via INTRAMUSCULAR

## 2017-05-17 MED ORDER — TOBRAMYCIN 0.3 % OP SOLN: 2.0000 [drp] | Freq: Four times a day (QID) | OPHTHALMIC | 0 refills | Status: DC

## 2017-05-17 MED ORDER — FLUTICASONE PROPIONATE 50 MCG/ACT NA SUSP: 2.0000 | Freq: Every day | NASAL | 1 refills | Status: DC

## 2017-05-17 MED FILL — CEFDINIR 300 MG CAPSULE: 300 | 10 days supply | Qty: 20 | Fill #0

## 2017-05-17 MED FILL — TOBRAMYCIN 0.3% EYE DROPS: 0.3 | 13 days supply | Qty: 5 | Fill #0

## 2017-05-17 MED FILL — FLUTICASONE PROP 50 MCG SPR: 50 | 30 days supply | Qty: 16 | Fill #0
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
## 2017-05-17 NOTE — Addendum Note (Signed)
Addended by: Hinton Dyer on: 05/17/2017 11:52 AM   Modules accepted: Orders

## 2017-05-18 ENCOUNTER — Other Ambulatory Visit: Payer: Self-pay | Admitting: Gynecology

## 2017-05-18 DIAGNOSIS — Z1231 Encounter for screening mammogram for malignant neoplasm of breast: Secondary | ICD-10-CM

## 2017-05-31 ENCOUNTER — Ambulatory Visit
Admission: RE | Admit: 2017-05-31 | Discharge: 2017-05-31 | Disposition: A | Discharge status: Home / Self Care | Payer: PPO | Source: Ambulatory Visit | Attending: Gynecology | Admitting: Gynecology

## 2017-05-31 DIAGNOSIS — Z1231 Encounter for screening mammogram for malignant neoplasm of breast: Secondary | ICD-10-CM | POA: Diagnosis not present

## 2017-06-02 ENCOUNTER — Other Ambulatory Visit: Payer: Self-pay | Admitting: Gynecology

## 2017-06-02 DIAGNOSIS — R928 Other abnormal and inconclusive findings on diagnostic imaging of breast: Secondary | ICD-10-CM

## 2017-06-06 ENCOUNTER — Ambulatory Visit
Admission: RE | Admit: 2017-06-06 | Discharge: 2017-06-06 | Disposition: A | Discharge status: Home / Self Care | Payer: PPO | Source: Ambulatory Visit | Attending: Gynecology | Admitting: Gynecology

## 2017-06-06 ENCOUNTER — Telehealth: Payer: Self-pay | Admitting: *Deleted

## 2017-06-06 ENCOUNTER — Ambulatory Visit: Payer: PPO

## 2017-06-06 DIAGNOSIS — R922 Inconclusive mammogram: Secondary | ICD-10-CM | POA: Diagnosis not present

## 2017-06-06 DIAGNOSIS — R928 Other abnormal and inconclusive findings on diagnostic imaging of breast: Secondary | ICD-10-CM

## 2017-06-06 NOTE — Telephone Encounter (Signed)
Pt received call from breast center to have diag. bilateral mammogram spoke states radiologist mentioned to her about a cancer risk factor test. Pt has dense breast, family history of breast cancer in mother and other family members. I explained to patient we do refer patient to [facility] for genetic counseling pt wanted to spoke with office # given [phone], pt asked about cost. I told her I was unsure of cost, but she could call and check. Pt will call back to follow-up.

## 2017-06-08 NOTE — Progress Notes (Signed)
Subjective:   Donna Guerra is a 66 y.o. female who presents for Medicare Annual (Subsequent) preventive examination.  Review of Systems:  Cardiac Risk Factors include: advanced age (>30men, >68 women);dyslipidemia  No ROS.  Medicare Wellness Visit. Additional risk factors are reflected in the social history.   Home Safety/Smoke Alarms: Feels safe in home. Smoke alarms in place.   Seat Belt Safety/Bike Helmet: Wears seat belt.   Female:   Pap- Hysterectomy      Mammo-UTD    Dexa scan-UTD        CCS-UTD   Objective:     Vitals: BP 100/60 (BP Location: Left Arm, Patient Position: Sitting, Cuff Size: Normal)   Pulse 65   Temp (!) 97.5 F (36.4 C) (Oral)   Resp 18   Wt 122 lb 9.6 oz (55.6 kg)   SpO2 99%   BMI 21.72 kg/m   Body mass index is 21.72 kg/m.   Tobacco History  Smoking Status  . Never Smoker  Smokeless Tobacco  . Never Used     Counseling given: Not Answered   Past Medical History:  Diagnosis Date  . Back pain 04/11/2017  . CIN I (cervical intraepithelial neoplasia I) 2005  . Colon polyps 07/08/2012  . Hyperglycemia 06/13/2017  . Hyperlipidemia 08/29/2012  . Migraines   . Osteopenia 01/2016   T score -1.7 FRAX 8.5%/1.1%  . Right shoulder pain 07/08/2012  . Skin cancer 07/08/2012   Basal cell   Past Surgical History:  Procedure Laterality Date  . ABDOMINAL SURGERY     Tummy tuck  . [place]  . CHOLECYSTECTOMY    . COLPOSCOPY    . COSMETIC SURGERY    . GYNECOLOGIC CRYOSURGERY    . OOPHORECTOMY  2002   BSO  . SHOULDER SURGERY     right rotator cuff repair  . TONSILLECTOMY AND ADENOIDECTOMY     age 77  . TUBAL LIGATION    . VAGINAL HYSTERECTOMY  2002   [facility] BSO   Family History  Problem Relation Age of Onset  . Hypertension Mother   . Breast cancer Mother 56  . Breast cancer Maternal Aunt 80  . Breast cancer Maternal Aunt        50's  . Migraines Father   . Migraines Sister   . Cancer Sister        Melanoma   . Diabetes Maternal Grandmother    History  Sexual Activity  . Sexual activity: Yes  . Partners: Male  . Birth control/ protection: Surgical    Comment: HYST-1st intercourse 66 yo-Fewer than 5 partners    Outpatient Encounter Prescriptions as of 06/13/2017  Medication Sig  . atorvastatin (LIPITOR) 40 MG tablet Take 1 tablet (40 mg total) by mouth daily.  . Cholecalciferol (VITAMIN D PO) Take 2,000 Units by mouth.  . Cyanocobalamin (VITAMIN B-12 PO) Take by mouth daily. Take 2500 daily  . estradiol (ESTRACE) 1 MG tablet Take 1 tablet (1 mg total) by mouth daily.  . fish oil-omega-3 fatty acids 1000 MG capsule Take 1 g by mouth daily.  . fluticasone (FLONASE) 50 MCG/ACT nasal spray Place 2 sprays into both nostrils daily.  . NONFORMULARY OR COMPOUNDED ITEM Estradiol vaginal 0.02% cream insert twice weekly vaginally  . SUMAtriptan (IMITREX) 100 MG tablet Take 1 tablet (100 mg total) by mouth as directed.  . [DISCONTINUED] atorvastatin (LIPITOR) 40 MG tablet Take 1 tablet (40 mg total) by mouth daily.  . [DISCONTINUED]  HYDROcodone-acetaminophen (NORCO/VICODIN) 5-325 MG tablet   . [DISCONTINUED] cefdinir (OMNICEF) 300 MG capsule Take 1 capsule (300 mg total) by mouth 2 (two) times daily.  . [DISCONTINUED] tobramycin (TOBREX) 0.3 % ophthalmic solution Place 2 drops into the right eye every 6 (six) hours.   No facility-administered encounter medications on file as of 06/13/2017.     Activities of Daily Living In your present state of health, do you have any difficulty performing the following activities: 06/13/2017  Hearing? N  Vision? N  Comment wears reading glasses.  yearly eye exams  Difficulty concentrating or making decisions? N  Walking or climbing stairs? N  Dressing or bathing? N  Doing errands, shopping? N  Preparing Food and eating ? N  Using the Toilet? N  In the past six months, have you accidently leaked urine? N  Do you have problems with loss of bowel control? N    Managing your Medications? N  Managing your Finances? N  Housekeeping or managing your Housekeeping? N  Some recent data might be hidden    Patient Care Team: Mosie Lukes, MD as PCP - General (Family Medicine) Fontaine, Belinda Block, MD as Consulting Physician (Gynecology) Arta Silence, MD as Consulting Physician (Gastroenterology) Allyn Kenner, MD (Dermatology) Manning Charity, MD (Plastic Surgery)    Assessment:    Physical assessment deferred to PCP.  Exercise Activities and Dietary recommendations Current Exercise Habits: Home exercise routine, Type of exercise: strength training/weights;walking, Time (Minutes): 30, Intensity: Mild   Goals    . Maintain current health      Fall Risk Fall Risk  12/29/2014  Falls in the past year? No   Depression Screen PHQ 2/9 Scores 06/13/2017 06/13/2017 06/06/2016 12/29/2014  PHQ - 2 Score 0 0 0 0     Cognitive Function Ad8 score reviewed for issues:  Issues making decisions:no   Less interest in hobbies / activities:no  Repeats questions, stories (family complaining):no  Trouble using ordinary gadgets (microwave, computer, phone):no  Forgets the month or year: no  Mismanaging finances: no  Remembering appts:no  Daily problems with thinking and/or memory:no Ad8 score is=0        Immunization History  Administered Date(s) Administered  . Influenza Whole 07/18/2011, 08/01/2012, 06/28/2013  . Zoster 08/07/2012   Screening Tests Health Maintenance  Topic Date Due  . Hepatitis C Screening  11-08-1950  . PNA vac Low Risk Adult (1 of 2 - PCV13) 04/12/2014  . PAP SMEAR  12/12/2015  . INFLUENZA VACCINE  01/15/2019 (Originally 05/17/2017)  . MAMMOGRAM  06/01/2019  . TETANUS/TDAP  11/03/2020  . COLONOSCOPY  05/21/2025  . DEXA SCAN  Completed      Plan:   Follow up with PCP as directed.   I have personally reviewed and noted the following in the patient's chart:   . Medical and social history . Use of alcohol,  tobacco or illicit drugs  . Current medications and supplements . Functional ability and status . Nutritional status . Physical activity . Advanced directives . List of other physicians . Hospitalizations, surgeries, and ER visits in previous 12 months . Vitals . Screenings to include cognitive, depression, and falls . Referrals and appointments  In addition, I have reviewed and discussed with patient certain preventive protocols, quality metrics, and best practice recommendations. A written personalized care plan for preventive services as well as general preventive health recommendations were provided to patient.     Naaman Plummer [place], [place]  06/13/2017

## 2017-06-12 ENCOUNTER — Encounter: Payer: PPO | Admitting: Genetics

## 2017-06-12 ENCOUNTER — Other Ambulatory Visit: Payer: PPO

## 2017-06-13 ENCOUNTER — Encounter: Payer: Self-pay | Admitting: Family Medicine

## 2017-06-13 ENCOUNTER — Ambulatory Visit (INDEPENDENT_AMBULATORY_CARE_PROVIDER_SITE_OTHER): Payer: PPO | Admitting: Family Medicine

## 2017-06-13 VITALS — BP 100/60 | HR 65 | Temp 97.5°F | Resp 18 | Wt 122.6 lb

## 2017-06-13 DIAGNOSIS — E785 Hyperlipidemia, unspecified: Secondary | ICD-10-CM | POA: Diagnosis not present

## 2017-06-13 DIAGNOSIS — R739 Hyperglycemia, unspecified: Secondary | ICD-10-CM | POA: Diagnosis not present

## 2017-06-13 DIAGNOSIS — M8580 Other specified disorders of bone density and structure, unspecified site: Secondary | ICD-10-CM

## 2017-06-13 DIAGNOSIS — Z0000 Encounter for general adult medical examination without abnormal findings: Secondary | ICD-10-CM

## 2017-06-13 HISTORY — DX: Hyperglycemia, unspecified: R73.9

## 2017-06-13 MED ORDER — ATORVASTATIN CALCIUM 40 MG PO TABS: 40.0000 mg | ORAL_TABLET | Freq: Every day | ORAL | 6 refills | Status: DC

## 2017-06-13 NOTE — Assessment & Plan Note (Signed)
Patient encouraged to maintain heart healthy diet, regular exercise, adequate sleep. Consider daily probiotics. Take medications as prescribed. Check labs 

## 2017-06-13 NOTE — Assessment & Plan Note (Signed)
Encouraged heart healthy diet, increase exercise, avoid trans fats, consider a krill oil cap daily 

## 2017-06-13 NOTE — Assessment & Plan Note (Signed)
minimize simple carbs. Increase exercise as tolerated.  

## 2017-06-13 NOTE — Assessment & Plan Note (Signed)
Encouraged to get adequate exercise, calcium and vitamin d intake 

## 2017-06-13 NOTE — Patient Instructions (Addendum)
shingrix is the new insurance shot. 2 shots over 6 months  Preventive Care 65 Years and Older, Female Preventive care refers to lifestyle choices and visits with your health care provider that can promote health and wellness. What does preventive care include?  A yearly physical exam. This is also called an annual well check.  Dental exams once or twice a year.  Routine eye exams. Ask your health care provider how often you should have your eyes checked.  Personal lifestyle choices, including: ? Daily care of your teeth and gums. ? Regular physical activity. ? Eating a healthy diet. ? Avoiding tobacco and drug use. ? Limiting alcohol use. ? Practicing safe sex. ? Taking low-dose aspirin every day. ? Taking vitamin and mineral supplements as recommended by your health care provider. What happens during an annual well check? The services and screenings done by your health care provider during your annual well check will depend on your age, overall health, lifestyle risk factors, and family history of disease. Counseling Your health care provider may ask you questions about your:  Alcohol use.  Tobacco use.  Drug use.  Emotional well-being.  Home and relationship well-being.  Sexual activity.  Eating habits.  History of falls.  Memory and ability to understand (cognition).  Work and work Statistician.  Reproductive health.  Screening You may have the following tests or measurements:  Height, weight, and BMI.  Blood pressure.  Lipid and cholesterol levels. These may be checked every 5 years, or more frequently if you are over 16 years old.  Skin check.  Lung cancer screening. You may have this screening every year starting at age 79 if you have a 30-pack-year history of smoking and currently smoke or have quit within the past 15 years.  Fecal occult blood test (FOBT) of the stool. You may have this test every year starting at age 68.  Flexible sigmoidoscopy  or colonoscopy. You may have a sigmoidoscopy every 5 years or a colonoscopy every 10 years starting at age 72.  Hepatitis C blood test.  Hepatitis B blood test.  Sexually transmitted disease (STD) testing.  Diabetes screening. This is done by checking your blood sugar (glucose) after you have not eaten for a while (fasting). You may have this done every 1-3 years.  Bone density scan. This is done to screen for osteoporosis. You may have this done starting at age 22.  Mammogram. This may be done every 1-2 years. Talk to your health care provider about how often you should have regular mammograms.  Talk with your health care provider about your test results, treatment options, and if necessary, the need for more tests. Vaccines Your health care provider may recommend certain vaccines, such as:  Influenza vaccine. This is recommended every year.  Tetanus, diphtheria, and acellular pertussis (Tdap, Td) vaccine. You may need a Td booster every 10 years.  Varicella vaccine. You may need this if you have not been vaccinated.  Zoster vaccine. You may need this after age 45.  Measles, mumps, and rubella (MMR) vaccine. You may need at least one dose of MMR if you were born in 1957 or later. You may also need a second dose.  Pneumococcal 13-valent conjugate (PCV13) vaccine. One dose is recommended after age 60.  Pneumococcal polysaccharide (PPSV23) vaccine. One dose is recommended after age 63.  Meningococcal vaccine. You may need this if you have certain conditions.  Hepatitis A vaccine. You may need this if you have certain conditions or if you  travel or work in places where you may be exposed to hepatitis A.  Hepatitis B vaccine. You may need this if you have certain conditions or if you travel or work in places where you may be exposed to hepatitis B.  Haemophilus influenzae type b (Hib) vaccine. You may need this if you have certain conditions.  Talk to your health care provider  about which screenings and vaccines you need and how often you need them. This information is not intended to replace advice given to you by your health care provider. Make sure you discuss any questions you have with your health care provider. Document Released: 10/30/2015 Document Revised: 06/22/2016 Document Reviewed: 08/04/2015 Elsevier Interactive Patient Education  2017 [place]. Donna Guerra , Thank you for taking time to come for your Medicare Wellness Visit. I appreciate your ongoing commitment to your health goals. Please review the following plan we discussed and let me know if I can assist you in the future.   These are the goals we discussed: Goals    . Maintain current health       This is a list of the screening recommended for you and due dates:  Health Maintenance  Topic Date Due  .  Hepatitis C: One time screening is recommended by Center for Disease Control  (CDC) for  adults born from [birth year] through 1967.   October 24, 1950  . Pneumonia vaccines (1 of 2 - PCV13) 04/12/2014  . Pap Smear  12/12/2015  . Flu Shot  01/15/2019*  . Mammogram  06/01/2019  . Tetanus Vaccine  11/03/2020  . Colon Cancer Screening  05/21/2025  . DEXA scan (bone density measurement)  Completed  *Topic was postponed. The date shown is not the original due date.

## 2017-06-14 LAB — COMPREHENSIVE METABOLIC PANEL
ALT: 26 U/L (ref 0–35)
AST: 24 U/L (ref 0–37)
Albumin: 4.4 g/dL (ref 3.5–5.2)
Alkaline Phosphatase: 70 U/L (ref 39–117)
BILIRUBIN TOTAL: 1 mg/dL (ref 0.2–1.2)
BUN: 16 mg/dL (ref 6–23)
CALCIUM: 10.1 mg/dL (ref 8.4–10.5)
CHLORIDE: 100 meq/L (ref 96–112)
CO2: 30 meq/L (ref 19–32)
CREATININE: 0.76 mg/dL (ref 0.40–1.20)
GFR: 80.4 mL/min (ref >60.00)
GLUCOSE: 81 mg/dL (ref 70–99)
Potassium: 4.2 mEq/L (ref 3.5–5.1)
SODIUM: 137 meq/L (ref 135–145)
Total Protein: 7 g/dL (ref 6.0–8.3)

## 2017-06-14 LAB — LIPID PANEL
CHOL/HDL RATIO: 3
Cholesterol: 177 mg/dL (ref 0–200)
HDL: 56.5 mg/dL (ref >39.00)
LDL CALC: 99 mg/dL (ref 0–99)
NonHDL: 120.19
TRIGLYCERIDES: 105 mg/dL (ref 0.0–149.0)
VLDL: 21 mg/dL (ref 0.0–40.0)

## 2017-06-14 LAB — CBC
HCT: 40.5 % (ref 36.0–46.0)
HEMOGLOBIN: 13.9 g/dL (ref 12.0–15.0)
MCHC: 34.2 g/dL (ref 30.0–36.0)
MCV: 98.4 fl (ref 78.0–100.0)
Platelets: 275 10*3/uL (ref 150.0–400.0)
RBC: 4.12 Mil/uL (ref 3.87–5.11)
RDW: 12.9 % (ref 11.5–15.5)
WBC: 8.6 10*3/uL (ref 4.0–10.5)

## 2017-06-14 LAB — HEMOGLOBIN A1C: HEMOGLOBIN A1C: 5.7 % (ref 4.6–6.5)

## 2017-06-14 LAB — TSH: TSH: 1.65 u[IU]/mL (ref 0.35–4.50)

## 2017-06-19 NOTE — Progress Notes (Signed)
Patient ID: Donna Guerra, female   DOB: 18-Sep-1951, 66 y.o.   MRN: [id number]   Subjective:    Patient ID: Donna Guerra, female    DOB: 01-07-1951, 66 y.o.   MRN: [id number]  Chief Complaint  Patient presents with  . Medicare Wellness    with RN    HPI Patient is in today for annual physical exam and follow up. She feels well today. No recent febrile illness or hospitalizations. She denies polyuria or polydipsia. Is doing well at home with ADLs at home, reports she is eating well and stays active. Denies CP/palp/SOB/HA/congestion/fevers/GI or GU c/o. Taking meds as prescribed  Past Medical History:  Diagnosis Date  . Back pain 04/11/2017  . CIN I (cervical intraepithelial neoplasia I) 2005  . Colon polyps 07/08/2012  . Hyperglycemia 06/13/2017  . Hyperlipidemia 08/29/2012  . Migraines   . Osteopenia 01/2016   T score -1.7 FRAX 8.5%/1.1%  . Right shoulder pain 07/08/2012  . Skin cancer 07/08/2012   Basal cell    Past Surgical History:  Procedure Laterality Date  . ABDOMINAL SURGERY     Tummy tuck  . [place]  . CHOLECYSTECTOMY    . COLPOSCOPY    . COSMETIC SURGERY    . GYNECOLOGIC CRYOSURGERY    . OOPHORECTOMY  2002   BSO  . SHOULDER SURGERY     right rotator cuff repair  . TONSILLECTOMY AND ADENOIDECTOMY     age 6  . TUBAL LIGATION    . VAGINAL HYSTERECTOMY  2002   LAVH BSO    Family History  Problem Relation Age of Onset  . Hypertension Mother   . Breast cancer Mother 9  . Breast cancer Maternal Aunt 80  . Breast cancer Maternal Aunt        50's  . Migraines Father   . Migraines Sister   . Cancer Sister        Melanoma  . Diabetes Maternal Grandmother     Social History   Social History  . Marital status: Married    Spouse name: N/A  . Number of children: N/A  . Years of education: N/A   Occupational History  . Not on file.   Social History Main Topics  . Smoking status: Never Smoker  . Smokeless tobacco: Never Used    . Alcohol use 0.0 oz/week     Comment: occasionally  . Drug use: No  . Sexual activity: Yes    Partners: Male    Birth control/ protection: Surgical     Comment: HYST-1st intercourse 66 yo-Fewer than 5 partners   Other Topics Concern  . Not on file   Social History Narrative  . No narrative on file    Outpatient Medications Prior to Visit  Medication Sig Dispense Refill  . Cholecalciferol (VITAMIN D PO) Take 2,000 Units by mouth.    . Cyanocobalamin (VITAMIN B-12 PO) Take by mouth daily. Take 2500 daily    . estradiol (ESTRACE) 1 MG tablet Take 1 tablet (1 mg total) by mouth daily. 30 tablet 12  . fish oil-omega-3 fatty acids 1000 MG capsule Take 1 g by mouth daily.    . fluticasone (FLONASE) 50 MCG/ACT nasal spray Place 2 sprays into both nostrils daily. 16 g 1  . NONFORMULARY OR COMPOUNDED ITEM Estradiol vaginal 0.02% cream insert twice weekly vaginally 90 each 3  . SUMAtriptan (IMITREX) 100 MG tablet Take 1 tablet (100 mg total) by mouth as directed.  9 tablet 11  . atorvastatin (LIPITOR) 40 MG tablet Take 1 tablet (40 mg total) by mouth daily. 30 tablet 6  . HYDROcodone-acetaminophen (NORCO/VICODIN) 5-325 MG tablet     . cefdinir (OMNICEF) 300 MG capsule Take 1 capsule (300 mg total) by mouth 2 (two) times daily. 20 capsule 0  . tobramycin (TOBREX) 0.3 % ophthalmic solution Place 2 drops into the right eye every 6 (six) hours. 5 mL 0   No facility-administered medications prior to visit.     No Known Allergies  Review of Systems  Constitutional: Negative for fever and malaise/fatigue.  HENT: Negative for congestion and ear pain.   Eyes: Negative for blurred vision.  Respiratory: Negative for shortness of breath.   Cardiovascular: Negative for chest pain, palpitations and leg swelling.  Gastrointestinal: Negative for abdominal pain, blood in stool and nausea.  Genitourinary: Negative for dysuria and frequency.  Musculoskeletal: Negative for falls.  Skin: Negative for  rash.  Neurological: Negative for dizziness, loss of consciousness and headaches.  Endo/Heme/Allergies: Negative for environmental allergies.  Psychiatric/Behavioral: Negative for depression. The patient is not nervous/anxious.        Objective:    Physical Exam  Constitutional: She is oriented to person, place, and time. She appears well-developed and well-nourished. No distress.  HENT:  Head: Normocephalic and atraumatic.  Eyes: Conjunctivae are normal.  Neck: Neck supple. No thyromegaly present.  Cardiovascular: Normal rate, regular rhythm and normal heart sounds.   No murmur heard. Pulmonary/Chest: Effort normal and breath sounds normal. No respiratory distress.  Abdominal: Soft. Bowel sounds are normal. She exhibits no distension and no mass. There is no tenderness.  Musculoskeletal: She exhibits no edema.  Lymphadenopathy:    She has no cervical adenopathy.  Neurological: She is alert and oriented to person, place, and time.  Skin: Skin is warm and dry.  Psychiatric: She has a normal mood and affect. Her behavior is normal.    BP 100/60 (BP Location: Left Arm, Patient Position: Sitting, Cuff Size: Normal)   Pulse 65   Temp (!) 97.5 F (36.4 C) (Oral)   Resp 18   Wt 122 lb 9.6 oz (55.6 kg)   SpO2 99%   BMI 21.72 kg/m  Wt Readings from Last 3 Encounters:  06/13/17 122 lb 9.6 oz (55.6 kg)  05/17/17 122 lb (55.3 kg)  04/11/17 121 lb 12.8 oz (55.2 kg)     Lab Results  Component Value Date   WBC 8.6 06/13/2017   HGB 13.9 06/13/2017   HCT 40.5 06/13/2017   PLT 275.0 06/13/2017   GLUCOSE 81 06/13/2017   CHOL 177 06/13/2017   TRIG 105.0 06/13/2017   HDL 56.50 06/13/2017   LDLCALC 99 06/13/2017   ALT 26 06/13/2017   AST 24 06/13/2017   NA 137 06/13/2017   K 4.2 06/13/2017   CL 100 06/13/2017   CREATININE 0.76 06/13/2017   BUN 16 06/13/2017   CO2 30 06/13/2017   TSH 1.65 06/13/2017   HGBA1C 5.7 06/13/2017    Lab Results  Component Value Date   TSH 1.65  06/13/2017   Lab Results  Component Value Date   WBC 8.6 06/13/2017   HGB 13.9 06/13/2017   HCT 40.5 06/13/2017   MCV 98.4 06/13/2017   PLT 275.0 06/13/2017   Lab Results  Component Value Date   NA 137 06/13/2017   K 4.2 06/13/2017   CO2 30 06/13/2017   GLUCOSE 81 06/13/2017   BUN 16 06/13/2017   CREATININE 0.76 06/13/2017  BILITOT 1.0 06/13/2017   ALKPHOS 70 06/13/2017   AST 24 06/13/2017   ALT 26 06/13/2017   PROT 7.0 06/13/2017   ALBUMIN 4.4 06/13/2017   CALCIUM 10.1 06/13/2017   GFR 80.40 06/13/2017   Lab Results  Component Value Date   CHOL 177 06/13/2017   Lab Results  Component Value Date   HDL 56.50 06/13/2017   Lab Results  Component Value Date   LDLCALC 99 06/13/2017   Lab Results  Component Value Date   TRIG 105.0 06/13/2017   Lab Results  Component Value Date   CHOLHDL 3 06/13/2017   Lab Results  Component Value Date   HGBA1C 5.7 06/13/2017       Assessment & Plan:   Problem List Items Addressed This Visit    Osteopenia    Encouraged to get adequate exercise, calcium and vitamin d intake      Preventative health care    Patient encouraged to maintain heart healthy diet, regular exercise, adequate sleep. Consider daily probiotics. Take medications as prescribed. Check labs      Relevant Orders   CBC (Completed)   TSH (Completed)   Hyperlipidemia    Encouraged heart healthy diet, increase exercise, avoid trans fats, consider a krill oil cap daily      Relevant Medications   atorvastatin (LIPITOR) 40 MG tablet   Other Relevant Orders   Lipid panel (Completed)   Hyperglycemia    minimize simple carbs. Increase exercise as tolerated.       Relevant Orders   Hemoglobin A1c (Completed)   Comprehensive metabolic panel (Completed)    Other Visit Diagnoses    Encounter for Medicare annual wellness exam    -  Primary      I have discontinued Ms. Granberry's HYDROcodone-acetaminophen, cefdinir, and tobramycin. I am also having her  maintain her fish oil-omega-3 fatty acids, Cholecalciferol (VITAMIN D PO), Cyanocobalamin (VITAMIN B-12 PO), NONFORMULARY OR COMPOUNDED ITEM, SUMAtriptan, estradiol, fluticasone, and atorvastatin.  Meds ordered this encounter  Medications  . atorvastatin (LIPITOR) 40 MG tablet    Sig: Take 1 tablet (40 mg total) by mouth daily.    Dispense:  30 tablet    Refill:  6     Penni Homans, MD

## 2017-06-26 ENCOUNTER — Telehealth: Payer: Self-pay | Admitting: Family Medicine

## 2017-06-26 DIAGNOSIS — E785 Hyperlipidemia, unspecified: Secondary | ICD-10-CM

## 2017-06-26 MED ORDER — ATORVASTATIN CALCIUM 40 MG PO TABS: 40.0000 mg | ORAL_TABLET | Freq: Every day | ORAL | 11 refills | Status: DC

## 2017-06-26 NOTE — Telephone Encounter (Signed)
Relation to pt: self  Call back number:[phone] Pharmacy: [place], [place] - [address]  Reason for call:  Patient states as per PCP stated she would refill for 12 months   atorvastatin (LIPITOR) 40 MG tablet due to lab results. Patient requesting new Rx reflecting 12 refills please send to pharmacy

## 2017-06-26 NOTE — Telephone Encounter (Signed)
Medication sent to pharmacy    PC 

## 2017-07-19 ENCOUNTER — Encounter: Payer: Self-pay | Admitting: Genetic Counselor

## 2017-07-19 ENCOUNTER — Ambulatory Visit (HOSPITAL_BASED_OUTPATIENT_CLINIC_OR_DEPARTMENT_OTHER): Payer: PPO | Admitting: Genetic Counselor

## 2017-07-19 ENCOUNTER — Other Ambulatory Visit: Payer: PPO

## 2017-07-19 DIAGNOSIS — Z803 Family history of malignant neoplasm of breast: Secondary | ICD-10-CM | POA: Diagnosis not present

## 2017-07-19 DIAGNOSIS — Z800 Family history of malignant neoplasm of digestive organs: Secondary | ICD-10-CM

## 2017-07-19 DIAGNOSIS — Z808 Family history of malignant neoplasm of other organs or systems: Secondary | ICD-10-CM

## 2017-07-19 DIAGNOSIS — Z8042 Family history of malignant neoplasm of prostate: Secondary | ICD-10-CM | POA: Diagnosis not present

## 2017-07-19 DIAGNOSIS — D126 Benign neoplasm of colon, unspecified: Secondary | ICD-10-CM

## 2017-07-19 NOTE — Progress Notes (Signed)
REFERRING PROVIDER: Mosie Lukes, MD [address] [place], [place] [address]  PRIMARY PROVIDER:  Mosie Lukes, MD  PRIMARY REASON FOR VISIT:  1. Adenomatous polyp of colon, unspecified part of colon   2. Family history of breast cancer   3. Family history of colon cancer   4. Family history of prostate cancer   5. Family history of melanoma      HISTORY OF PRESENT ILLNESS:   Donna Guerra, a 66 y.o. female, was seen for a  cancer genetics consultation at the request of Dr. Charlett Blake due to a family history of cancer.  Donna Guerra presents to clinic today to discuss the possibility of a hereditary predisposition to cancer, genetic testing, and to further clarify her future cancer risks, as well as potential cancer risks for family members. Donna Guerra is a 66 y.o. female with no personal history of cancer.  She is concerned about the family history of cancer, and family history of Lynch syndrome in her niece.  CANCER HISTORY:   No history exists.     HORMONAL RISK FACTORS:  Menarche was at age 86.  First live birth at age 36.  OCP use for approximately 13 years.  Ovaries intact: no.  Hysterectomy: yes.  Menopausal status: postmenopausal.  HRT use: 16 years. Colonoscopy: yes; Multiple polyps. Mammogram within the last year: yes. Number of breast biopsies: 0. Up to date with pelvic exams:  yes. Any excessive radiation exposure in the past:  no  Past Medical History:  Diagnosis Date  . Back pain 04/11/2017  . CIN I (cervical intraepithelial neoplasia I) 2005  . Colon polyps 07/08/2012  . Family history of breast cancer   . Family history of colon cancer   . Family history of melanoma   . Family history of prostate cancer   . Hyperglycemia 06/13/2017  . Hyperlipidemia 08/29/2012  . Migraines   . Osteopenia 01/2016   T score -1.7 FRAX 8.5%/1.1%  . Right shoulder pain 07/08/2012  . Skin cancer 07/08/2012   Basal cell    Past Surgical History:    Procedure Laterality Date  . ABDOMINAL SURGERY     Tummy tuck  . [place]  . CHOLECYSTECTOMY    . COLPOSCOPY    . COSMETIC SURGERY    . GYNECOLOGIC CRYOSURGERY    . OOPHORECTOMY  2002   BSO  . SHOULDER SURGERY     right rotator cuff repair  . TONSILLECTOMY AND ADENOIDECTOMY     age 45  . TUBAL LIGATION    . VAGINAL HYSTERECTOMY  2002   LAVH BSO    Social History   Social History  . Marital status: Married    Spouse name: N/A  . Number of children: N/A  . Years of education: N/A   Social History Main Topics  . Smoking status: Never Smoker  . Smokeless tobacco: Never Used  . Alcohol use 0.0 oz/week     Comment: occasionally  . Drug use: No  . Sexual activity: Yes    Partners: Male    Birth control/ protection: Surgical     Comment: HYST-1st intercourse 66 yo-Fewer than 5 partners   Other Topics Concern  . Not on file   Social History Narrative  . No narrative on file     FAMILY HISTORY:  We obtained a detailed, 4-generation family history.  Significant diagnoses are listed below: Family History  Problem Relation Age of Onset  .  Hypertension Mother   . Breast cancer Mother 54  . Colon cancer Mother 31  . Breast cancer Maternal Aunt 80  . Breast cancer Maternal Aunt        50's  . Migraines Father   . Migraines Sister   . Cancer Sister        Melanoma  . Diabetes Maternal Grandmother   . Breast cancer Paternal Aunt        dx over 61  . Breast cancer Sister 57  . Cirrhosis Sister   . Breast cancer Other 66       DCIS - niece  . Prostate cancer Cousin        maternal first cousin  . Colon cancer Cousin        maternal first cousin  . Cancer Cousin        unknown cancer  . Breast cancer Cousin 23       TN breast cancer - maternal first cousin    The patient has two sons who are cancer free.  She has one sister and two brothers.  Her sister was diagnosed with melanoma at age 62. The patient also had one maternal half sister.   This sister developed breast cancer at 17 and died of liver cirrhosis at 77. This sister had five children, one who had breast cancer at 56.  This niece had genetic testing and was found to have Lynch syndrome.  The report is not available today. Both parents are deceased.    The patient's mother was diagnosed with breast cancer at age 33.  This eventually became metastatic and spread to her colon at age 16.  She had two brothers and two sisters.  One brother did not have cancer but had three children with cancer - a son with prostate cancer, a son with colon cancer and a daughter with an unknown cancer.  One sister had breast cancer at 79 and died at 42.  This sister had one daughter who has TN breast cancer at age 51.  She had genetic testing that found a VUS but is otherwise negative.  The maternal grandparents are deceased from non cancer related issues.  The patient's father died of old age at 80.  He was one of 13 children.  He had one sister with breast cancer.  There is no other reported family history of cancer on that side of the family.  Patient's maternal ancestors are of Northern European descent, and paternal ancestors are of Northern European descent. There is no reported Ashkenazi Jewish ancestry. There is no known consanguinity.  GENETIC COUNSELING ASSESSMENT: Donna Guerra is a 66 y.o. female with a family history of cancer and family history of Lynch syndrome which is somewhat suggestive of a predisposition to cancer. We, therefore, discussed and recommended the following at today's visit.   DISCUSSION: We discussed that about 5-10% of breast cancer is hereditary with most cases due to BRCA mutations.  About 5-6% of colon cancer is hereditary with most cases due to Lynch syndrome.  In some cases, breast cancer can also be associated with Lynch syndrome.  The patient's niece was diagnosed with Lynch syndrome.  This niece is the patient's maternal half sister's daughter.  Therefore the  patient has a 12.5% chance of also having Lynch syndrome.  We reviewed the characteristics, features and inheritance patterns of hereditary cancer syndromes. We also discussed genetic testing, including the appropriate family members to test, the process of testing, insurance coverage and  turn-around-time for results. We discussed the implications of a negative, positive and/or variant of uncertain significant result. We recommended Ms. Silverthorn pursue genetic testing for the [facility] panel. The Multi-Gene Panel offered by [facility] includes sequencing and/or deletion duplication testing of the following 83 genes: ALK, APC, ATM, AXIN2,BAP1,  BARD1, BLM, BMPR1A, BRCA1, BRCA2, BRIP1, CASR, CDC73, CDH1, CDK4, CDKN1B, CDKN1C, CDKN2A (p14ARF), CDKN2A (p16INK4a), CEBPA, CHEK2, CTNNA1, DICER1, DIS3L2, EGFR (c.2369C>T, p.Thr790Met variant only), EPCAM (Deletion/duplication testing only), FH, FLCN, GATA2, GPC3, GREM1 (Promoter region deletion/duplication testing only), HOXB13 (c.251G>A, p.Gly84Glu), HRAS, KIT, MAX, MEN1, MET, MITF (c.952G>A, p.Glu318Lys variant only), MLH1, MSH2, MSH3, MSH6, MUTYH, NBN, NF1, NF2, NTHL1, PALB2, PDGFRA, PHOX2B, PMS2, POLD1, POLE, POT1, PRKAR1A, PTCH1, PTEN, RAD50, RAD51C, RAD51D, RB1, RECQL4, RET, RUNX1, SDHAF2, SDHA (sequence changes only), SDHB, SDHC, SDHD, SMAD4, SMARCA4, SMARCB1, SMARCE1, STK11, SUFU, TERT, TERT, TMEM127, TP53, TSC1, TSC2, VHL, WRN and WT1.    Based on Ms. Brucato's family history of cancer, she meets medical criteria for genetic testing. Despite that she meets criteria, she may still have an out of pocket cost. We discussed that if her out of pocket cost for testing is over $100, the laboratory will call and confirm whether she wants to proceed with testing.  If the out of pocket cost of testing is less than $100 she will be billed by the genetic testing laboratory.   Based on the patient's personal and family history, statistical models (Tyrer Cusik)  and literature  data were used to estimate her risk of developing breast cancer. These estimate her lifetime risk of developing breast cancer to be approximately 16.2%. This estimation does not take into account any genetic testing results.  The patient's lifetime breast cancer risk is a preliminary estimate based on available information using one of several models endorsed by the [place] (ACS). The ACS recommends consideration of breast MRI screening as an adjunct to mammography for patients at high risk (defined as 20% or greater lifetime risk). A more detailed breast cancer risk assessment can be considered, if clinically indicated.     PLAN: After considering the risks, benefits, and limitations, Ms. Peaster  provided informed consent to pursue genetic testing and the blood sample was sent to [facility] for analysis of the Multi-Gene panel. Results should be available within approximately 2-3 weeks' time, at which point they will be disclosed by telephone to Ms. Margolis, as will any additional recommendations warranted by these results. Ms. Blickenstaff will receive a summary of her genetic counseling visit and a copy of her results once available. This information will also be available in Epic. We encouraged Ms. Tanabe to remain in contact with cancer genetics annually so that we can continuously update the family history and inform her of any changes in cancer genetics and testing that may be of benefit for her family. Ms. Surprenant questions were answered to her satisfaction today. Our contact information was provided should additional questions or concerns arise.  Lastly, we encouraged Ms. Whitsell to remain in contact with cancer genetics annually so that we can continuously update the family history and inform her of any changes in cancer genetics and testing that may be of benefit for this family.   Ms.  Westhoff questions were answered to her satisfaction today. Our contact information  was provided should additional questions or concerns arise. Thank you for the referral and allowing [place] to share in the care of your patient.   Deward Sebek P. Florene Glen, [place], [employer] Certified Genetic Counselor [email or web address] phone:  [phone]  The patient was seen for a total of 60 minutes in face-to-face genetic counseling.  This patient was discussed with Drs. Magrinat, Lindi Adie and/or Burr Medico who agrees with the above.    _______________________________________________________________________ For Office Staff:  Number of people involved in session: 2 Was an Intern/ student involved with case: no

## 2017-08-04 ENCOUNTER — Telehealth: Payer: Self-pay | Admitting: Genetic Counselor

## 2017-08-04 ENCOUNTER — Encounter: Payer: Self-pay | Admitting: Genetic Counselor

## 2017-08-04 DIAGNOSIS — Z1379 Encounter for other screening for genetic and chromosomal anomalies: Secondary | ICD-10-CM | POA: Insufficient documentation

## 2017-08-04 NOTE — Telephone Encounter (Signed)
LM on VM with good news.  Asked that she CB. 

## 2017-08-08 ENCOUNTER — Ambulatory Visit: Payer: Self-pay | Admitting: Genetic Counselor

## 2017-08-08 DIAGNOSIS — Z808 Family history of malignant neoplasm of other organs or systems: Secondary | ICD-10-CM

## 2017-08-08 DIAGNOSIS — Z800 Family history of malignant neoplasm of digestive organs: Secondary | ICD-10-CM

## 2017-08-08 DIAGNOSIS — Z803 Family history of malignant neoplasm of breast: Secondary | ICD-10-CM

## 2017-08-08 DIAGNOSIS — Z1379 Encounter for other screening for genetic and chromosomal anomalies: Secondary | ICD-10-CM

## 2017-08-08 DIAGNOSIS — D126 Benign neoplasm of colon, unspecified: Secondary | ICD-10-CM

## 2017-08-08 DIAGNOSIS — Z8042 Family history of malignant neoplasm of prostate: Secondary | ICD-10-CM

## 2017-08-08 NOTE — Progress Notes (Signed)
HPI: Ms. Debarge was previously seen in the [place] clinic due to a family history of cancer, a niece with Lynch syndrome and concerns regarding a hereditary predisposition to cancer. Please refer to our prior cancer genetics clinic note for more information regarding Ms. Jaco's medical, social and family histories, and our assessment and recommendations, at the time. Ms. Keinath recent genetic test results were disclosed to her, as were recommendations warranted by these results. These results and recommendations are discussed in more detail below.  CANCER HISTORY:   No history exists.    FAMILY HISTORY:  We obtained a detailed, 4-generation family history.  Significant diagnoses are listed below: Family History  Problem Relation Age of Onset  . Hypertension Mother   . Breast cancer Mother 38  . Colon cancer Mother 29  . Breast cancer Maternal Aunt 80  . Breast cancer Maternal Aunt        50's  . Migraines Father   . Migraines Sister   . Cancer Sister        Melanoma  . Diabetes Maternal Grandmother   . Breast cancer Paternal Aunt        dx over 17  . Breast cancer Sister 42  . Cirrhosis Sister   . Breast cancer Other 61       DCIS - niece  . Prostate cancer Cousin        maternal first cousin  . Colon cancer Cousin        maternal first cousin  . Cancer Cousin        unknown cancer  . Breast cancer Cousin 25       TN breast cancer - maternal first cousin    The patient has two sons who are cancer free.  She has one sister and two brothers.  Her sister was diagnosed with melanoma at age 19. The patient also had one maternal half sister.  This sister developed breast cancer at 44 and died of liver cirrhosis at 69. This sister had five children, one who had breast cancer at 13.  This niece had genetic testing and was found to have Lynch syndrome.  The report is not available today. Both parents are deceased.    The patient's mother was diagnosed with  breast cancer at age 71.  This eventually became metastatic and spread to her colon at age 75.  She had two brothers and two sisters.  One brother did not have cancer but had three children with cancer - a son with prostate cancer, a son with colon cancer and a daughter with an unknown cancer.  One sister had breast cancer at 51 and died at 30.  This sister had one daughter who has [place] breast cancer at age 62.  She had genetic testing that found a VUS but is otherwise negative.  The maternal grandparents are deceased from non cancer related issues.  The patient's father died of old age at 14.  He was one of 13 children.  He had one sister with breast cancer.  There is no other reported family history of cancer on that side of the family.  Patient's maternal ancestors are of Northern European descent, and paternal ancestors are of Northern European descent. There is no reported Ashkenazi Jewish ancestry. There is no known consanguinity.  GENETIC TEST RESULTS: Genetic testing reported out on August 03, 2017 through the Multi-gene cancer panel found no deleterious mutations.  The Multi-Gene Panel offered by [facility] includes sequencing and/or  deletion duplication testing of the following 80 genes: ALK, APC, ATM, AXIN2,BAP1,  BARD1, BLM, BMPR1A, BRCA1, BRCA2, BRIP1, CASR, CDC73, CDH1, CDK4, CDKN1B, CDKN1C, CDKN2A (p14ARF), CDKN2A (p16INK4a), CEBPA, CHEK2, CTNNA1, DICER1, DIS3L2, EGFR (c.2369C>T, p.Thr790Met variant only), EPCAM (Deletion/duplication testing only), FH, FLCN, GATA2, GPC3, GREM1 (Promoter region deletion/duplication testing only), HOXB13 (c.251G>A, p.Gly84Glu), HRAS, KIT, MAX, MEN1, MET, MITF (c.952G>A, p.Glu318Lys variant only), MLH1, MSH2, MSH3, MSH6, MUTYH, NBN, NF1, NF2, NTHL1, PALB2, PDGFRA, PHOX2B, PMS2, POLD1, POLE, POT1, PRKAR1A, PTCH1, PTEN, RAD50, RAD51C, RAD51D, RB1, RECQL4, RET, RUNX1, SDHAF2, SDHA (sequence changes only), SDHB, SDHC, SDHD, SMAD4, SMARCA4, SMARCB1, SMARCE1, STK11, SUFU,  TERT, TERT, TMEM127, TP53, TSC1, TSC2, VHL, WRN and WT1.  The test report has been scanned into EPIC and is located under the Molecular Pathology section of the Results Review tab.   We discussed with Ms. Coggin that since the current genetic testing is not perfect, it is possible there may be a gene mutation in one of these genes that current testing cannot detect, but that chance is small. We also discussed, that it is possible that another gene that has not yet been discovered, or that we have not yet tested, is responsible for the cancer diagnoses in the family, and it is, therefore, important to remain in touch with cancer genetics in the future so that we can continue to offer Ms. Wickes the most up to date genetic testing.   Genetic testing did detect a Variant of Unknown Significance in the MSH3 gene called c.2041C>T (p.Pro681Ser). At this time, it is unknown if this variant is associated with increased cancer risk or if this is a normal finding, but most variants such as this get reclassified to being inconsequential. It should not be used to make medical management decisions. With time, we suspect the lab will determine the significance of this variant, if any. If we do learn more about it, we will try to contact Ms. Dokes to discuss it further. However, it is important to stay in touch with [place] periodically and keep the address and phone number up to date.    CANCER SCREENING RECOMMENDATIONS:  This normal result is reassuring and indicates that Ms. Inboden does not likely have an increased risk of cancer due to a mutation in one of these genes.  Specifically, she does not have a hereditary mutation within any of the Lynch syndrome genes, which reportedly was identified in her niece.  We, therefore, recommended  Ms. Stennis continue to follow the cancer screening guidelines provided by her primary healthcare providers.   Based on the Ms. Thai's personal and family history of cancer, as  well as her genetic test results, statistical models (Tyrer Cusik)  and literature data were used to estimate her risk of developing breast cancer. These estimate her lifetime risk of developing breast cancer to be approximately 15.8%.  The patient's lifetime breast cancer risk is a preliminary estimate based on available information using one of several models endorsed by the American Cancer Society (ACS). The ACS recommends consideration of breast MRI screening as an adjunct to mammography for patients at high risk (defined as 20% or greater lifetime risk). A more detailed breast cancer risk assessment can be considered, if clinically indicated.     RECOMMENDATIONS FOR FAMILY MEMBERS: Women in this family might be at some increased risk of developing cancer, over the general population risk, simply due to the family history of cancer. We recommended women in this family have a yearly mammogram beginning at age 61,  or 10 years younger than the earliest onset of cancer, an annual clinical breast exam, and perform monthly breast self-exams. Women in this family should also have a gynecological exam as recommended by their primary provider. All family members should have a colonoscopy by age 43.  FOLLOW-UP: Lastly, we discussed with Ms. Bilbo that cancer genetics is a rapidly advancing field and it is possible that new genetic tests will be appropriate for her and/or her family members in the future. We encouraged her to remain in contact with cancer genetics on an annual basis so we can update her personal and family histories and let her know of advances in cancer genetics that may benefit this family.   Our contact number was provided. Ms. Saner questions were answered to her satisfaction, and she knows she is welcome to call us at anytime with additional questions or concerns.   Roma Kayser, MS, [facility] Certified Genetic Counselor [email or web address]

## 2017-08-21 ENCOUNTER — Telehealth: Payer: Self-pay | Admitting: Family Medicine

## 2017-08-21 NOTE — Telephone Encounter (Signed)
Patient called in and stts she needed a refill on her atorvastatin Patient stts she was suppose to have a six months refill but pharm stts she didn't have it.   CB- [id number].

## 2017-08-22 NOTE — Telephone Encounter (Signed)
Spoke with pharmacist he stated pt has refills waiting to be picked up. Butpt wants her bottle to say she has refills for a year.

## 2017-08-29 NOTE — Telephone Encounter (Signed)
error 

## 2017-08-31 ENCOUNTER — Ambulatory Visit: Payer: PPO | Admitting: Family Medicine

## 2017-08-31 ENCOUNTER — Encounter: Payer: Self-pay | Admitting: Family Medicine

## 2017-08-31 VITALS — BP 112/64 | HR 78 | Temp 98.4°F | Ht 63.0 in | Wt 123.6 lb

## 2017-08-31 DIAGNOSIS — R0989 Other specified symptoms and signs involving the circulatory and respiratory systems: Secondary | ICD-10-CM | POA: Diagnosis not present

## 2017-08-31 DIAGNOSIS — R0981 Nasal congestion: Secondary | ICD-10-CM | POA: Diagnosis not present

## 2017-08-31 DIAGNOSIS — J3489 Other specified disorders of nose and nasal sinuses: Secondary | ICD-10-CM | POA: Diagnosis not present

## 2017-08-31 MED ORDER — CEFDINIR 300 MG PO CAPS: 300.0000 mg | ORAL_CAPSULE | Freq: Two times a day (BID) | ORAL | 0 refills | Status: DC

## 2017-08-31 NOTE — Progress Notes (Signed)
[place] at [facility] [address] [place], [place] [address] [phone]  Date:  97/11/6376   Name:  Donna Guerra   DOB:  1951-03-11   MRN:  [id number]  PCP:  Mosie Lukes, MD    Chief Complaint: Nasal Congestion (c/o nasal congestion, prod cough with clear mucus, migraines x 3 days. )   History of Present Illness:  Donna Guerra is a 66 y.o. very pleasant female patient who presents with the following: Her PCP is Dr. Charlett Blake She is here today with illness. She has noted sx over the last 4 days or so She has known history of migraine headache . Over the last few days she awoke with nausea, pressure in her neck and behing her eyes- typical of migraine for her. She would take her migraine medication- imitrex- and it would help She as noted some cold sx recently and wonders if sinus pressure could be triggering a sinus infection.   She has some chest congestion- she may cough up some thin mucus She has not noted any wheezing She always has a hard time breathing through her nose even when she is well.   She wonders if she may have a deviated septum or other issue with her nose She has never seen ENT but might like to do so at some point   She is using some flonase but does not really feel it hells that much   No diarrhea No fever or chills No unusual neurological sx- no slurred speech, no numbness or weakness in her body No rash  Patient Active Problem List   Diagnosis Date Noted  . Genetic testing 08/04/2017  . Family history of breast cancer   . Family history of colon cancer   . Family history of prostate cancer   . Family history of melanoma   . Hyperglycemia 06/13/2017  . Back pain 04/11/2017  . Preop examination 08/29/2012  . Hyperlipidemia 08/29/2012  . Right shoulder pain 07/08/2012  . Colon polyps 07/08/2012  . Skin cancer 07/08/2012  . Preventative health care 07/08/2012  . Osteopenia   . Migraines      Past Medical History:  Diagnosis Date  . Back pain 04/11/2017  . CIN I (cervical intraepithelial neoplasia I) 2005  . Colon polyps 07/08/2012  . Family history of breast cancer   . Family history of colon cancer   . Family history of melanoma   . Family history of prostate cancer   . Hyperglycemia 06/13/2017  . Hyperlipidemia 08/29/2012  . Migraines   . Osteopenia 01/2016   T score -1.7 FRAX 8.5%/1.1%  . Right shoulder pain 07/08/2012  . Skin cancer 07/08/2012   Basal cell    Past Surgical History:  Procedure Laterality Date  . ABDOMINAL SURGERY     Tummy tuck  . [place]  . CHOLECYSTECTOMY    . COLPOSCOPY    . COSMETIC SURGERY    . GYNECOLOGIC CRYOSURGERY    . OOPHORECTOMY  2002   BSO  . SHOULDER SURGERY     right rotator cuff repair  . TONSILLECTOMY AND ADENOIDECTOMY     age 65  . TUBAL LIGATION    . VAGINAL HYSTERECTOMY  2002   LAVH BSO    Social History   Tobacco Use  . Smoking status: Never Smoker  . Smokeless tobacco: Never Used  Substance Use Topics  . Alcohol use: Yes  Alcohol/week: 0.0 oz    Comment: occasionally  . Drug use: No    Family History  Problem Relation Age of Onset  . Hypertension Mother   . Breast cancer Mother 3  . Colon cancer Mother 58  . Breast cancer Maternal Aunt 80  . Breast cancer Maternal Aunt        50's  . Migraines Father   . Migraines Sister   . Cancer Sister        Melanoma  . Diabetes Maternal Grandmother   . Breast cancer Paternal Aunt        dx over 43  . Breast cancer Sister 45  . Cirrhosis Sister   . Breast cancer Other 80       DCIS - niece  . Prostate cancer Cousin        maternal first cousin  . Colon cancer Cousin        maternal first cousin  . Cancer Cousin        unknown cancer  . Breast cancer Cousin 3       TN breast cancer - maternal first cousin    No Known Allergies  Medication list has been reviewed and updated.  Current Outpatient Medications on File  Prior to Visit  Medication Sig Dispense Refill  . atorvastatin (LIPITOR) 40 MG tablet Take 1 tablet (40 mg total) by mouth daily. 30 tablet 11  . Cholecalciferol (VITAMIN D PO) Take 2,000 Units by mouth.    . Cyanocobalamin (VITAMIN B-12 PO) Take by mouth daily. Take 2500 daily    . estradiol (ESTRACE) 1 MG tablet Take 1 tablet (1 mg total) by mouth daily. 30 tablet 12  . fish oil-omega-3 fatty acids 1000 MG capsule Take 1 g by mouth daily.    . fluticasone (FLONASE) 50 MCG/ACT nasal spray Place 2 sprays into both nostrils daily. 16 g 1  . NONFORMULARY OR COMPOUNDED ITEM Estradiol vaginal 0.02% cream insert twice weekly vaginally 90 each 3  . SUMAtriptan (IMITREX) 100 MG tablet Take 1 tablet (100 mg total) by mouth as directed. 9 tablet 11   No current facility-administered medications on file prior to visit.     Review of Systems:  As per HPI- otherwise negative.   Physical Examination: Vitals:   08/31/17 1144  BP: 112/64  Pulse: 78  Temp: 98.4 F (36.9 C)  SpO2: 99%   Vitals:   08/31/17 1144  Weight: 123 lb 9.6 oz (56.1 kg)  Height: 5\' 3"  (1.6 m)   Body mass index is 21.89 kg/m. Ideal Body Weight: Weight in (lb) to have BMI = 25: 140.8  GEN: WDWN, NAD, Non-toxic, A & O x 3, normal weight, looks well HEENT: Atraumatic, Normocephalic. Neck supple. No masses, No LAD.  Bilateral TM wnl, oropharynx normal.  PEERL,EOMI.   No meningismus  Nasal cavity is small, displays mucus typical of allergies  Ears and Nose: No external deformity. CV: RRR, No M/G/R. No JVD. No thrill. No extra heart sounds. PULM: CTA B, no wheezes, crackles, rhonchi. No retractions. No resp. distress. No accessory muscle use. EXTR: No c/c/e NEURO Normal gait.  PSYCH: Normally interactive. Conversant. Not depressed or anxious appearing.  Calm demeanor.    Assessment and Plan: Sinus pressure - Plan: cefdinir (OMNICEF) 300 MG capsule  Chest congestion - Plan: cefdinir (OMNICEF) 300 MG capsule  Nasal  congestion  Gave her samples of xyzal and allegra for her to try She feels like her URI sx are actually better today  than over the last several days . I gave her an rx for omnicef to hold and use if not continuing to improve She plans to see ENT at her convenience- declines a referral today however  Signed Lamar Blinks, MD

## 2017-08-31 NOTE — Patient Instructions (Signed)
I would suggest that you see ENT at your convenience to look at your nose - I often use [facility] Let ms know if you need a referral I am going to give you an rx for an antibiotic to hold- use it if your nasal/ chest symptoms continue.  However, if you continue to improve on your own you do not need to take it. Let us know if any questions or concerns, or if you are getting worse in any way

## 2017-09-05 DIAGNOSIS — J342 Deviated nasal septum: Secondary | ICD-10-CM | POA: Diagnosis not present

## 2017-09-05 DIAGNOSIS — J329 Chronic sinusitis, unspecified: Secondary | ICD-10-CM | POA: Diagnosis not present

## 2017-09-05 DIAGNOSIS — G43109 Migraine with aura, not intractable, without status migrainosus: Secondary | ICD-10-CM | POA: Diagnosis not present

## 2017-11-13 ENCOUNTER — Telehealth: Payer: Self-pay | Admitting: Family Medicine

## 2017-11-13 NOTE — Telephone Encounter (Unsigned)
Copied from [place] [phone]. Topic: Appointment Scheduling - Scheduling Inquiry for Clinic >> Nov 13, 2017 10:59 AM Neva Seat wrote: Pt has an appt w/ Dr. Charlett Blake on 2-12 with swollen elbow pain.  If pt can be worked in before the date would be great.

## 2017-11-28 ENCOUNTER — Ambulatory Visit (INDEPENDENT_AMBULATORY_CARE_PROVIDER_SITE_OTHER): Payer: Medicare HMO | Admitting: Family Medicine

## 2017-11-28 ENCOUNTER — Encounter: Payer: Self-pay | Admitting: Family Medicine

## 2017-11-28 DIAGNOSIS — M7701 Medial epicondylitis, right elbow: Secondary | ICD-10-CM

## 2017-11-28 DIAGNOSIS — E785 Hyperlipidemia, unspecified: Secondary | ICD-10-CM | POA: Diagnosis not present

## 2017-11-28 DIAGNOSIS — G43809 Other migraine, not intractable, without status migrainosus: Secondary | ICD-10-CM

## 2017-11-28 MED ORDER — ATORVASTATIN CALCIUM 40 MG PO TABS: 40.0000 mg | ORAL_TABLET | Freq: Every day | ORAL | 11 refills | Status: DC

## 2017-11-28 MED ORDER — METHYLPREDNISOLONE 4 MG PO TABS: ORAL_TABLET | ORAL | 0 refills | Status: DC

## 2017-11-28 MED ORDER — SUMATRIPTAN SUCCINATE 100 MG PO TABS: 100.0000 mg | ORAL_TABLET | ORAL | 11 refills | Status: DC

## 2017-11-28 NOTE — Patient Instructions (Addendum)
Lidocaine gel by Aspercreme, Icy Hot, Salon Pas  Ice twice daily Advil 200 mg tabs, 2 tabs twice daily x 7 days if no response then try the Medrol call for sports med referral Tennis Elbow Tennis elbow is puffiness (inflammation) of the outer tendons of your forearm close to your elbow. Your tendons attach your muscles to your bones. Tennis elbow can happen in any sport or job in which you use your elbow too much. It is caused by doing the same motion over and over. Tennis elbow can cause:  Pain and tenderness in your forearm and the outer part of your elbow.  A burning feeling. This runs from your elbow through your arm.  Weak grip in your hands.  Follow these instructions at home: Activity  Rest your elbow and wrist as told by your doctor. Try to avoid any activities that caused the problem until your doctor says that you can do them again.  If a physical therapist teaches you exercises, do all of them as told.  If you lift an object, lift it with your palm facing up. This is easier on your elbow. Lifestyle  If your tennis elbow is caused by sports, check your equipment and make sure that: ? You are using it correctly. ? It fits you well.  If your tennis elbow is caused by work, take breaks often, if you are able. Talk with your manager about doing your work in a way that is safe for you. ? If your tennis elbow is caused by computer use, talk with your manager about any changes that can be made to your work setup. General instructions  If told, apply ice to the painful area: ? Put ice in a plastic bag. ? Place a towel between your skin and the bag. ? Leave the ice on for 20 minutes, 2-3 times per day.  Take medicines only as told by your doctor.  If you were given a brace, wear it as told by your doctor.  Keep all follow-up visits as told by your doctor. This is important. Contact a doctor if:  Your pain does not get better with treatment.  Your pain gets worse.  You  have weakness in your forearm, hand, or fingers.  You cannot feel your forearm, hand, or fingers. This information is not intended to replace advice given to you by your health care provider. Make sure you discuss any questions you have with your health care provider. Document Released: 03/23/2010 Document Revised: 06/02/2016 Document Reviewed: 09/29/2014 Elsevier Interactive Patient Education  [employer].

## 2017-11-29 DIAGNOSIS — M7701 Medial epicondylitis, right elbow: Secondary | ICD-10-CM | POA: Insufficient documentation

## 2017-11-29 NOTE — Progress Notes (Signed)
Patient ID: Donna Guerra, female   DOB: [birth year], 67 y.o.   MRN: [id number]   Subjective:    Patient ID: Donna Guerra, female    DOB: 10-08-1951, 67 y.o.   MRN: [id number]  Chief Complaint  Patient presents with  . Elbow Pain    3 months, using ace wrap--helping, taking advil but does not want to continue, no known injury    HPI Patient is in today for evaluation of elbow pain. She denies any injury or fall. No redness or warmth, no swelling. She notes temporary relief from NSAID use. Notes it is worse with rotaion and heavy lifting. Denies CP/palp/SOB/HA/congestion/fevers/GI or GU c/o. Taking meds as prescribed. Her headaches are improved but she does request a refill on her Imitrex. Denies CP/palp/SOB/HA/congestion/fevers/GI or GU c/o. Taking meds as prescribed  Past Medical History:  Diagnosis Date  . Back pain 04/11/2017  . CIN I (cervical intraepithelial neoplasia I) 2005  . Colon polyps 07/08/2012  . Family history of breast cancer   . Family history of colon cancer   . Family history of melanoma   . Family history of prostate cancer   . Hyperglycemia 06/13/2017  . Hyperlipidemia 08/29/2012  . Migraines   . Osteopenia 01/2016   T score -1.7 FRAX 8.5%/1.1%  . Right shoulder pain 07/08/2012  . Skin cancer 07/08/2012   Basal cell    Past Surgical History:  Procedure Laterality Date  . ABDOMINAL SURGERY     Tummy tuck  . [place]  . CHOLECYSTECTOMY    . COLPOSCOPY    . COSMETIC SURGERY    . GYNECOLOGIC CRYOSURGERY    . OOPHORECTOMY  2002   BSO  . SHOULDER SURGERY     right rotator cuff repair  . TONSILLECTOMY AND ADENOIDECTOMY     age 65  . TUBAL LIGATION    . VAGINAL HYSTERECTOMY  2002   LAVH BSO    Family History  Problem Relation Age of Onset  . Hypertension Mother   . Breast cancer Mother 48  . Colon cancer Mother 33  . Breast cancer Maternal Aunt 80  . Breast cancer Maternal Aunt        50's  . Migraines Father   . Migraines  Sister   . Cancer Sister        Melanoma  . Diabetes Maternal Grandmother   . Breast cancer Paternal Aunt        dx over 19  . Breast cancer Sister 61  . Cirrhosis Sister   . Breast cancer Other 95       DCIS - niece  . Prostate cancer Cousin        maternal first cousin  . Colon cancer Cousin        maternal first cousin  . Cancer Cousin        unknown cancer  . Breast cancer Cousin 30       TN breast cancer - maternal first cousin    Social History   Socioeconomic History  . Marital status: Married    Spouse name: Not on file  . Number of children: Not on file  . Years of education: Not on file  . Highest education level: Not on file  Social Needs  . Financial resource strain: Not on file  . Food insecurity - worry: Not on file  . Food insecurity - inability: Not on file  . Transportation needs - medical: Not on file  .  Transportation needs - non-medical: Not on file  Occupational History  . Not on file  Tobacco Use  . Smoking status: Never Smoker  . Smokeless tobacco: Never Used  Substance and Sexual Activity  . Alcohol use: Yes    Alcohol/week: 0.0 oz    Comment: occasionally  . Drug use: No  . Sexual activity: Yes    Partners: Male    Birth control/protection: Surgical    Comment: HYST-1st intercourse 67 yo-Fewer than 5 partners  Other Topics Concern  . Not on file  Social History Narrative  . Not on file    Outpatient Medications Prior to Visit  Medication Sig Dispense Refill  . Cholecalciferol (VITAMIN D PO) Take 2,000 Units by mouth.    . Cyanocobalamin (VITAMIN B-12 PO) Take by mouth daily. Take 2500 daily    . estradiol (ESTRACE) 1 MG tablet Take 1 tablet (1 mg total) by mouth daily. 30 tablet 12  . fish oil-omega-3 fatty acids 1000 MG capsule Take 1 g by mouth daily.    . fluticasone (FLONASE) 50 MCG/ACT nasal spray Place 2 sprays into both nostrils daily. 16 g 1  . atorvastatin (LIPITOR) 40 MG tablet Take 1 tablet (40 mg total) by mouth daily.  30 tablet 11  . cefdinir (OMNICEF) 300 MG capsule Take 1 capsule (300 mg total) 2 (two) times daily by mouth. 20 capsule 0  . SUMAtriptan (IMITREX) 100 MG tablet Take 1 tablet (100 mg total) by mouth as directed. 9 tablet 11  . NONFORMULARY OR COMPOUNDED ITEM Estradiol vaginal 0.02% cream insert twice weekly vaginally (Patient not taking: Reported on 11/28/2017) 90 each 3   No facility-administered medications prior to visit.     No Known Allergies  ROS     Objective:    Physical Exam  BP 108/66 (BP Location: Left Arm, Patient Position: Sitting, Cuff Size: Normal)   Pulse 72   Resp 16   Ht 5\' 3"  (1.6 m)   Wt 124 lb 6.4 oz (56.4 kg)   SpO2 100%   BMI 22.04 kg/m  Wt Readings from Last 3 Encounters:  11/28/17 124 lb 6.4 oz (56.4 kg)  08/31/17 123 lb 9.6 oz (56.1 kg)  06/13/17 122 lb 9.6 oz (55.6 kg)     Lab Results  Component Value Date   WBC 8.6 06/13/2017   HGB 13.9 06/13/2017   HCT 40.5 06/13/2017   PLT 275.0 06/13/2017   GLUCOSE 81 06/13/2017   CHOL 177 06/13/2017   TRIG 105.0 06/13/2017   HDL 56.50 06/13/2017   LDLCALC 99 06/13/2017   ALT 26 06/13/2017   AST 24 06/13/2017   NA 137 06/13/2017   K 4.2 06/13/2017   CL 100 06/13/2017   CREATININE 0.76 06/13/2017   BUN 16 06/13/2017   CO2 30 06/13/2017   TSH 1.65 06/13/2017   HGBA1C 5.7 06/13/2017    Lab Results  Component Value Date   TSH 1.65 06/13/2017   Lab Results  Component Value Date   WBC 8.6 06/13/2017   HGB 13.9 06/13/2017   HCT 40.5 06/13/2017   MCV 98.4 06/13/2017   PLT 275.0 06/13/2017   Lab Results  Component Value Date   NA 137 06/13/2017   K 4.2 06/13/2017   CO2 30 06/13/2017   GLUCOSE 81 06/13/2017   BUN 16 06/13/2017   CREATININE 0.76 06/13/2017   BILITOT 1.0 06/13/2017   ALKPHOS 70 06/13/2017   AST 24 06/13/2017   ALT 26 06/13/2017   PROT 7.0 06/13/2017  ALBUMIN 4.4 06/13/2017   CALCIUM 10.1 06/13/2017   GFR 80.40 06/13/2017   Lab Results  Component Value Date    CHOL 177 06/13/2017   Lab Results  Component Value Date   HDL 56.50 06/13/2017   Lab Results  Component Value Date   LDLCALC 99 06/13/2017   Lab Results  Component Value Date   TRIG 105.0 06/13/2017   Lab Results  Component Value Date   CHOLHDL 3 06/13/2017   Lab Results  Component Value Date   HGBA1C 5.7 06/13/2017       Assessment & Plan:   Problem List Items Addressed This Visit    Migraines    Encouraged increased hydration, 64 ounces of clear fluids daily. Minimize alcohol and caffeine. Eat small frequent meals with lean proteins and complex carbs. Avoid high and low blood sugars. Get adequate sleep, 7-8 hours a night. Needs exercise daily preferably in the morning. Given refill on Imitrex      Relevant Medications   SUMAtriptan (IMITREX) 100 MG tablet   atorvastatin (LIPITOR) 40 MG tablet   Hyperlipidemia   Relevant Medications   atorvastatin (LIPITOR) 40 MG tablet   Epicondylitis elbow, medial, right    Pain for a couple of months without any relief from conservative management. Encouraged ice and lidocaine. Given a medrol dosepak and then may return to NSAIDs and if pain does not remit she will call for an appointment to sports medicine.      Relevant Medications   methylPREDNISolone (MEDROL) 4 MG tablet      I have discontinued Khaliah B. Loughney's cefdinir. I am also having her start on methylPREDNISolone. Additionally, I am having her maintain her fish oil-omega-3 fatty acids, Cholecalciferol (VITAMIN D PO), Cyanocobalamin (VITAMIN B-12 PO), NONFORMULARY OR COMPOUNDED ITEM, estradiol, fluticasone, SUMAtriptan, and atorvastatin.  Meds ordered this encounter  Medications  . SUMAtriptan (IMITREX) 100 MG tablet    Sig: Take 1 tablet (100 mg total) by mouth as directed.    Dispense:  9 tablet    Refill:  11  . methylPREDNISolone (MEDROL) 4 MG tablet    Sig: 5 tab po qd X 1d then 4 tab po qd X 1d then 3 tab po qd X 1d then 2 tab po qd then 1 tab po qd     Dispense:  15 tablet    Refill:  0  . atorvastatin (LIPITOR) 40 MG tablet    Sig: Take 1 tablet (40 mg total) by mouth daily.    Dispense:  30 tablet    Refill:  11     Penni Homans, MD

## 2017-11-29 NOTE — Assessment & Plan Note (Signed)
Encouraged increased hydration, 64 ounces of clear fluids daily. Minimize alcohol and caffeine. Eat small frequent meals with lean proteins and complex carbs. Avoid high and low blood sugars. Get adequate sleep, 7-8 hours a night. Needs exercise daily preferably in the morning. Given refill on Imitrex

## 2017-11-29 NOTE — Assessment & Plan Note (Signed)
Pain for a couple of months without any relief from conservative management. Encouraged ice and lidocaine. Given a medrol dosepak and then may return to NSAIDs and if pain does not remit she will call for an appointment to sports medicine.

## 2017-12-12 DIAGNOSIS — R69 Illness, unspecified: Secondary | ICD-10-CM | POA: Diagnosis not present

## 2017-12-12 DIAGNOSIS — Z419 Encounter for procedure for purposes other than remedying health state, unspecified: Secondary | ICD-10-CM | POA: Diagnosis not present

## 2017-12-12 DIAGNOSIS — Z85828 Personal history of other malignant neoplasm of skin: Secondary | ICD-10-CM | POA: Diagnosis not present

## 2017-12-12 DIAGNOSIS — L821 Other seborrheic keratosis: Secondary | ICD-10-CM | POA: Diagnosis not present

## 2017-12-12 DIAGNOSIS — D2272 Melanocytic nevi of left lower limb, including hip: Secondary | ICD-10-CM | POA: Diagnosis not present

## 2017-12-12 DIAGNOSIS — D485 Neoplasm of uncertain behavior of skin: Secondary | ICD-10-CM | POA: Diagnosis not present

## 2017-12-12 DIAGNOSIS — C44519 Basal cell carcinoma of skin of other part of trunk: Secondary | ICD-10-CM | POA: Diagnosis not present

## 2017-12-12 DIAGNOSIS — L853 Xerosis cutis: Secondary | ICD-10-CM | POA: Diagnosis not present

## 2017-12-12 DIAGNOSIS — L570 Actinic keratosis: Secondary | ICD-10-CM | POA: Diagnosis not present

## 2017-12-12 DIAGNOSIS — L986 Other infiltrative disorders of the skin and subcutaneous tissue: Secondary | ICD-10-CM | POA: Diagnosis not present

## 2017-12-12 DIAGNOSIS — C44619 Basal cell carcinoma of skin of left upper limb, including shoulder: Secondary | ICD-10-CM | POA: Diagnosis not present

## 2017-12-12 DIAGNOSIS — C44529 Squamous cell carcinoma of skin of other part of trunk: Secondary | ICD-10-CM | POA: Diagnosis not present

## 2017-12-12 DIAGNOSIS — D1801 Hemangioma of skin and subcutaneous tissue: Secondary | ICD-10-CM | POA: Diagnosis not present

## 2018-01-24 ENCOUNTER — Ambulatory Visit: Payer: Medicare HMO | Admitting: Gynecology

## 2018-01-24 ENCOUNTER — Encounter: Payer: Self-pay | Admitting: Gynecology

## 2018-01-24 VITALS — BP 118/76 | Ht 64.0 in | Wt 125.0 lb

## 2018-01-24 DIAGNOSIS — N952 Postmenopausal atrophic vaginitis: Secondary | ICD-10-CM

## 2018-01-24 DIAGNOSIS — Z1272 Encounter for screening for malignant neoplasm of vagina: Secondary | ICD-10-CM

## 2018-01-24 DIAGNOSIS — M8580 Other specified disorders of bone density and structure, unspecified site: Secondary | ICD-10-CM

## 2018-01-24 DIAGNOSIS — Z01411 Encounter for gynecological examination (general) (routine) with abnormal findings: Secondary | ICD-10-CM

## 2018-01-24 DIAGNOSIS — Z79890 Hormone replacement therapy (postmenopausal): Secondary | ICD-10-CM

## 2018-01-24 LAB — RESULTS CONSOLE HPV: CHL HPV: NEGATIVE

## 2018-01-24 LAB — HM PAP SMEAR: HM Pap smear: NORMAL

## 2018-01-24 MED ORDER — ESTRADIOL 1 MG PO TABS: ORAL_TABLET | ORAL | 12 refills | Status: DC

## 2018-01-24 NOTE — Addendum Note (Signed)
Addended by: Nelva Nay on: 01/24/2018 02:33 PM   Modules accepted: Orders

## 2018-01-24 NOTE — Patient Instructions (Signed)
Followup for bone density as scheduled. 

## 2018-01-24 NOTE — Progress Notes (Signed)
    Donna Guerra [birth year] [id number]        66 y.o.  G2P2002 for annual gynecologic exam.  Also wants to talk about her hormone replacement therapy.  Had been on estradiol 0.5 mg but weaned herself off.  She does not feel as good noting hot flushes, sweats, lack of energy.  Past medical history,surgical history, problem list, medications, allergies, family history and social history were all reviewed and documented as reviewed in the EPIC chart.  ROS:  Performed with pertinent positives and negatives included in the history, assessment and plan.   Additional significant findings : None   Exam: [name] assistant Vitals:   01/24/18 1358  BP: 118/76  Weight: 125 lb (56.7 kg)  Height: 5\' 4"  (1.626 m)   Body mass index is 21.46 kg/m.  General appearance:  Normal affect, orientation and appearance. Skin: Grossly normal HEENT: Without gross lesions.  No cervical or supraclavicular adenopathy. Thyroid normal.  Lungs:  Clear without wheezing, rales or rhonchi Cardiac: RR, without RMG Abdominal:  Soft, nontender, without masses, guarding, rebound, organomegaly or hernia Breasts:  Examined lying and sitting without masses, retractions, discharge or axillary adenopathy. Pelvic:  Ext, BUS, Vagina: With atrophic changes  Adnexa: Without masses or tenderness    Anus and perineum: Normal   Rectovaginal: Normal sphincter tone without palpated masses or tenderness.    Assessment/Plan:  67 y.o. G16P2002 female for annual gynecologic exam, [name] for bleeding, pain, leiomyoma, simple hyperplasia and adenomyosis.   1. Postmenopausal/atrophic genital changes.  Weaned herself off of her estradiol.  Also using vaginal estradiol cream twice weekly doing well with this.  She does not feel as good as far as hot flushes, sweats, lack of energy.  We discussed the whole HRT issue again to include risks versus benefits.  Thrombosis such as stroke heart attack DVT in the breast  cancer issue was reviewed versus symptom relief, possible cardiovascular and bone health when started early and continued.  After lengthy discussion the patient wants to go ahead and reinitiate.  We discussed transdermal versus oral and the benefits of transdermal absorption from a thrombosis risk.  At this point since she did well on the oral she preferred to return to this.  Estradiol 1 mg one half tab daily #30 prescribed.  Refill times 1 year.  We will also refill her vaginal estradiol cream as she is doing well with that. 2. Osteopenia.  DEXA 2017 T score -1.7 FRAX 8.5% / 1.1%.  Repeat DEXA now a 2-year interval and patient agrees to schedule in follow-up for this. 3. Colonoscopy 2016.  Repeat at their recommended interval. 4. Pap smear 2016.  Pap smear done today.  Reviewed current screening guidelines.  Options to stop screening based on age and hysterectomy history discussed per current screening guidelines.  At this point she feels more comfortable continuing screening. 5. Mammography 05/2017.  Continue with annual mammography when due.  Breast exam normal today. 6. Health maintenance.  No routine lab work done as patient does this elsewhere.  Follow-up 1 year, sooner as needed.   Anastasio Auerbach MD, 2:25 PM 01/24/2018

## 2018-01-25 ENCOUNTER — Ambulatory Visit (INDEPENDENT_AMBULATORY_CARE_PROVIDER_SITE_OTHER): Payer: Medicare HMO | Admitting: Family Medicine

## 2018-01-25 ENCOUNTER — Ambulatory Visit (HOSPITAL_BASED_OUTPATIENT_CLINIC_OR_DEPARTMENT_OTHER)
Admission: RE | Admit: 2018-01-25 | Discharge: 2018-01-25 | Disposition: A | Discharge status: Home / Self Care | Payer: Medicare HMO | Source: Ambulatory Visit | Attending: Family Medicine | Admitting: Family Medicine

## 2018-01-25 ENCOUNTER — Encounter: Payer: Self-pay | Admitting: Family Medicine

## 2018-01-25 VITALS — BP 118/60 | HR 75 | Resp 16 | Ht 64.0 in | Wt 125.0 lb

## 2018-01-25 DIAGNOSIS — M79672 Pain in left foot: Secondary | ICD-10-CM

## 2018-01-25 DIAGNOSIS — S92352A Displaced fracture of fifth metatarsal bone, left foot, initial encounter for closed fracture: Secondary | ICD-10-CM | POA: Diagnosis not present

## 2018-01-25 DIAGNOSIS — S92355A Nondisplaced fracture of fifth metatarsal bone, left foot, initial encounter for closed fracture: Secondary | ICD-10-CM | POA: Diagnosis not present

## 2018-01-25 DIAGNOSIS — M25572 Pain in left ankle and joints of left foot: Secondary | ICD-10-CM

## 2018-01-25 DIAGNOSIS — X58XXXA Exposure to other specified factors, initial encounter: Secondary | ICD-10-CM | POA: Insufficient documentation

## 2018-01-25 NOTE — Patient Instructions (Signed)
Ankle Sprain An ankle sprain is a stretch or tear in one of the tough, fiber-like tissues (ligaments) in the ankle. The ligaments in your ankle help to hold the bones of the ankle together. What are the causes? This condition is often caused by stepping on or falling on the outer edge of the foot. What increases the risk? This condition is more likely to develop in people who play sports. What are the signs or symptoms? Symptoms of this condition include:  Pain in your ankle.  Swelling.  Bruising. Bruising may develop right after you sprain your ankle or 1-2 days later.  Trouble standing or walking, especially when you turn or change directions.  How is this diagnosed? This condition is diagnosed with a physical exam. During the exam, your health care provider will press on certain parts of your foot and ankle and try to move them in certain ways. X-rays may be taken to see how severe the sprain is and to check for broken bones. How is this treated? This condition may be treated with:  A brace. This is used to keep the ankle from moving until it heals.  An elastic bandage. This is used to support the ankle.  Crutches.  Pain medicine.  Surgery. This may be needed if the sprain is severe.  Physical therapy. This may help to improve the range of motion in the ankle.  Follow these instructions at home:  Rest your ankle.  Take over-the-counter and prescription medicines only as told by your health care provider.  For 2-3 days, keep your ankle raised (elevated) above the level of your heart as much as possible.  If directed, apply ice to the area: ? Put ice in a plastic bag. ? Place a towel between your skin and the bag. ? Leave the ice on for 20 minutes, 2-3 times a day.  If you were given a brace: ? Wear it as directed. ? Remove it to shower or bathe. ? Try not to move your ankle much, but wiggle your toes from time to time. This helps to prevent swelling.  If you were  given an elastic bandage (dressing): ? Remove it to shower or bathe. ? Try not to move your ankle much, but wiggle your toes from time to time. This helps to prevent swelling. ? Adjust the dressing to make it more comfortable if it feels too tight. ? Loosen the dressing if you have numbness or tingling in your foot, or if your foot becomes cold and blue.  If you have crutches, use them as told by your health care provider. Continue to use them until you can walk without feeling pain in your ankle. Contact a health care provider if:  You have rapidly increasing bruising or swelling.  Your pain is not relieved with medicine. Get help right away if:  Your toes or foot becomes numb or blue.  You have severe pain that gets worse. This information is not intended to replace advice given to you by your health care provider. Make sure you discuss any questions you have with your health care provider. Document Released: 10/03/2005 Document Revised: 11/10/2016 Document Reviewed: 05/05/2015 Elsevier Interactive Patient Education  2018 [employer]  

## 2018-01-25 NOTE — Progress Notes (Signed)
Patient ID: Donna Guerra, female    DOB: April 29, 1951  Age: 67 y.o. MRN: [id number]    Subjective:  Subjective  HPI Donna Guerra presents for pain and swelling in L ankle / foot --- she twisted it on Tuesday while weed eating on a hill.  Her foot rolled out   Review of Systems  Constitutional: Negative for appetite change, diaphoresis, fatigue and unexpected weight change.  Eyes: Negative for pain, redness and visual disturbance.  Respiratory: Negative for cough, chest tightness, shortness of breath and wheezing.   Cardiovascular: Negative for chest pain, palpitations and leg swelling.  Endocrine: Negative for cold intolerance, heat intolerance, polydipsia, polyphagia and polyuria.  Genitourinary: Negative for difficulty urinating, dysuria and frequency.  Musculoskeletal: Positive for gait problem and joint swelling.  Neurological: Negative for dizziness, light-headedness, numbness and headaches.    History Past Medical History:  Diagnosis Date  . Back pain 04/11/2017  . CIN I (cervical intraepithelial neoplasia I) 2005  . Colon polyps 07/08/2012  . Hyperglycemia 06/13/2017  . Hyperlipidemia 08/29/2012  . Migraines   . Osteopenia 01/2016   T score -1.7 FRAX 8.5%/1.1%  . Right shoulder pain 07/08/2012  . Skin cancer 07/08/2012   Basal cell    She has a past surgical history that includes Cholecystectomy; Tubal ligation; Oophorectomy (2002); Shoulder surgery; Colposcopy; Gynecologic cryosurgery; Cesarean section (1978 and 1981); Tonsillectomy and adenoidectomy; Vaginal hysterectomy (2002); Abdominal surgery; and Cosmetic surgery.   Her family history includes Breast cancer in her maternal aunt and paternal aunt; Breast cancer (age of onset: 30) in her sister; Breast cancer (age of onset: 61) in her other; Breast cancer (age of onset: 56) in her mother; Breast cancer (age of onset: 58) in her cousin; Breast cancer (age of onset: 47) in her maternal aunt; Cancer in her cousin and  sister; Cirrhosis in her sister; Colon cancer in her cousin; Colon cancer (age of onset: 62) in her mother; Diabetes in her maternal grandmother; Hypertension in her mother; Migraines in her father and sister; Prostate cancer in her cousin.She reports that she has never smoked. She has never used smokeless tobacco. She reports that she drinks alcohol. She reports that she does not use drugs.  Current Outpatient Medications on File Prior to Visit  Medication Sig Dispense Refill  . atorvastatin (LIPITOR) 40 MG tablet Take 1 tablet (40 mg total) by mouth daily. 30 tablet 11  . Cholecalciferol (VITAMIN D PO) Take 2,000 Units by mouth.    . Cyanocobalamin (VITAMIN B-12 PO) Take by mouth daily. Take 2500 daily    . estradiol (ESTRACE) 1 MG tablet 1/2 tab daily 30 tablet 12  . fish oil-omega-3 fatty acids 1000 MG capsule Take 1 g by mouth daily.    . fluticasone (FLONASE) 50 MCG/ACT nasal spray Place 2 sprays into both nostrils daily. 16 g 1  . NONFORMULARY OR COMPOUNDED ITEM Estradiol vaginal 0.02% cream insert twice weekly vaginally 90 each 3  . SUMAtriptan (IMITREX) 100 MG tablet Take 1 tablet (100 mg total) by mouth as directed. 9 tablet 11   No current facility-administered medications on file prior to visit.      Objective:  Objective  Physical Exam  Musculoskeletal:       Left ankle: She exhibits swelling. Tenderness. Lateral malleolus tenderness found.       Left foot: There is decreased range of motion, tenderness and bony tenderness.       Feet:  Nursing note and vitals reviewed.  BP 118/60 (BP  Location: Left Arm, Patient Position: Sitting, Cuff Size: Normal)   Pulse 75   Resp 16   Ht 5\' 4"  (1.626 m)   Wt 125 lb (56.7 kg)   SpO2 95%   BMI 21.46 kg/m  Wt Readings from Last 3 Encounters:  01/25/18 125 lb (56.7 kg)  01/24/18 125 lb (56.7 kg)  11/28/17 124 lb 6.4 oz (56.4 kg)     Lab Results  Component Value Date   WBC 8.6 06/13/2017   HGB 13.9 06/13/2017   HCT 40.5  06/13/2017   PLT 275.0 06/13/2017   GLUCOSE 81 06/13/2017   CHOL 177 06/13/2017   TRIG 105.0 06/13/2017   HDL 56.50 06/13/2017   LDLCALC 99 06/13/2017   ALT 26 06/13/2017   AST 24 06/13/2017   NA 137 06/13/2017   K 4.2 06/13/2017   CL 100 06/13/2017   CREATININE 0.76 06/13/2017   BUN 16 06/13/2017   CO2 30 06/13/2017   TSH 1.65 06/13/2017   HGBA1C 5.7 06/13/2017    Mm Diag Breast Tomo Uni Right  Result Date: 06/06/2017 CLINICAL DATA:  Recall from screening mammography with tomosynthesis, possible distortion in the inner right breast visualized only on the CC image. EXAM: 2D DIGITAL DIAGNOSTIC UNILATERAL RIGHT MAMMOGRAM WITH CAD AND ADJUNCT TOMO COMPARISON:  05/31/2017, 05/18/2016 and earlier. ACR Breast Density Category c: The breast tissue is heterogeneously dense, which may obscure small masses. FINDINGS: Standard and tomosynthesis spot-compression CC view of the area of concern in the right breast and a standard 2D and tomosynthesis full field mediolateral view of the right breast were obtained. No persistent architectural distortion or mass in the area of concern in the inner right breast on the spot compression tomosynthesis images. No findings suspicious for malignancy in the right breast. The full field mediolateral image was processed with CAD. IMPRESSION: No mammographic evidence of malignancy involving the right breast. RECOMMENDATION: Screening mammogram in one year.(Code:SM-B-01Y) I have discussed the findings and recommendations with the patient. Results were also provided in writing at the conclusion of the visit. If applicable, a reminder letter will be sent to the patient regarding the next appointment. BI-RADS CATEGORY  1: Negative. Electronically Signed   By: Evangeline Dakin M.D.   On: 06/06/2017 13:52     Assessment & Plan:  Plan  I am having Donna Guerra maintain her fish oil-omega-3 fatty acids, Cholecalciferol (VITAMIN D PO), Cyanocobalamin (VITAMIN B-12 PO),  NONFORMULARY OR COMPOUNDED ITEM, fluticasone, SUMAtriptan, atorvastatin, and estradiol.  No orders of the defined types were placed in this encounter.   Problem List Items Addressed This Visit    None    Visit Diagnoses    Left foot pain    -  Primary   Relevant Orders   DG Foot Complete Left   DG Ankle Complete Left   Acute left ankle pain       Relevant Orders   DG Foot Complete Left   DG Ankle Complete Left    keep elevated Ace, post op boot  Ice  Rest  Xray   Follow-up: Return if symptoms worsen or fail to improve.  Ann Held, DO

## 2018-01-26 LAB — PAP IG W/ RFLX HPV ASCU

## 2018-01-30 ENCOUNTER — Telehealth: Payer: Self-pay | Admitting: *Deleted

## 2018-01-30 MED ORDER — NONFORMULARY OR COMPOUNDED ITEM: 3 refills | Status: DC

## 2018-01-30 NOTE — Telephone Encounter (Signed)
Pt called stating [facility] never received estradiol vaginal cream Rx. Per 01/24/18 note "We will also refill her vaginal estradiol cream as she is doing well with that."   This was never done, Rx called in today

## 2018-04-02 ENCOUNTER — Ambulatory Visit (INDEPENDENT_AMBULATORY_CARE_PROVIDER_SITE_OTHER): Payer: Medicare HMO

## 2018-04-02 DIAGNOSIS — M8589 Other specified disorders of bone density and structure, multiple sites: Secondary | ICD-10-CM

## 2018-04-02 DIAGNOSIS — M8580 Other specified disorders of bone density and structure, unspecified site: Secondary | ICD-10-CM

## 2018-04-02 DIAGNOSIS — Z780 Asymptomatic menopausal state: Secondary | ICD-10-CM

## 2018-04-03 ENCOUNTER — Encounter: Payer: Self-pay | Admitting: Gynecology

## 2018-04-03 ENCOUNTER — Other Ambulatory Visit: Payer: Self-pay | Admitting: Gynecology

## 2018-04-03 DIAGNOSIS — Z780 Asymptomatic menopausal state: Secondary | ICD-10-CM

## 2018-04-03 DIAGNOSIS — M8589 Other specified disorders of bone density and structure, multiple sites: Secondary | ICD-10-CM

## 2018-05-04 ENCOUNTER — Other Ambulatory Visit: Payer: Self-pay | Admitting: Gynecology

## 2018-05-04 DIAGNOSIS — Z1231 Encounter for screening mammogram for malignant neoplasm of breast: Secondary | ICD-10-CM

## 2018-06-06 ENCOUNTER — Ambulatory Visit
Admission: RE | Admit: 2018-06-06 | Discharge: 2018-06-06 | Disposition: A | Discharge status: Home / Self Care | Payer: Medicare HMO | Source: Ambulatory Visit | Attending: Gynecology | Admitting: Gynecology

## 2018-06-06 DIAGNOSIS — Z1231 Encounter for screening mammogram for malignant neoplasm of breast: Secondary | ICD-10-CM

## 2018-06-13 NOTE — Progress Notes (Addendum)
Subjective:   Donna Guerra is a 67 y.o. female who presents for Medicare Annual (Subsequent) preventive examination.  Review of Systems: No ROS.  Medicare Wellness Visit. Additional risk factors are reflected in the social history. Cardiac Risk Factors include: advanced age (>76men, >32 women);dyslipidemia Sleep patterns: No issues. Good 8 hrs.  Home Safety/Smoke Alarms: Feels safe in home. Smoke alarms in place. Lives in 1 story home with husband.   Female:   Pap-utd       Mammo-utd       Dexa scan- utd       CCS- due 04/2020. Last 04/24/15      Objective:     Vitals: BP 98/62 (BP Location: Left Arm, Patient Position: Sitting, Cuff Size: Normal)   Pulse 70   Ht 5' 3.5" (1.613 m)   Wt 124 lb (56.2 kg)   SpO2 98%   BMI 21.62 kg/m   Body mass index is 21.62 kg/m.  Advanced Directives 06/14/2018 06/13/2017  Does Patient Have a Medical Advance Directive? Yes No  Type of Paramedic of [place];Living will -  Copy of [place] in Chart? No - copy requested -  Would patient like information on creating a medical advance directive? - Yes (MAU/Ambulatory/Procedural Areas - Information given)    Tobacco Social History   Tobacco Use  Smoking Status Never Smoker  Smokeless Tobacco Never Used     Counseling given: Not Answered   Clinical Intake: Pain : No/denies pain    Past Medical History:  Diagnosis Date  . Back pain 04/11/2017  . CIN I (cervical intraepithelial neoplasia I) 2005  . Colon polyps 07/08/2012  . Hyperglycemia 06/13/2017  . Hyperlipidemia 08/29/2012  . Migraines   . Osteopenia 03/2018   T score -1.6 FRAX 11% / 2.9% stable from prior DEXA  . Right shoulder pain 07/08/2012  . Skin cancer 07/08/2012   Basal cell   Past Surgical History:  Procedure Laterality Date  . ABDOMINAL SURGERY     Tummy tuck  . [place]  . CHOLECYSTECTOMY    . COLPOSCOPY    . COSMETIC SURGERY    .  GYNECOLOGIC CRYOSURGERY    . OOPHORECTOMY  2002   BSO  . SHOULDER SURGERY     right rotator cuff repair  . TONSILLECTOMY AND ADENOIDECTOMY     age 43  . TUBAL LIGATION    . VAGINAL HYSTERECTOMY  2002   LAVH BSO   Family History  Problem Relation Age of Onset  . Hypertension Mother   . Breast cancer Mother 49  . Colon cancer Mother 1  . Breast cancer Maternal Aunt 80  . Breast cancer Maternal Aunt        50's  . Migraines Father   . Migraines Sister   . Cancer Sister        Melanoma  . Diabetes Maternal Grandmother   . Breast cancer Paternal Aunt        dx over 89  . Breast cancer Sister 72  . Cirrhosis Sister   . Breast cancer Other 54       DCIS - niece  . Prostate cancer Cousin        maternal first cousin  . Colon cancer Cousin        maternal first cousin  . Cancer Cousin        unknown cancer  . Breast cancer Cousin 55  [place] breast cancer - maternal first cousin   Social History   Socioeconomic History  . Marital status: Married    Spouse name: Not on file  . Number of children: Not on file  . Years of education: Not on file  . Highest education level: Not on file  Occupational History  . Not on file  Social Needs  . Financial resource strain: Not on file  . Food insecurity:    Worry: Not on file    Inability: Not on file  . Transportation needs:    Medical: Not on file    Non-medical: Not on file  Tobacco Use  . Smoking status: Never Smoker  . Smokeless tobacco: Never Used  Substance and Sexual Activity  . Alcohol use: Yes    Alcohol/week: 0.0 standard drinks    Comment: occasionally  . Drug use: No  . Sexual activity: Yes    Partners: Male    Birth control/protection: Surgical    Comment: HYST-1st intercourse 67 yo-Fewer than 5 partners  Lifestyle  . Physical activity:    Days per week: Not on file    Minutes per session: Not on file  . Stress: Not on file  Relationships  . Social connections:    Talks on phone: Not on file     Gets together: Not on file    Attends religious service: Not on file    Active member of club or organization: Not on file    Attends meetings of clubs or organizations: Not on file    Relationship status: Not on file  Other Topics Concern  . Not on file  Social History Narrative  . Not on file    Outpatient Encounter Medications as of 06/14/2018  Medication Sig  . Cholecalciferol (VITAMIN D PO) Take 2,000 Units by mouth.  . Cyanocobalamin (VITAMIN B-12 PO) Take by mouth daily. Take 2500 daily  . estradiol (ESTRACE) 1 MG tablet 1/2 tab daily  . fish oil-omega-3 fatty acids 1000 MG capsule Take 1 g by mouth daily.  . NONFORMULARY OR COMPOUNDED ITEM Estradiol vaginal 0.02% cream insert twice weekly vaginally  . SUMAtriptan (IMITREX) 100 MG tablet Take 1 tablet (100 mg total) by mouth as directed.  . [DISCONTINUED] atorvastatin (LIPITOR) 40 MG tablet Take 1 tablet (40 mg total) by mouth daily.  . [DISCONTINUED] fluticasone (FLONASE) 50 MCG/ACT nasal spray Place 2 sprays into both nostrils daily.   No facility-administered encounter medications on file as of 06/14/2018.     Activities of Daily Living In your present state of health, do you have any difficulty performing the following activities: 06/14/2018  Hearing? N  Vision? N  Difficulty concentrating or making decisions? N  Walking or climbing stairs? N  Dressing or bathing? N  Doing errands, shopping? N  Preparing Food and eating ? N  Using the Toilet? N  In the past six months, have you accidently leaked urine? N  Do you have problems with loss of bowel control? N  Managing your Medications? N  Managing your Finances? N  Some recent data might be hidden    Patient Care Team: Mosie Lukes, MD as PCP - General (Family Medicine) Fontaine, Belinda Block, MD as Consulting Physician (Gynecology) Arta Silence, MD as Consulting Physician (Gastroenterology) Allyn Kenner, MD (Dermatology) Manning Charity, MD (Plastic Surgery)      Assessment:   This is a routine wellness examination for Donna Guerra. Physical assessment deferred to PCP.  Exercise Activities and Dietary recommendations Current Exercise Habits:  Home exercise routine, Type of exercise: strength training/weights;walking, Time (Minutes): 30, Frequency (Times/Week): 7, Weekly Exercise (Minutes/Week): 210, Exercise limited by: None identified Diet (meal preparation, eat out, water intake, caffeinated beverages, dairy products, fruits and vegetables): well balanced   Goals    . Maintain current health       Fall Risk Fall Risk  06/14/2018 06/14/2018 12/29/2014  Falls in the past year? Yes Yes No  Number falls in past yr: 1 1 -  Injury with Fall? Yes Yes -  Follow up Education provided;Falls prevention discussed - -   Depression Screen PHQ 2/9 Scores 06/14/2018 06/14/2018 06/13/2017 06/13/2017  PHQ - 2 Score 0 0 0 0     Cognitive Function Ad8 score reviewed for issues:  Issues making decisions:no  Less interest in hobbies / activities:no  Repeats questions, stories (family complaining):no  Trouble using ordinary gadgets (microwave, computer, phone):no  Forgets the month or year: no  Mismanaging finances: no  Remembering appts:no  Daily problems with thinking and/or memory:no Ad8 score is=0        Immunization History  Administered Date(s) Administered  . Influenza Whole 07/18/2011, 08/01/2012, 06/28/2013  . Zoster 08/07/2012    Screening Tests Health Maintenance  Topic Date Due  . Hepatitis C Screening  26-Jun-1951  . PNA vac Low Risk Adult (1 of 2 - PCV13) 04/12/2014  . INFLUENZA VACCINE  01/15/2019 (Originally 05/17/2018)  . PAP SMEAR  01/25/2019  . MAMMOGRAM  06/06/2020  . TETANUS/TDAP  11/03/2020  . COLONOSCOPY  05/21/2025  . DEXA SCAN  Completed       Plan:    Please schedule your next medicare wellness visit with me in 1 yr.  Continue to eat heart healthy diet (full of fruits, vegetables, whole grains, lean protein,  water--limit salt, fat, and sugar intake) and increase physical activity as tolerated.  Bring a copy of your living will and/or healthcare power of attorney to your next office visit.  Pt declines PCV 13 today.  I have personally reviewed and noted the following in the patient's chart:   . Medical and social history . Use of alcohol, tobacco or illicit drugs  . Current medications and supplements . Functional ability and status . Nutritional status . Physical activity . Advanced directives . List of other physicians . Hospitalizations, surgeries, and ER visits in previous 12 months . Vitals . Screenings to include cognitive, depression, and falls . Referrals and appointments  In addition, I have reviewed and discussed with patient certain preventive protocols, quality metrics, and best practice recommendations. A written personalized care plan for preventive services as well as general preventive health recommendations were provided to patient.     Shela Nevin, [place]  06/14/2018   Medical screening examination/treatment was performed by qualified clinical staff member and as supervising physician I was immediately available for consultation/collaboration. I have reviewed documentation and agree with assessment and plan.  Penni Homans, MD

## 2018-06-14 ENCOUNTER — Ambulatory Visit (INDEPENDENT_AMBULATORY_CARE_PROVIDER_SITE_OTHER): Payer: Medicare HMO | Admitting: *Deleted

## 2018-06-14 ENCOUNTER — Encounter: Payer: Self-pay | Admitting: *Deleted

## 2018-06-14 ENCOUNTER — Encounter: Payer: Self-pay | Admitting: Family Medicine

## 2018-06-14 ENCOUNTER — Ambulatory Visit (INDEPENDENT_AMBULATORY_CARE_PROVIDER_SITE_OTHER): Payer: Medicare HMO | Admitting: Family Medicine

## 2018-06-14 ENCOUNTER — Other Ambulatory Visit (INDEPENDENT_AMBULATORY_CARE_PROVIDER_SITE_OTHER): Payer: Medicare HMO

## 2018-06-14 VITALS — BP 98/62 | HR 70 | Ht 63.5 in | Wt 124.0 lb

## 2018-06-14 VITALS — BP 98/62 | HR 70 | Temp 98.1°F | Resp 18 | Ht 63.5 in | Wt 124.6 lb

## 2018-06-14 DIAGNOSIS — R739 Hyperglycemia, unspecified: Secondary | ICD-10-CM

## 2018-06-14 DIAGNOSIS — G43809 Other migraine, not intractable, without status migrainosus: Secondary | ICD-10-CM

## 2018-06-14 DIAGNOSIS — E785 Hyperlipidemia, unspecified: Secondary | ICD-10-CM

## 2018-06-14 DIAGNOSIS — Z7289 Other problems related to lifestyle: Secondary | ICD-10-CM

## 2018-06-14 DIAGNOSIS — Z0000 Encounter for general adult medical examination without abnormal findings: Secondary | ICD-10-CM

## 2018-06-14 DIAGNOSIS — Z800 Family history of malignant neoplasm of digestive organs: Secondary | ICD-10-CM | POA: Diagnosis not present

## 2018-06-14 LAB — COMPREHENSIVE METABOLIC PANEL
ALBUMIN: 4.6 g/dL (ref 3.5–5.2)
ALK PHOS: 89 U/L (ref 39–117)
ALT: 34 U/L (ref 0–35)
AST: 24 U/L (ref 0–37)
BUN: 18 mg/dL (ref 6–23)
CO2: 31 mEq/L (ref 19–32)
Calcium: 10.5 mg/dL (ref 8.4–10.5)
Chloride: 101 mEq/L (ref 96–112)
Creatinine, Ser: 0.82 mg/dL (ref 0.40–1.20)
GFR: 73.43 mL/min (ref >60.00)
GLUCOSE: 109 mg/dL — AB (ref 70–99)
Potassium: 4.5 mEq/L (ref 3.5–5.1)
SODIUM: 139 meq/L (ref 135–145)
TOTAL PROTEIN: 7.3 g/dL (ref 6.0–8.3)
Total Bilirubin: 1.2 mg/dL (ref 0.2–1.2)

## 2018-06-14 LAB — CBC
HCT: 42.6 % (ref 36.0–46.0)
Hemoglobin: 14.7 g/dL (ref 12.0–15.0)
MCHC: 34.4 g/dL (ref 30.0–36.0)
MCV: 96.5 fl (ref 78.0–100.0)
Platelets: 294 10*3/uL (ref 150.0–400.0)
RBC: 4.42 Mil/uL (ref 3.87–5.11)
RDW: 13 % (ref 11.5–15.5)
WBC: 7.1 10*3/uL (ref 4.0–10.5)

## 2018-06-14 LAB — TSH: TSH: 2.28 u[IU]/mL (ref 0.35–4.50)

## 2018-06-14 LAB — HEMOGLOBIN A1C
Hgb A1c MFr Bld: 5.7 % (ref 4.6–6.5)
Hgb A1c MFr Bld: 5.7 % (ref 4.6–6.5)

## 2018-06-14 LAB — LIPID PANEL
CHOL/HDL RATIO: 3
Cholesterol: 162 mg/dL (ref 0–200)
HDL: 58.6 mg/dL (ref >39.00)
LDL CALC: 82 mg/dL (ref 0–99)
NONHDL: 103.51
Triglycerides: 108 mg/dL (ref 0.0–149.0)
VLDL: 21.6 mg/dL (ref 0.0–40.0)

## 2018-06-14 MED ORDER — ATORVASTATIN CALCIUM 40 MG PO TABS: 40.0000 mg | ORAL_TABLET | Freq: Every day | ORAL | 11 refills | Status: DC

## 2018-06-14 NOTE — Patient Instructions (Signed)
Please schedule your next medicare wellness visit with me in 1 yr.  Continue to eat heart healthy diet (full of fruits, vegetables, whole grains, lean protein, water--limit salt, fat, and sugar intake) and increase physical activity as tolerated.  Bring a copy of your living will and/or healthcare power of attorney to your next office visit.   Donna Guerra , Thank you for taking time to come for your Medicare Wellness Visit. I appreciate your ongoing commitment to your health goals. Please review the following plan we discussed and let me know if I can assist you in the future.   These are the goals we discussed: Goals    . Maintain current health       This is a list of the screening recommended for you and due dates:  Health Maintenance  Topic Date Due  .  Hepatitis C: One time screening is recommended by Center for Disease Control  (CDC) for  adults born from [birth year] through 1967.   1950/11/24  . Flu Shot  01/15/2019*  . Pneumonia vaccines (1 of 2 - PCV13) 06/15/2019*  . Pap Smear  01/25/2019  . Mammogram  06/06/2020  . Tetanus Vaccine  11/03/2020  . Colon Cancer Screening  05/21/2025  . DEXA scan (bone density measurement)  Completed  *Topic was postponed. The date shown is not the original due date.    Health Maintenance for Postmenopausal Women Menopause is a normal process in which your reproductive ability comes to an end. This process happens gradually over a span of months to years, usually between the ages of 39 and 42. Menopause is complete when you have missed 12 consecutive menstrual periods. It is important to talk with your health care provider about some of the most common conditions that affect postmenopausal women, such as heart disease, cancer, and bone loss (osteoporosis). Adopting a healthy lifestyle and getting preventive care can help to promote your health and wellness. Those actions can also lower your chances of developing some of these common conditions. What  should I know about menopause? During menopause, you may experience a number of symptoms, such as:  Moderate-to-severe hot flashes.  Night sweats.  Decrease in sex drive.  Mood swings.  Headaches.  Tiredness.  Irritability.  Memory problems.  Insomnia.  Choosing to treat or not to treat menopausal changes is an individual decision that you make with your health care provider. What should I know about hormone replacement therapy and supplements? Hormone therapy products are effective for treating symptoms that are associated with menopause, such as hot flashes and night sweats. Hormone replacement carries certain risks, especially as you become older. If you are thinking about using estrogen or estrogen with progestin treatments, discuss the benefits and risks with your health care provider. What should I know about heart disease and stroke? Heart disease, heart attack, and stroke become more likely as you age. This may be due, in part, to the hormonal changes that your body experiences during menopause. These can affect how your body processes dietary fats, triglycerides, and cholesterol. Heart attack and stroke are both medical emergencies. There are many things that you can do to help prevent heart disease and stroke:  Have your blood pressure checked at least every 1-2 years. High blood pressure causes heart disease and increases the risk of stroke.  If you are 39-14 years old, ask your health care provider if you should take aspirin to prevent a heart attack or a stroke.  Do not use any tobacco  products, including cigarettes, chewing tobacco, or electronic cigarettes. If you need help quitting, ask your health care provider.  It is important to eat a healthy diet and maintain a healthy weight. ? Be sure to include plenty of vegetables, fruits, low-fat dairy products, and lean protein. ? Avoid eating foods that are high in solid fats, added sugars, or salt (sodium).  Get  regular exercise. This is one of the most important things that you can do for your health. ? Try to exercise for at least 150 minutes each week. The type of exercise that you do should increase your heart rate and make you sweat. This is known as moderate-intensity exercise. ? Try to do strengthening exercises at least twice each week. Do these in addition to the moderate-intensity exercise.  Know your numbers.Ask your health care provider to check your cholesterol and your blood glucose. Continue to have your blood tested as directed by your health care provider.  What should I know about cancer screening? There are several types of cancer. Take the following steps to reduce your risk and to catch any cancer development as early as possible. Breast Cancer  Practice breast self-awareness. ? This means understanding how your breasts normally appear and feel. ? It also means doing regular breast self-exams. Let your health care provider know about any changes, no matter how small.  If you are 69 or older, have a clinician do a breast exam (clinical breast exam or CBE) every year. Depending on your age, family history, and medical history, it may be recommended that you also have a yearly breast X-ray (mammogram).  If you have a family history of breast cancer, talk with your health care provider about genetic screening.  If you are at high risk for breast cancer, talk with your health care provider about having an MRI and a mammogram every year.  Breast cancer (BRCA) gene test is recommended for women who have family members with BRCA-related cancers. Results of the assessment will determine the need for genetic counseling and BRCA1 and for BRCA2 testing. BRCA-related cancers include these types: ? Breast. This occurs in males or females. ? Ovarian. ? Tubal. This may also be called fallopian tube cancer. ? Cancer of the abdominal or pelvic lining (peritoneal  cancer). ? Prostate. ? Pancreatic.  Cervical, Uterine, and Ovarian Cancer Your health care provider may recommend that you be screened regularly for cancer of the pelvic organs. These include your ovaries, uterus, and vagina. This screening involves a pelvic exam, which includes checking for microscopic changes to the surface of your cervix (Pap test).  For women ages 21-65, health care providers may recommend a pelvic exam and a Pap test every three years. For women ages 36-65, they may recommend the Pap test and pelvic exam, combined with testing for human papilloma virus (HPV), every five years. Some types of HPV increase your risk of cervical cancer. Testing for HPV may also be done on women of any age who have unclear Pap test results.  Other health care providers may not recommend any screening for nonpregnant women who are considered low risk for pelvic cancer and have no symptoms. Ask your health care provider if a screening pelvic exam is right for you.  If you have had past treatment for cervical cancer or a condition that could lead to cancer, you need Pap tests and screening for cancer for at least 20 years after your treatment. If Pap tests have been discontinued for you, your risk factors (  such as having a new sexual partner) need to be reassessed to determine if you should start having screenings again. Some women have medical problems that increase the chance of getting cervical cancer. In these cases, your health care provider may recommend that you have screening and Pap tests more often.  If you have a family history of uterine cancer or ovarian cancer, talk with your health care provider about genetic screening.  If you have vaginal bleeding after reaching menopause, tell your health care provider.  There are currently no reliable tests available to screen for ovarian cancer.  Lung Cancer Lung cancer screening is recommended for adults 85-79 years old who are at high risk for  lung cancer because of a history of smoking. A yearly low-dose CT scan of the lungs is recommended if you:  Currently smoke.  Have a history of at least 30 pack-years of smoking and you currently smoke or have quit within the past 15 years. A pack-year is smoking an average of one pack of cigarettes per day for one year.  Yearly screening should:  Continue until it has been 15 years since you quit.  Stop if you develop a health problem that would prevent you from having lung cancer treatment.  Colorectal Cancer  This type of cancer can be detected and can often be prevented.  Routine colorectal cancer screening usually begins at age 44 and continues through age 49.  If you have risk factors for colon cancer, your health care provider may recommend that you be screened at an earlier age.  If you have a family history of colorectal cancer, talk with your health care provider about genetic screening.  Your health care provider may also recommend using home test kits to check for hidden blood in your stool.  A small camera at the end of a tube can be used to examine your colon directly (sigmoidoscopy or colonoscopy). This is done to check for the earliest forms of colorectal cancer.  Direct examination of the colon should be repeated every 5-10 years until age 58. However, if early forms of precancerous polyps or small growths are found or if you have a family history or genetic risk for colorectal cancer, you may need to be screened more often.  Skin Cancer  Check your skin from head to toe regularly.  Monitor any moles. Be sure to tell your health care provider: ? About any new moles or changes in moles, especially if there is a change in a mole's shape or color. ? If you have a mole that is larger than the size of a pencil eraser.  If any of your family members has a history of skin cancer, especially at a young age, talk with your health care provider about genetic  screening.  Always use sunscreen. Apply sunscreen liberally and repeatedly throughout the day.  Whenever you are outside, protect yourself by wearing long sleeves, pants, a wide-brimmed hat, and sunglasses.  What should I know about osteoporosis? Osteoporosis is a condition in which bone destruction happens more quickly than new bone creation. After menopause, you may be at an increased risk for osteoporosis. To help prevent osteoporosis or the bone fractures that can happen because of osteoporosis, the following is recommended:  If you are 55-85 years old, get at least 1,000 mg of calcium and at least 600 mg of vitamin D per day.  If you are older than age 69 but younger than age 65, get at least 1,200 mg of  calcium and at least 600 mg of vitamin D per day.  If you are older than age 34, get at least 1,200 mg of calcium and at least 800 mg of vitamin D per day.  Smoking and excessive alcohol intake increase the risk of osteoporosis. Eat foods that are rich in calcium and vitamin D, and do weight-bearing exercises several times each week as directed by your health care provider. What should I know about how menopause affects my mental health? Depression may occur at any age, but it is more common as you become older. Common symptoms of depression include:  Low or sad mood.  Changes in sleep patterns.  Changes in appetite or eating patterns.  Feeling an overall lack of motivation or enjoyment of activities that you previously enjoyed.  Frequent crying spells.  Talk with your health care provider if you think that you are experiencing depression. What should I know about immunizations? It is important that you get and maintain your immunizations. These include:  Tetanus, diphtheria, and pertussis (Tdap) booster vaccine.  Influenza every year before the flu season begins.  Pneumonia vaccine.  Shingles vaccine.  Your health care provider may also recommend other  immunizations. This information is not intended to replace advice given to you by your health care provider. Make sure you discuss any questions you have with your health care provider. Document Released: 11/25/2005 Document Revised: 04/22/2016 Document Reviewed: 07/07/2015 Elsevier Interactive Patient Education  2018 [employer].

## 2018-06-14 NOTE — Assessment & Plan Note (Signed)
hgba1c acceptable, minimize simple carbs. Increase exercise as tolerated.  

## 2018-06-14 NOTE — Assessment & Plan Note (Signed)
encouraged heart healthy diet, avoid trans fats, minimize simple carbs and saturated fats. Increase exercise as tolerated 

## 2018-06-14 NOTE — Assessment & Plan Note (Signed)
Encouraged increased hydration, 64 ounces of clear fluids daily. Minimize alcohol and caffeine. Eat small frequent meals with lean proteins and complex carbs. Avoid high and low blood sugars. Get adequate sleep, 7-8 hours a night. Needs exercise daily preferably in the morning.  

## 2018-06-14 NOTE — Patient Instructions (Addendum)
Shingrix is the new shingles shot 2 shots over 2-6 months at the pharmacy.  Call insurance regarding hep c testing for preventative purposes per CDC guidelines code z72.89  Preventive Care 69 Years and Older, Female Preventive care refers to lifestyle choices and visits with your health care provider that can promote health and wellness. What does preventive care include?  A yearly physical exam. This is also called an annual well check.  Dental exams once or twice a year.  Routine eye exams. Ask your health care provider how often you should have your eyes checked.  Personal lifestyle choices, including: ? Daily care of your teeth and gums. ? Regular physical activity. ? Eating a healthy diet. ? Avoiding tobacco and drug use. ? Limiting alcohol use. ? Practicing safe sex. ? Taking low-dose aspirin every day. ? Taking vitamin and mineral supplements as recommended by your health care provider. What happens during an annual well check? The services and screenings done by your health care provider during your annual well check will depend on your age, overall health, lifestyle risk factors, and family history of disease. Counseling Your health care provider may ask you questions about your:  Alcohol use.  Tobacco use.  Drug use.  Emotional well-being.  Home and relationship well-being.  Sexual activity.  Eating habits.  History of falls.  Memory and ability to understand (cognition).  Work and work Statistician.  Reproductive health.  Screening You may have the following tests or measurements:  Height, weight, and BMI.  Blood pressure.  Lipid and cholesterol levels. These may be checked every 5 years, or more frequently if you are over 73 years old.  Skin check.  Lung cancer screening. You may have this screening every year starting at age 76 if you have a 30-pack-year history of smoking and currently smoke or have quit within the past 15 years.  Fecal  occult blood test (FOBT) of the stool. You may have this test every year starting at age 28.  Flexible sigmoidoscopy or colonoscopy. You may have a sigmoidoscopy every 5 years or a colonoscopy every 10 years starting at age 25.  Hepatitis C blood test.  Hepatitis B blood test.  Sexually transmitted disease (STD) testing.  Diabetes screening. This is done by checking your blood sugar (glucose) after you have not eaten for a while (fasting). You may have this done every 1-3 years.  Bone density scan. This is done to screen for osteoporosis. You may have this done starting at age 56.  Mammogram. This may be done every 1-2 years. Talk to your health care provider about how often you should have regular mammograms.  Talk with your health care provider about your test results, treatment options, and if necessary, the need for more tests. Vaccines Your health care provider may recommend certain vaccines, such as:  Influenza vaccine. This is recommended every year.  Tetanus, diphtheria, and acellular pertussis (Tdap, Td) vaccine. You may need a Td booster every 10 years.  Varicella vaccine. You may need this if you have not been vaccinated.  Zoster vaccine. You may need this after age 57.  Measles, mumps, and rubella (MMR) vaccine. You may need at least one dose of MMR if you were born in 1957 or later. You may also need a second dose.  Pneumococcal 13-valent conjugate (PCV13) vaccine. One dose is recommended after age 63.  Pneumococcal polysaccharide (PPSV23) vaccine. One dose is recommended after age 69.  Meningococcal vaccine. You may need this if you have  certain conditions.  Hepatitis A vaccine. You may need this if you have certain conditions or if you travel or work in places where you may be exposed to hepatitis A.  Hepatitis B vaccine. You may need this if you have certain conditions or if you travel or work in places where you may be exposed to hepatitis B.  Haemophilus  influenzae type b (Hib) vaccine. You may need this if you have certain conditions.  Talk to your health care provider about which screenings and vaccines you need and how often you need them. This information is not intended to replace advice given to you by your health care provider. Make sure you discuss any questions you have with your health care provider. Document Released: 10/30/2015 Document Revised: 06/22/2016 Document Reviewed: 08/04/2015 Elsevier Interactive Patient Education  [employer].

## 2018-06-14 NOTE — Assessment & Plan Note (Signed)
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

## 2018-06-14 NOTE — Assessment & Plan Note (Signed)
Patient encouraged to maintain heart healthy diet, regular exercise, adequate sleep. Consider daily probiotics. Take medications as prescribed. Sees gyn for paps, MGM. Colonoscopy is due in 2021

## 2018-06-18 NOTE — Progress Notes (Signed)
Subjective:    Patient ID: Donna Guerra, female    DOB: 02/03/1951, 67 y.o.   MRN: [id number]  No chief complaint on file.   HPI Patient is in today for annual preventative exam and follow up on chronic medical concerns including migraines, hyperlipidemia, hyperglycemia and more. She feels well today. No recent febrile illness or hospitalizations. No polyuria or polydipsia. She is eating a heart healthy diet and stays active. She is managing he activities of daily living. He headaches continue to occur but infrequently and her meds are helpful. Denies CP/palp/SOB/congestion/fevers/GI or GU c/o. Taking meds as prescribed  Past Medical History:  Diagnosis Date  . Back pain 04/11/2017  . CIN I (cervical intraepithelial neoplasia I) 2005  . Colon polyps 07/08/2012  . Hyperglycemia 06/13/2017  . Hyperlipidemia 08/29/2012  . Migraines   . Osteopenia 03/2018   T score -1.6 FRAX 11% / 2.9% stable from prior DEXA  . Right shoulder pain 07/08/2012  . Skin cancer 07/08/2012   Basal cell    Past Surgical History:  Procedure Laterality Date  . ABDOMINAL SURGERY     Tummy tuck  . [place]  . CHOLECYSTECTOMY    . COLPOSCOPY    . COSMETIC SURGERY    . GYNECOLOGIC CRYOSURGERY    . OOPHORECTOMY  2002   BSO  . SHOULDER SURGERY     right rotator cuff repair  . TONSILLECTOMY AND ADENOIDECTOMY     age 32  . TUBAL LIGATION    . VAGINAL HYSTERECTOMY  2002   LAVH BSO    Family History  Problem Relation Age of Onset  . Hypertension Mother   . Breast cancer Mother 11  . Colon cancer Mother 63  . Breast cancer Maternal Aunt 80  . Breast cancer Maternal Aunt        50's  . Migraines Father   . Migraines Sister   . Cancer Sister        Melanoma  . Diabetes Maternal Grandmother   . Breast cancer Paternal Aunt        dx over 78  . Breast cancer Sister 65  . Cirrhosis Sister   . Breast cancer Other 20       DCIS - niece  . Prostate cancer Cousin        maternal  first cousin  . Colon cancer Cousin        maternal first cousin  . Cancer Cousin        unknown cancer  . Breast cancer Cousin 61       TN breast cancer - maternal first cousin    Social History   Socioeconomic History  . Marital status: Married    Spouse name: Not on file  . Number of children: Not on file  . Years of education: Not on file  . Highest education level: Not on file  Occupational History  . Not on file  Social Needs  . Financial resource strain: Not on file  . Food insecurity:    Worry: Not on file    Inability: Not on file  . Transportation needs:    Medical: Not on file    Non-medical: Not on file  Tobacco Use  . Smoking status: Never Smoker  . Smokeless tobacco: Never Used  Substance and Sexual Activity  . Alcohol use: Yes    Alcohol/week: 0.0 standard drinks    Comment: occasionally  . Drug use: No  . Sexual  activity: Yes    Partners: Male    Birth control/protection: Surgical    Comment: HYST-1st intercourse 67 yo-Fewer than 5 partners  Lifestyle  . Physical activity:    Days per week: Not on file    Minutes per session: Not on file  . Stress: Not on file  Relationships  . Social connections:    Talks on phone: Not on file    Gets together: Not on file    Attends religious service: Not on file    Active member of club or organization: Not on file    Attends meetings of clubs or organizations: Not on file    Relationship status: Not on file  . Intimate partner violence:    Fear of current or ex partner: Not on file    Emotionally abused: Not on file    Physically abused: Not on file    Forced sexual activity: Not on file  Other Topics Concern  . Not on file  Social History Narrative  . Not on file    Outpatient Medications Prior to Visit  Medication Sig Dispense Refill  . Cholecalciferol (VITAMIN D PO) Take 2,000 Units by mouth.    . Cyanocobalamin (VITAMIN B-12 PO) Take by mouth daily. Take 2500 daily    . estradiol (ESTRACE) 1  MG tablet 1/2 tab daily 30 tablet 12  . fish oil-omega-3 fatty acids 1000 MG capsule Take 1 g by mouth daily.    . NONFORMULARY OR COMPOUNDED ITEM Estradiol vaginal 0.02% cream insert twice weekly vaginally 90 each 3  . SUMAtriptan (IMITREX) 100 MG tablet Take 1 tablet (100 mg total) by mouth as directed. 9 tablet 11  . atorvastatin (LIPITOR) 40 MG tablet Take 1 tablet (40 mg total) by mouth daily. 30 tablet 11  . fluticasone (FLONASE) 50 MCG/ACT nasal spray Place 2 sprays into both nostrils daily. 16 g 1   No facility-administered medications prior to visit.     No Known Allergies  Review of Systems  Constitutional: Negative for fever and malaise/fatigue.  HENT: Negative for congestion.   Eyes: Negative for blurred vision.  Respiratory: Negative for shortness of breath.   Cardiovascular: Negative for chest pain, palpitations and leg swelling.  Gastrointestinal: Negative for abdominal pain, blood in stool and nausea.  Genitourinary: Negative for dysuria and frequency.  Musculoskeletal: Negative for falls.  Skin: Negative for rash.  Neurological: Positive for headaches. Negative for dizziness and loss of consciousness.  Endo/Heme/Allergies: Negative for environmental allergies.  Psychiatric/Behavioral: Negative for depression. The patient is not nervous/anxious.        Objective:    Physical Exam  Constitutional: She is oriented to person, place, and time. She appears well-developed and well-nourished. No distress.  HENT:  Head: Normocephalic and atraumatic.  Eyes: Conjunctivae are normal.  Neck: Neck supple. No thyromegaly present.  Cardiovascular: Normal rate, regular rhythm and normal heart sounds.  No murmur heard. Pulmonary/Chest: Effort normal and breath sounds normal. No respiratory distress.  Abdominal: Soft. Bowel sounds are normal. She exhibits no distension and no mass. There is no tenderness.  Musculoskeletal: She exhibits no edema.  Lymphadenopathy:    She has no  cervical adenopathy.  Neurological: She is alert and oriented to person, place, and time.  Skin: Skin is warm and dry.  Psychiatric: She has a normal mood and affect. Her behavior is normal.    BP 98/62 (BP Location: Left Arm, Patient Position: Sitting, Cuff Size: Normal)   Pulse 70   Temp 98.1 F (  36.7 C) (Oral)   Resp 18   Ht 5' 3.5" (1.613 m)   Wt 124 lb 9.6 oz (56.5 kg)   SpO2 98%   BMI 21.73 kg/m  Wt Readings from Last 3 Encounters:  06/14/18 124 lb (56.2 kg)  06/14/18 124 lb 9.6 oz (56.5 kg)  01/25/18 125 lb (56.7 kg)     Lab Results  Component Value Date   WBC 7.1 06/14/2018   HGB 14.7 06/14/2018   HCT 42.6 06/14/2018   PLT 294.0 06/14/2018   GLUCOSE 109 (H) 06/14/2018   CHOL 162 06/14/2018   TRIG 108.0 06/14/2018   HDL 58.60 06/14/2018   LDLCALC 82 06/14/2018   ALT 34 06/14/2018   AST 24 06/14/2018   NA 139 06/14/2018   K 4.5 06/14/2018   CL 101 06/14/2018   CREATININE 0.82 06/14/2018   BUN 18 06/14/2018   CO2 31 06/14/2018   TSH 2.28 06/14/2018   HGBA1C 5.7 06/14/2018    Lab Results  Component Value Date   TSH 2.28 06/14/2018   Lab Results  Component Value Date   WBC 7.1 06/14/2018   HGB 14.7 06/14/2018   HCT 42.6 06/14/2018   MCV 96.5 06/14/2018   PLT 294.0 06/14/2018   Lab Results  Component Value Date   NA 139 06/14/2018   K 4.5 06/14/2018   CO2 31 06/14/2018   GLUCOSE 109 (H) 06/14/2018   BUN 18 06/14/2018   CREATININE 0.82 06/14/2018   BILITOT 1.2 06/14/2018   ALKPHOS 89 06/14/2018   AST 24 06/14/2018   ALT 34 06/14/2018   PROT 7.3 06/14/2018   ALBUMIN 4.6 06/14/2018   CALCIUM 10.5 06/14/2018   GFR 73.43 06/14/2018   Lab Results  Component Value Date   CHOL 162 06/14/2018   Lab Results  Component Value Date   HDL 58.60 06/14/2018   Lab Results  Component Value Date   LDLCALC 82 06/14/2018   Lab Results  Component Value Date   TRIG 108.0 06/14/2018   Lab Results  Component Value Date   CHOLHDL 3 06/14/2018    Lab Results  Component Value Date   HGBA1C 5.7 06/14/2018       Assessment & Plan:   Problem List Items Addressed This Visit    Migraines    Encouraged increased hydration, 64 ounces of clear fluids daily. Minimize alcohol and caffeine. Eat small frequent meals with lean proteins and complex carbs. Avoid high and low blood sugars. Get adequate sleep, 7-8 hours a night. Needs exercise daily preferably in the morning.      Relevant Medications   atorvastatin (LIPITOR) 40 MG tablet   Other Relevant Orders   CBC (Completed)   Comprehensive metabolic panel (Completed)   Preventative health care    Patient encouraged to maintain heart healthy diet, regular exercise, adequate sleep. Consider daily probiotics. Take medications as prescribed. Sees gyn for paps, MGM. Colonoscopy is due in 2021      Relevant Orders   Comprehensive metabolic panel (Completed)   Hyperlipidemia     encouraged heart healthy diet, avoid trans fats, minimize simple carbs and saturated fats. Increase exercise as tolerated      Relevant Medications   atorvastatin (LIPITOR) 40 MG tablet   Other Relevant Orders   Lipid panel (Completed)   TSH (Completed)   Hyperglycemia    hgba1c acceptable, minimize simple carbs. Increase exercise as tolerated.       Relevant Orders   Hemoglobin A1c (Completed)   TSH (Completed)  Family history of colon cancer    Next colonoscopy due 2021 had some genetic testing done which showed an abnormal gene that increased her risk of colon cancer       Other Visit Diagnoses    Other problems related to lifestyle    -  Primary      I have discontinued Rhen B. Racanelli's fluticasone. I am also having her maintain her fish oil-omega-3 fatty acids, Cholecalciferol (VITAMIN D PO), Cyanocobalamin (VITAMIN B-12 PO), SUMAtriptan, estradiol, NONFORMULARY OR COMPOUNDED ITEM, and atorvastatin.  Meds ordered this encounter  Medications  . atorvastatin (LIPITOR) 40 MG tablet    Sig:  Take 1 tablet (40 mg total) by mouth daily.    Dispense:  30 tablet    Refill:  11     Penni Homans, MD

## 2018-06-26 ENCOUNTER — Telehealth: Payer: Self-pay

## 2018-06-26 DIAGNOSIS — Z7289 Other problems related to lifestyle: Secondary | ICD-10-CM

## 2018-06-26 DIAGNOSIS — Z1159 Encounter for screening for other viral diseases: Secondary | ICD-10-CM

## 2018-06-26 NOTE — Telephone Encounter (Signed)
Copied from [place] [id number]. Topic: General - Other >> Jun 26, 2018  1:53 PM Oneta Rack wrote: Relation to pt: self  Call back [phone] Pharmacy:  Reason for call:  As per 06/14/18 AVS call insurance regarding hep c testing for preventative purposes per CDC guidelines code z72.89. Patient contacted insurance and was advised Hep C will be cover only if the following codes are used  procedure code [phone] and CDC z72.89, please advise when orders are place

## 2018-06-26 NOTE — Telephone Encounter (Signed)
I spoke with Dawn she stated we can use code z79.89 along with F19.20, if the patient was born b/t [id number] and also there can a single screen per lifetime and dx code should be Z11.59.   I will order Hep C if approved by PCP   [place] if approved will you call patient for lab visit    Please advise

## 2018-06-26 NOTE — Telephone Encounter (Signed)
Please order for the first two codes I guess.

## 2018-06-27 NOTE — Telephone Encounter (Signed)
Pt is scheduled for 07/03/18 @ 9am for a lab visit

## 2018-06-28 NOTE — Telephone Encounter (Signed)
Orders placed.

## 2018-07-03 ENCOUNTER — Other Ambulatory Visit (INDEPENDENT_AMBULATORY_CARE_PROVIDER_SITE_OTHER): Payer: Medicare HMO

## 2018-07-03 DIAGNOSIS — Z7289 Other problems related to lifestyle: Secondary | ICD-10-CM

## 2018-07-03 DIAGNOSIS — Z1159 Encounter for screening for other viral diseases: Secondary | ICD-10-CM | POA: Diagnosis not present

## 2018-07-04 LAB — HEPATITIS C ANTIBODY
HEP C AB: NONREACTIVE
SIGNAL TO CUT-OFF: 0.02 (ref <1.00)

## 2018-08-01 DIAGNOSIS — L570 Actinic keratosis: Secondary | ICD-10-CM | POA: Diagnosis not present

## 2018-08-01 DIAGNOSIS — L820 Inflamed seborrheic keratosis: Secondary | ICD-10-CM | POA: Diagnosis not present

## 2018-08-01 DIAGNOSIS — Z85828 Personal history of other malignant neoplasm of skin: Secondary | ICD-10-CM | POA: Diagnosis not present

## 2018-08-13 DIAGNOSIS — R69 Illness, unspecified: Secondary | ICD-10-CM | POA: Diagnosis not present

## 2018-09-11 ENCOUNTER — Encounter: Payer: Self-pay | Admitting: Licensed Clinical Social Worker

## 2018-09-11 NOTE — Progress Notes (Signed)
Addendum: MSH3 c.2041C>T (p.Pro681Ser) VUS has been reclassified to "Likely Benign." The report date is 08/22/2018.

## 2018-11-05 DIAGNOSIS — R69 Illness, unspecified: Secondary | ICD-10-CM | POA: Diagnosis not present

## 2018-12-13 DIAGNOSIS — L570 Actinic keratosis: Secondary | ICD-10-CM | POA: Diagnosis not present

## 2018-12-13 DIAGNOSIS — L821 Other seborrheic keratosis: Secondary | ICD-10-CM | POA: Diagnosis not present

## 2018-12-13 DIAGNOSIS — Z85828 Personal history of other malignant neoplasm of skin: Secondary | ICD-10-CM | POA: Diagnosis not present

## 2018-12-24 ENCOUNTER — Other Ambulatory Visit: Payer: Self-pay | Admitting: Family Medicine

## 2018-12-24 DIAGNOSIS — G43809 Other migraine, not intractable, without status migrainosus: Secondary | ICD-10-CM

## 2018-12-26 NOTE — Telephone Encounter (Signed)
Pt would like to know if she could get more refills until she needs to come back in august. Please advise

## 2018-12-28 NOTE — Telephone Encounter (Signed)
Please advise 

## 2018-12-30 ENCOUNTER — Other Ambulatory Visit: Payer: Self-pay | Admitting: Family Medicine

## 2018-12-30 DIAGNOSIS — G43809 Other migraine, not intractable, without status migrainosus: Secondary | ICD-10-CM

## 2018-12-30 MED ORDER — SUMATRIPTAN SUCCINATE 100 MG PO TABS: 100.0000 mg | ORAL_TABLET | ORAL | 5 refills | Status: DC

## 2018-12-30 NOTE — Telephone Encounter (Signed)
I have sent in refills please let her know

## 2018-12-31 NOTE — Telephone Encounter (Signed)
Spoke w/ Pt- informed that Rx's have been sent.

## 2019-01-29 ENCOUNTER — Other Ambulatory Visit: Payer: Self-pay | Admitting: Gynecology

## 2019-02-04 ENCOUNTER — Other Ambulatory Visit: Payer: Self-pay

## 2019-02-06 ENCOUNTER — Encounter: Payer: Self-pay | Admitting: Gynecology

## 2019-02-06 ENCOUNTER — Other Ambulatory Visit: Payer: Self-pay

## 2019-02-06 ENCOUNTER — Ambulatory Visit (INDEPENDENT_AMBULATORY_CARE_PROVIDER_SITE_OTHER): Payer: Medicare HMO | Admitting: Gynecology

## 2019-02-06 VITALS — BP 124/76 | Ht 63.0 in | Wt 126.0 lb

## 2019-02-06 DIAGNOSIS — Z79890 Hormone replacement therapy (postmenopausal): Secondary | ICD-10-CM

## 2019-02-06 DIAGNOSIS — M8589 Other specified disorders of bone density and structure, multiple sites: Secondary | ICD-10-CM

## 2019-02-06 DIAGNOSIS — Z01419 Encounter for gynecological examination (general) (routine) without abnormal findings: Secondary | ICD-10-CM | POA: Diagnosis not present

## 2019-02-06 DIAGNOSIS — N952 Postmenopausal atrophic vaginitis: Secondary | ICD-10-CM | POA: Diagnosis not present

## 2019-02-06 MED ORDER — ESTRADIOL 1 MG PO TABS: ORAL_TABLET | ORAL | 6 refills | Status: DC

## 2019-02-06 NOTE — Progress Notes (Signed)
    Donna Guerra [birth year] [id number]        67 y.o.  G2P2002 for breast and pelvic exam.  Without gynecologic complaints.  Several issues noted below.  Past medical history,surgical history, problem list, medications, allergies, family history and social history were all reviewed and documented as reviewed in the EPIC chart.  ROS:  Performed with pertinent positives and negatives included in the history, assessment and plan.   Additional significant findings : None   Exam: [name] assistant Vitals:   02/06/19 1101  BP: 124/76  Weight: 126 lb (57.2 kg)  Height: 5\' 3"  (1.6 m)   Body mass index is 22.32 kg/m.  General appearance:  Normal affect, orientation and appearance. Skin: Grossly normal HEENT: Without gross lesions.  No cervical or supraclavicular adenopathy. Thyroid normal.  Lungs:  Clear without wheezing, rales or rhonchi Cardiac: RR, without RMG Abdominal:  Soft, nontender, without masses, guarding, rebound, organomegaly or hernia Breasts:  Examined lying and sitting without masses, retractions, discharge or axillary adenopathy. Pelvic:  Ext, BUS, Vagina: With atrophic changes  Adnexa: Without masses or tenderness    Anus and perineum: Normal   Rectovaginal: Normal sphincter tone without palpated masses or tenderness.    Assessment/Plan:  68 y.o. E3O1224 female for breast and pelvic exam.  [name] for bleeding, pain, leiomyoma, simple hyperplasia and adenomyosis  1. Postmenopausal/HRT.  Continues on estradiol 0.5 mg daily and formulated vaginal estrogen cream twice weekly.  Has tried weaning but had unacceptable hot flushes and sweats.  We again discussed the risks versus benefits.  We discussed thrombosis such as stroke heart attack DVT in the breast cancer issue.  At this point she would like to continue and I refilled both x1 year. 2. Osteopenia.  DEXA 2019 T score -1.6 FRAX 11% / 2.9%.  Stable from prior DEXA.  Plan repeat DEXA in another  year or so. 3. Pap smear 2019.  No Pap smear done today.  No history of abnormal Pap smears.  We again discussed current screening guidelines and options to stop screening based on age and hysterectomy history reviewed.  Will readdress on an annual basis. 4. Colonoscopy 2016.  Repeat at their recommended interval. 5. Mammography 05/2018.  Continue with annual mammography when due.  Breast exam normal today. 6. Health maintenance.  No routine lab work done as patient does this elsewhere.  Follow-up 1 year, sooner as needed.  Anastasio Auerbach MD, 11:31 AM 02/06/2019

## 2019-02-06 NOTE — Patient Instructions (Signed)
Follow-up in 1 year for annual exam, sooner if any issues. 

## 2019-02-07 ENCOUNTER — Telehealth: Payer: Self-pay | Admitting: *Deleted

## 2019-02-07 MED ORDER — NONFORMULARY OR COMPOUNDED ITEM: 3 refills | Status: DC

## 2019-02-07 NOTE — Telephone Encounter (Signed)
-----   Message from Anastasio Auerbach, MD sent at 02/06/2019 11:34 AM EDT ----- Call in refill to [facility] for her formulated vaginal estradiol cream twice weekly prefilled syringes.  90-month supply refill x1 year

## 2019-02-07 NOTE — Telephone Encounter (Signed)
Rx called in 

## 2019-03-19 DIAGNOSIS — R69 Illness, unspecified: Secondary | ICD-10-CM | POA: Diagnosis not present

## 2019-05-21 ENCOUNTER — Telehealth: Payer: Self-pay | Admitting: *Deleted

## 2019-05-21 NOTE — Telephone Encounter (Signed)
PA done via cover my meds for estradiol 1 mg tablet , will wait for [employer] Part D.   Medication approved A Case: [id number], Status: Approved, Coverage Starts on: 10/15/2018, Coverage Ends on: 10/17/2019. Questions? Contact [phone].

## 2019-05-31 ENCOUNTER — Other Ambulatory Visit: Payer: Self-pay | Admitting: Gynecology

## 2019-05-31 DIAGNOSIS — Z1231 Encounter for screening mammogram for malignant neoplasm of breast: Secondary | ICD-10-CM

## 2019-06-06 DIAGNOSIS — D485 Neoplasm of uncertain behavior of skin: Secondary | ICD-10-CM | POA: Diagnosis not present

## 2019-06-06 DIAGNOSIS — Z85828 Personal history of other malignant neoplasm of skin: Secondary | ICD-10-CM | POA: Diagnosis not present

## 2019-06-06 DIAGNOSIS — C44519 Basal cell carcinoma of skin of other part of trunk: Secondary | ICD-10-CM | POA: Diagnosis not present

## 2019-06-06 DIAGNOSIS — L820 Inflamed seborrheic keratosis: Secondary | ICD-10-CM | POA: Diagnosis not present

## 2019-06-06 DIAGNOSIS — L821 Other seborrheic keratosis: Secondary | ICD-10-CM | POA: Diagnosis not present

## 2019-06-10 ENCOUNTER — Other Ambulatory Visit: Payer: Self-pay

## 2019-06-10 ENCOUNTER — Ambulatory Visit (INDEPENDENT_AMBULATORY_CARE_PROVIDER_SITE_OTHER): Payer: Medicare HMO | Admitting: Family Medicine

## 2019-06-10 ENCOUNTER — Encounter: Payer: Self-pay | Admitting: Family Medicine

## 2019-06-10 VITALS — BP 120/80 | Wt 124.0 lb

## 2019-06-10 DIAGNOSIS — G43809 Other migraine, not intractable, without status migrainosus: Secondary | ICD-10-CM

## 2019-06-10 DIAGNOSIS — E785 Hyperlipidemia, unspecified: Secondary | ICD-10-CM

## 2019-06-10 DIAGNOSIS — M8589 Other specified disorders of bone density and structure, multiple sites: Secondary | ICD-10-CM | POA: Diagnosis not present

## 2019-06-10 DIAGNOSIS — R739 Hyperglycemia, unspecified: Secondary | ICD-10-CM | POA: Diagnosis not present

## 2019-06-10 MED ORDER — ATORVASTATIN CALCIUM 40 MG PO TABS: 40.0000 mg | ORAL_TABLET | Freq: Every day | ORAL | 3 refills | Status: DC

## 2019-06-10 MED ORDER — SUMATRIPTAN SUCCINATE 100 MG PO TABS: 100.0000 mg | ORAL_TABLET | ORAL | 5 refills | Status: DC

## 2019-06-12 ENCOUNTER — Telehealth: Payer: Self-pay | Admitting: Family Medicine

## 2019-06-12 NOTE — Telephone Encounter (Signed)
LM for pt to call and schedule lab appt in a few weeks and f/u 6 mn.

## 2019-06-16 NOTE — Assessment & Plan Note (Signed)
hgba1c acceptable, minimize simple carbs. Increase exercise as tolerated.  

## 2019-06-16 NOTE — Assessment & Plan Note (Signed)
Encouraged to get adequate exercise, calcium and vitamin d intake 

## 2019-06-16 NOTE — Assessment & Plan Note (Signed)
Encouraged increased hydration, 64 ounces of clear fluids daily. Minimize alcohol and caffeine. Eat small frequent meals with lean proteins and complex carbs. Avoid high and low blood sugars. Get adequate sleep, 7-8 hours a night. Needs exercise daily preferably in the morning. Given refill on Sumitriptan.

## 2019-06-16 NOTE — Assessment & Plan Note (Addendum)
Encouraged heart healthy diet, increase exercise, avoid trans fats, consider a krill oil cap daily. Given refill on statin

## 2019-06-16 NOTE — Progress Notes (Signed)
Virtual Visit via Video Note  I connected with Donna Guerra on [id number] at  2:20 PM EDT by a video enabled telemedicine application and verified that I am speaking with the correct person using two identifiers.  Location: Patient: home Provider: office   I discussed the limitations of evaluation and management by telemedicine and the availability of in person appointments. The patient expressed understanding and agreed to proceed. Donna Guerra, CMA was able to get patient set up on visit, video   Subjective:    Patient ID: Donna Guerra, female    DOB: 11/20/1950, 68 y.o.   MRN: [id number]  Chief Complaint  Patient presents with  . Follow-up    HPI Patient is in today for follow up on chronic medical concerns including hyperlipidemia, migrains, hyperglycemia and more. No recent febrile illness or hospitalizations. No polyuria or polydipsia. She is maintaining quarantine well and offers no acute concerns. Is trying to maintain a heart healthy diet and stay active.   Past Medical History:  Diagnosis Date  . Back pain 04/11/2017  . CIN I (cervical intraepithelial neoplasia I) 2005  . Colon polyps 07/08/2012  . Hyperglycemia 06/13/2017  . Hyperlipidemia 08/29/2012  . Migraines   . Osteopenia 03/2018   T score -1.6 FRAX 11% / 2.9% stable from prior DEXA  . Right shoulder pain 07/08/2012  . Skin cancer 07/08/2012   Basal cell    Past Surgical History:  Procedure Laterality Date  . ABDOMINAL SURGERY     Tummy tuck  . [place]  . CHOLECYSTECTOMY    . COLPOSCOPY    . COSMETIC SURGERY    . GYNECOLOGIC CRYOSURGERY    . OOPHORECTOMY  2002   BSO  . SHOULDER SURGERY     right rotator cuff repair  . TONSILLECTOMY AND ADENOIDECTOMY     age 78  . TUBAL LIGATION    . VAGINAL HYSTERECTOMY  2002   [facility] BSO    Family History  Problem Relation Age of Onset  . Hypertension Mother   . Breast cancer Mother 32  . Colon cancer Mother 42  . Breast cancer  Maternal Aunt 80  . Breast cancer Maternal Aunt        50's  . Migraines Father   . Migraines Sister   . Cancer Sister        Melanoma  . Diabetes Maternal Grandmother   . Breast cancer Paternal Aunt        dx over 83  . Breast cancer Sister 53  . Cirrhosis Sister   . Breast cancer Other 34       DCIS - niece  . Prostate cancer Cousin        maternal first cousin  . Colon cancer Cousin        maternal first cousin  . Cancer Cousin        unknown cancer  . Breast cancer Cousin 80       TN breast cancer - maternal first cousin    Social History   Socioeconomic History  . Marital status: Married    Spouse name: Not on file  . Number of children: Not on file  . Years of education: Not on file  . Highest education level: Not on file  Occupational History  . Not on file  Social Needs  . Financial resource strain: Not on file  . Food insecurity    Worry: Not on file    Inability: Not  on file  . Transportation needs    Medical: Not on file    Non-medical: Not on file  Tobacco Use  . Smoking status: Never Smoker  . Smokeless tobacco: Never Used  Substance and Sexual Activity  . Alcohol use: Yes    Alcohol/week: 0.0 standard drinks    Comment: occasionally  . Drug use: No  . Sexual activity: Yes    Partners: Male    Birth control/protection: Surgical    Comment: HYST-1st intercourse 68 yo-Fewer than 5 partners  Lifestyle  . Physical activity    Days per week: Not on file    Minutes per session: Not on file  . Stress: Not on file  Relationships  . Social Herbalist on phone: Not on file    Gets together: Not on file    Attends religious service: Not on file    Active member of club or organization: Not on file    Attends meetings of clubs or organizations: Not on file    Relationship status: Not on file  . Intimate partner violence    Fear of current or ex partner: Not on file    Emotionally abused: Not on file    Physically abused: Not on file     Forced sexual activity: Not on file  Other Topics Concern  . Not on file  Social History Narrative  . Not on file    Outpatient Medications Prior to Visit  Medication Sig Dispense Refill  . Cholecalciferol (VITAMIN D PO) Take 2,000 Units by mouth.    . Cyanocobalamin (VITAMIN B-12 PO) Take by mouth daily. Take 2500 daily    . estradiol (ESTRACE) 1 MG tablet Take 1/2 (one-half) tablet by mouth once daily 30 tablet 6  . fish oil-omega-3 fatty acids 1000 MG capsule Take 1 g by mouth daily.    . NONFORMULARY OR COMPOUNDED ITEM Estradiol vaginal 0.02% cream insert twice weekly vaginally 90 each 3  . atorvastatin (LIPITOR) 40 MG tablet Take 1 tablet (40 mg total) by mouth daily. 30 tablet 11  . SUMAtriptan (IMITREX) 100 MG tablet Take 1 tablet (100 mg total) by mouth See admin instructions. May repeat in 2 hours if headache persists or recurs. 9 tablet 5   No facility-administered medications prior to visit.     No Known Allergies  Review of Systems  Constitutional: Negative for fever and malaise/fatigue.  HENT: Negative for congestion.   Eyes: Negative for blurred vision.  Respiratory: Negative for shortness of breath.   Cardiovascular: Negative for chest pain, palpitations and leg swelling.  Gastrointestinal: Negative for abdominal pain, blood in stool and nausea.  Genitourinary: Negative for dysuria and frequency.  Musculoskeletal: Negative for falls.  Skin: Negative for rash.  Neurological: Negative for dizziness, loss of consciousness and headaches.  Endo/Heme/Allergies: Negative for environmental allergies.  Psychiatric/Behavioral: Negative for depression. The patient is not nervous/anxious.        Objective:    Physical Exam Constitutional:      Appearance: Normal appearance. She is not ill-appearing.  HENT:     Head: Normocephalic and atraumatic.     Nose: Nose normal.  Eyes:     General:        Right eye: No discharge.        Left eye: No discharge.  Pulmonary:      Effort: Pulmonary effort is normal.  Neurological:     Mental Status: She is alert and oriented to person, place, and time.  Psychiatric:        Behavior: Behavior normal.     BP 120/80 (BP Location: Left Arm, Patient Position: Sitting, Cuff Size: Normal)   Wt 124 lb (56.2 kg)   BMI 21.97 kg/m  Wt Readings from Last 3 Encounters:  06/10/19 124 lb (56.2 kg)  02/06/19 126 lb (57.2 kg)  06/14/18 124 lb (56.2 kg)    Diabetic Foot Exam - Simple   No data filed     Lab Results  Component Value Date   WBC 7.1 06/14/2018   HGB 14.7 06/14/2018   HCT 42.6 06/14/2018   PLT 294.0 06/14/2018   GLUCOSE 109 (H) 06/14/2018   CHOL 162 06/14/2018   TRIG 108.0 06/14/2018   HDL 58.60 06/14/2018   LDLCALC 82 06/14/2018   ALT 34 06/14/2018   AST 24 06/14/2018   NA 139 06/14/2018   K 4.5 06/14/2018   CL 101 06/14/2018   CREATININE 0.82 06/14/2018   BUN 18 06/14/2018   CO2 31 06/14/2018   TSH 2.28 06/14/2018   HGBA1C 5.7 06/14/2018    Lab Results  Component Value Date   TSH 2.28 06/14/2018   Lab Results  Component Value Date   WBC 7.1 06/14/2018   HGB 14.7 06/14/2018   HCT 42.6 06/14/2018   MCV 96.5 06/14/2018   PLT 294.0 06/14/2018   Lab Results  Component Value Date   NA 139 06/14/2018   K 4.5 06/14/2018   CO2 31 06/14/2018   GLUCOSE 109 (H) 06/14/2018   BUN 18 06/14/2018   CREATININE 0.82 06/14/2018   BILITOT 1.2 06/14/2018   ALKPHOS 89 06/14/2018   AST 24 06/14/2018   ALT 34 06/14/2018   PROT 7.3 06/14/2018   ALBUMIN 4.6 06/14/2018   CALCIUM 10.5 06/14/2018   GFR 73.43 06/14/2018   Lab Results  Component Value Date   CHOL 162 06/14/2018   Lab Results  Component Value Date   HDL 58.60 06/14/2018   Lab Results  Component Value Date   LDLCALC 82 06/14/2018   Lab Results  Component Value Date   TRIG 108.0 06/14/2018   Lab Results  Component Value Date   CHOLHDL 3 06/14/2018   Lab Results  Component Value Date   HGBA1C 5.7 06/14/2018        Assessment & Plan:   Problem List Items Addressed This Visit    Osteopenia    Encouraged to get adequate exercise, calcium and vitamin d intake      Migraines    Encouraged increased hydration, 64 ounces of clear fluids daily. Minimize alcohol and caffeine. Eat small frequent meals with lean proteins and complex carbs. Avoid high and low blood sugars. Get adequate sleep, 7-8 hours a night. Needs exercise daily preferably in the morning. Given refill on Sumitriptan.      Relevant Medications   atorvastatin (LIPITOR) 40 MG tablet   SUMAtriptan (IMITREX) 100 MG tablet   Hyperlipidemia    Encouraged heart healthy diet, increase exercise, avoid trans fats, consider a krill oil cap daily. Given refill on statin      Relevant Medications   atorvastatin (LIPITOR) 40 MG tablet   Other Relevant Orders   Lipid panel   Hyperglycemia - Primary    hgba1c acceptable, minimize simple carbs. Increase exercise as tolerated.       Relevant Orders   Hemoglobin A1c   CBC   Comprehensive metabolic panel   TSH      I am having Donna Guerra maintain her  fish oil-omega-3 fatty acids, Cholecalciferol (VITAMIN D PO), Cyanocobalamin (VITAMIN B-12 PO), estradiol, NONFORMULARY OR COMPOUNDED ITEM, atorvastatin, and SUMAtriptan.  Meds ordered this encounter  Medications  . atorvastatin (LIPITOR) 40 MG tablet    Sig: Take 1 tablet (40 mg total) by mouth daily.    Dispense:  90 tablet    Refill:  3  . SUMAtriptan (IMITREX) 100 MG tablet    Sig: Take 1 tablet (100 mg total) by mouth See admin instructions. May repeat in 2 hours if headache persists or recurs.    Dispense:  9 tablet    Refill:  5     I discussed the assessment and treatment plan with the patient. The patient was provided an opportunity to ask questions and all were answered. The patient agreed with the plan and demonstrated an understanding of the instructions.   The patient was advised to call back or seek an in-person evaluation  if the symptoms worsen or if the condition fails to improve as anticipated.  I provided 25 minutes of non-face-to-face time during this encounter.   Penni Homans, MD

## 2019-06-18 ENCOUNTER — Other Ambulatory Visit: Payer: Self-pay

## 2019-06-18 ENCOUNTER — Other Ambulatory Visit (INDEPENDENT_AMBULATORY_CARE_PROVIDER_SITE_OTHER): Payer: Medicare HMO

## 2019-06-18 DIAGNOSIS — E785 Hyperlipidemia, unspecified: Secondary | ICD-10-CM | POA: Diagnosis not present

## 2019-06-18 DIAGNOSIS — R739 Hyperglycemia, unspecified: Secondary | ICD-10-CM

## 2019-06-18 LAB — COMPREHENSIVE METABOLIC PANEL
ALT: 27 U/L (ref 0–35)
AST: 21 U/L (ref 0–37)
Albumin: 4.5 g/dL (ref 3.5–5.2)
Alkaline Phosphatase: 82 U/L (ref 39–117)
BUN: 21 mg/dL (ref 6–23)
CO2: 29 mEq/L (ref 19–32)
Calcium: 9.9 mg/dL (ref 8.4–10.5)
Chloride: 102 mEq/L (ref 96–112)
Creatinine, Ser: 0.77 mg/dL (ref 0.40–1.20)
GFR: 74.07 mL/min (ref >60.00)
Glucose, Bld: 91 mg/dL (ref 70–99)
Potassium: 4.3 mEq/L (ref 3.5–5.1)
Sodium: 138 mEq/L (ref 135–145)
Total Bilirubin: 0.6 mg/dL (ref 0.2–1.2)
Total Protein: 7 g/dL (ref 6.0–8.3)

## 2019-06-18 LAB — CBC
HCT: 40.4 % (ref 36.0–46.0)
Hemoglobin: 13.9 g/dL (ref 12.0–15.0)
MCHC: 34.5 g/dL (ref 30.0–36.0)
MCV: 99.1 fl (ref 78.0–100.0)
Platelets: 282 10*3/uL (ref 150.0–400.0)
RBC: 4.07 Mil/uL (ref 3.87–5.11)
RDW: 12.6 % (ref 11.5–15.5)
WBC: 8.4 10*3/uL (ref 4.0–10.5)

## 2019-06-18 LAB — LIPID PANEL
Cholesterol: 167 mg/dL (ref 0–200)
HDL: 56.7 mg/dL (ref >39.00)
LDL Cholesterol: 94 mg/dL (ref 0–99)
NonHDL: 110.35
Total CHOL/HDL Ratio: 3
Triglycerides: 83 mg/dL (ref 0.0–149.0)
VLDL: 16.6 mg/dL (ref 0.0–40.0)

## 2019-06-18 LAB — HEMOGLOBIN A1C: Hgb A1c MFr Bld: 5.7 % (ref 4.6–6.5)

## 2019-06-18 LAB — TSH: TSH: 2.01 u[IU]/mL (ref 0.35–4.50)

## 2019-07-17 ENCOUNTER — Ambulatory Visit
Admission: RE | Admit: 2019-07-17 | Discharge: 2019-07-17 | Disposition: A | Discharge status: Home / Self Care | Payer: Medicare HMO | Source: Ambulatory Visit | Attending: Gynecology | Admitting: Gynecology

## 2019-07-17 ENCOUNTER — Other Ambulatory Visit: Payer: Self-pay

## 2019-07-17 DIAGNOSIS — Z1231 Encounter for screening mammogram for malignant neoplasm of breast: Secondary | ICD-10-CM | POA: Diagnosis not present

## 2019-07-24 ENCOUNTER — Encounter: Payer: Self-pay | Admitting: Gynecology

## 2019-08-28 ENCOUNTER — Telehealth: Payer: Self-pay | Admitting: Family Medicine

## 2019-08-28 NOTE — Telephone Encounter (Signed)
Patient declined AWV at this time. SF °

## 2019-10-15 DIAGNOSIS — Z20828 Contact with and (suspected) exposure to other viral communicable diseases: Secondary | ICD-10-CM | POA: Diagnosis not present

## 2019-11-20 DIAGNOSIS — R69 Illness, unspecified: Secondary | ICD-10-CM | POA: Diagnosis not present

## 2019-11-20 DIAGNOSIS — D631 Anemia in chronic kidney disease: Secondary | ICD-10-CM | POA: Diagnosis not present

## 2019-11-20 DIAGNOSIS — I517 Cardiomegaly: Secondary | ICD-10-CM | POA: Diagnosis not present

## 2019-12-17 ENCOUNTER — Encounter: Payer: Self-pay | Admitting: Family Medicine

## 2019-12-17 ENCOUNTER — Ambulatory Visit (INDEPENDENT_AMBULATORY_CARE_PROVIDER_SITE_OTHER): Payer: Medicare HMO | Admitting: Family Medicine

## 2019-12-17 ENCOUNTER — Other Ambulatory Visit: Payer: Self-pay

## 2019-12-17 DIAGNOSIS — G43809 Other migraine, not intractable, without status migrainosus: Secondary | ICD-10-CM | POA: Diagnosis not present

## 2019-12-17 DIAGNOSIS — E785 Hyperlipidemia, unspecified: Secondary | ICD-10-CM

## 2019-12-17 DIAGNOSIS — M8589 Other specified disorders of bone density and structure, multiple sites: Secondary | ICD-10-CM | POA: Diagnosis not present

## 2019-12-17 DIAGNOSIS — R739 Hyperglycemia, unspecified: Secondary | ICD-10-CM

## 2019-12-17 DIAGNOSIS — R7989 Other specified abnormal findings of blood chemistry: Secondary | ICD-10-CM | POA: Diagnosis not present

## 2019-12-17 LAB — LIPID PANEL
Cholesterol: 187 mg/dL (ref 0–200)
HDL: 61.7 mg/dL (ref >39.00)
LDL Cholesterol: 106 mg/dL — ABNORMAL HIGH (ref 0–99)
NonHDL: 125.1
Total CHOL/HDL Ratio: 3
Triglycerides: 98 mg/dL (ref 0.0–149.0)
VLDL: 19.6 mg/dL (ref 0.0–40.0)

## 2019-12-17 LAB — HEMOGLOBIN A1C: Hgb A1c MFr Bld: 5.5 % (ref 4.6–6.5)

## 2019-12-17 LAB — CBC
HCT: 42.5 % (ref 36.0–46.0)
Hemoglobin: 14.7 g/dL (ref 12.0–15.0)
MCHC: 34.6 g/dL (ref 30.0–36.0)
MCV: 97.9 fl (ref 78.0–100.0)
Platelets: 294 10*3/uL (ref 150.0–400.0)
RBC: 4.34 Mil/uL (ref 3.87–5.11)
RDW: 12.7 % (ref 11.5–15.5)
WBC: 7 10*3/uL (ref 4.0–10.5)

## 2019-12-17 LAB — COMPREHENSIVE METABOLIC PANEL
ALT: 46 U/L — ABNORMAL HIGH (ref 0–35)
AST: 34 U/L (ref 0–37)
Albumin: 4.5 g/dL (ref 3.5–5.2)
Alkaline Phosphatase: 96 U/L (ref 39–117)
BUN: 16 mg/dL (ref 6–23)
CO2: 29 mEq/L (ref 19–32)
Calcium: 10.6 mg/dL — ABNORMAL HIGH (ref 8.4–10.5)
Chloride: 100 mEq/L (ref 96–112)
Creatinine, Ser: 0.76 mg/dL (ref 0.40–1.20)
GFR: 75.09 mL/min (ref >60.00)
Glucose, Bld: 105 mg/dL — ABNORMAL HIGH (ref 70–99)
Potassium: 4.7 mEq/L (ref 3.5–5.1)
Sodium: 138 mEq/L (ref 135–145)
Total Bilirubin: 0.8 mg/dL (ref 0.2–1.2)
Total Protein: 7.4 g/dL (ref 6.0–8.3)

## 2019-12-17 LAB — TSH: TSH: 2.03 u[IU]/mL (ref 0.35–4.50)

## 2019-12-17 MED ORDER — SUMATRIPTAN SUCCINATE 100 MG PO TABS: 100.0000 mg | ORAL_TABLET | ORAL | 6 refills | Status: DC

## 2019-12-17 NOTE — Patient Instructions (Signed)
[employer] Blood Pressure cuff, upper arm, want BP 100-140/60-90 Pulse oximeter, want oxygen in 90s  Weekly vitals  Take Multivitamin with minerals, selenium Vitamin D 1000-2000 IU daily Probiotic with lactobacillus and bifidophilus Asprin EC 81 mg daily  Melatonin 2-5 mg at bedtime  [email or web address]  Magnesium Glycinate 400 mg at bedtime

## 2019-12-17 NOTE — Assessment & Plan Note (Signed)
Encouraged heart healthy diet, increase exercise, avoid trans fats, consider a krill oil cap daily 

## 2019-12-17 NOTE — Assessment & Plan Note (Signed)
hgba1c acceptable, minimize simple carbs. Increase exercise as tolerated.  

## 2019-12-17 NOTE — Assessment & Plan Note (Signed)
Encouraged increased hydration, 64 ounces of clear fluids daily. Minimize alcohol and caffeine. Eat small frequent meals with lean proteins and complex carbs. Avoid high and low blood sugars. Get adequate sleep, 7-8 hours a night. Needs exercise daily preferably in the morning.  

## 2019-12-17 NOTE — Assessment & Plan Note (Signed)
Encouraged to get adequate exercise, calcium and vitamin d intake 

## 2019-12-18 ENCOUNTER — Encounter: Payer: Self-pay | Admitting: Family Medicine

## 2019-12-18 ENCOUNTER — Other Ambulatory Visit (INDEPENDENT_AMBULATORY_CARE_PROVIDER_SITE_OTHER): Payer: Medicare HMO

## 2019-12-18 DIAGNOSIS — R7989 Other specified abnormal findings of blood chemistry: Secondary | ICD-10-CM | POA: Insufficient documentation

## 2019-12-18 LAB — VITAMIN D 25 HYDROXY (VIT D DEFICIENCY, FRACTURES): VITD: 52.89 ng/mL (ref 30.00–100.00)

## 2019-12-18 NOTE — Progress Notes (Signed)
Subjective:    Patient ID: Donna Guerra, female    DOB: [birth year], 69 y.o.   MRN: [id number]  Chief Complaint  Patient presents with  . Hyperglycemia    6 month follow up  . Hyperlipidemia  . Medication Refill    HPI Patient is in today for follow up on chronic medical concerns. No recent febrile illness or hospitalizations. 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 No acute concerns. She is eating well and staying active. Denies CP/palp/SOB/HA/congestion/fevers/GI or GU c/o. Taking meds as prescribed  Past Medical History:  Diagnosis Date  . Back pain 04/11/2017  . CIN I (cervical intraepithelial neoplasia I) 2005  . Colon polyps 07/08/2012  . Hyperglycemia 06/13/2017  . Hyperlipidemia 08/29/2012  . Migraines   . Osteopenia 03/2018   T score -1.6 FRAX 11% / 2.9% stable from prior DEXA  . Right shoulder pain 07/08/2012  . Skin cancer 07/08/2012   Basal cell    Past Surgical History:  Procedure Laterality Date  . ABDOMINAL SURGERY     Tummy tuck  . [place]  . CHOLECYSTECTOMY    . COLPOSCOPY    . COSMETIC SURGERY    . GYNECOLOGIC CRYOSURGERY    . OOPHORECTOMY  2002   BSO  . SHOULDER SURGERY     right rotator cuff repair  . TONSILLECTOMY AND ADENOIDECTOMY     age 53  . TUBAL LIGATION    . VAGINAL HYSTERECTOMY  2002   [facility] BSO    Family History  Problem Relation Age of Onset  . Hypertension Mother   . Breast cancer Mother 30  . Colon cancer Mother 6  . Breast cancer Maternal Aunt 80  . Breast cancer Maternal Aunt        50's  . Migraines Father   . Migraines Sister   . Cancer Sister        Melanoma  . Diabetes Maternal Grandmother   . Breast cancer Paternal Aunt        dx over 59  . Breast cancer Sister 26  . Cirrhosis Sister   . Breast cancer Other 61       DCIS - niece  . Prostate cancer Cousin        maternal first cousin  . Colon cancer Cousin        maternal first cousin  . Cancer Cousin         unknown cancer  . Breast cancer Cousin 73       TN breast cancer - maternal first cousin    Social History   Socioeconomic History  . Marital status: Married    Spouse name: Not on file  . Number of children: Not on file  . Years of education: Not on file  . Highest education level: Not on file  Occupational History  . Not on file  Tobacco Use  . Smoking status: Never Smoker  . Smokeless tobacco: Never Used  Substance and Sexual Activity  . Alcohol use: Yes    Alcohol/week: 0.0 standard drinks    Comment: occasionally  . Drug use: No  . Sexual activity: Yes    Partners: Male    Birth control/protection: Surgical    Comment: HYST-1st intercourse 69 yo-Fewer than 5 partners  Other Topics Concern  . Not on file  Social History Narrative  . Not on file   Social Determinants of Health   Financial Resource Strain:   .  Difficulty of Paying Living Expenses: Not on file  Food Insecurity:   . Worried About Charity fundraiser in the Last Year: Not on file  . Ran Out of Food in the Last Year: Not on file  Transportation Needs:   . Lack of Transportation (Medical): Not on file  . Lack of Transportation (Non-Medical): Not on file  Physical Activity:   . Days of Exercise per Week: Not on file  . Minutes of Exercise per Session: Not on file  Stress:   . Feeling of Stress : Not on file  Social Connections:   . Frequency of Communication with Friends and Family: Not on file  . Frequency of Social Gatherings with Friends and Family: Not on file  . Attends Religious Services: Not on file  . Active Member of Clubs or Organizations: Not on file  . Attends Archivist Meetings: Not on file  . Marital Status: Not on file  Intimate Partner Violence:   . Fear of Current or Ex-Partner: Not on file  . Emotionally Abused: Not on file  . Physically Abused: Not on file  . Sexually Abused: Not on file    Outpatient Medications Prior to Visit  Medication Sig Dispense Refill    . atorvastatin (LIPITOR) 40 MG tablet Take 1 tablet (40 mg total) by mouth daily. 90 tablet 3  . Cholecalciferol (VITAMIN D PO) Take 2,000 Units by mouth.    . cyanocobalamin 1000 MCG tablet Take 1,000 mcg by mouth daily.    Marland Kitchen estradiol (ESTRACE) 1 MG tablet Take 1/2 (one-half) tablet by mouth once daily 30 tablet 6  . fish oil-omega-3 fatty acids 1000 MG capsule Take 1 g by mouth daily.    . NONFORMULARY OR COMPOUNDED ITEM Estradiol vaginal 0.02% cream insert twice weekly vaginally 90 each 3  . SUMAtriptan (IMITREX) 100 MG tablet Take 1 tablet (100 mg total) by mouth See admin instructions. May repeat in 2 hours if headache persists or recurs. 9 tablet 5  . Cyanocobalamin (VITAMIN B-12 PO) Take by mouth daily. Take 2500 daily     No facility-administered medications prior to visit.    No Known Allergies  Review of Systems  Constitutional: Negative for fever and malaise/fatigue.  HENT: Negative for congestion.   Eyes: Negative for blurred vision.  Respiratory: Negative for shortness of breath.   Cardiovascular: Negative for chest pain, palpitations and leg swelling.  Gastrointestinal: Negative for abdominal pain, blood in stool and nausea.  Genitourinary: Negative for dysuria and frequency.  Musculoskeletal: Negative for falls.  Skin: Negative for rash.  Neurological: Positive for headaches. Negative for dizziness and loss of consciousness.  Endo/Heme/Allergies: Negative for environmental allergies.  Psychiatric/Behavioral: Negative for depression. The patient is not nervous/anxious.        Objective:    Physical Exam Vitals and nursing note reviewed.  Constitutional:      General: She is not in acute distress.    Appearance: She is well-developed.  HENT:     Head: Normocephalic and atraumatic.     Nose: Nose normal.  Eyes:     General:        Right eye: No discharge.        Left eye: No discharge.  Cardiovascular:     Rate and Rhythm: Normal rate and regular rhythm.      Heart sounds: No murmur.  Pulmonary:     Effort: Pulmonary effort is normal.     Breath sounds: Normal breath sounds.  Abdominal:  General: Bowel sounds are normal.     Palpations: Abdomen is soft.     Tenderness: There is no abdominal tenderness.  Musculoskeletal:     Cervical back: Normal range of motion and neck supple.  Skin:    General: Skin is warm and dry.  Neurological:     Mental Status: She is alert and oriented to person, place, and time.     BP 112/62 (BP Location: Right Arm, Patient Position: Sitting, Cuff Size: Normal)   Pulse 69   Temp (!) 95.7 F (35.4 C) (Temporal)   Resp 16   Ht 5\' 3"  (1.6 m)   Wt 127 lb (57.6 kg)   SpO2 97%   BMI 22.50 kg/m  Wt Readings from Last 3 Encounters:  12/17/19 127 lb (57.6 kg)  06/10/19 124 lb (56.2 kg)  02/06/19 126 lb (57.2 kg)    Diabetic Foot Exam - Simple   No data filed     Lab Results  Component Value Date   WBC 7.0 12/17/2019   HGB 14.7 12/17/2019   HCT 42.5 12/17/2019   PLT 294.0 12/17/2019   GLUCOSE 105 (H) 12/17/2019   CHOL 187 12/17/2019   TRIG 98.0 12/17/2019   HDL 61.70 12/17/2019   LDLCALC 106 (H) 12/17/2019   ALT 46 (H) 12/17/2019   AST 34 12/17/2019   NA 138 12/17/2019   K 4.7 12/17/2019   CL 100 12/17/2019   CREATININE 0.76 12/17/2019   BUN 16 12/17/2019   CO2 29 12/17/2019   TSH 2.03 12/17/2019   HGBA1C 5.5 12/17/2019    Lab Results  Component Value Date   TSH 2.03 12/17/2019   Lab Results  Component Value Date   WBC 7.0 12/17/2019   HGB 14.7 12/17/2019   HCT 42.5 12/17/2019   MCV 97.9 12/17/2019   PLT 294.0 12/17/2019   Lab Results  Component Value Date   NA 138 12/17/2019   K 4.7 12/17/2019   CO2 29 12/17/2019   GLUCOSE 105 (H) 12/17/2019   BUN 16 12/17/2019   CREATININE 0.76 12/17/2019   BILITOT 0.8 12/17/2019   ALKPHOS 96 12/17/2019   AST 34 12/17/2019   ALT 46 (H) 12/17/2019   PROT 7.4 12/17/2019   ALBUMIN 4.5 12/17/2019   CALCIUM 10.6 (H) 12/17/2019   GFR  75.09 12/17/2019   Lab Results  Component Value Date   CHOL 187 12/17/2019   Lab Results  Component Value Date   HDL 61.70 12/17/2019   Lab Results  Component Value Date   LDLCALC 106 (H) 12/17/2019   Lab Results  Component Value Date   TRIG 98.0 12/17/2019   Lab Results  Component Value Date   CHOLHDL 3 12/17/2019   Lab Results  Component Value Date   HGBA1C 5.5 12/17/2019       Assessment & Plan:   Problem List Items Addressed This Visit    Osteopenia    Encouraged to get adequate exercise, calcium and vitamin d intake      Relevant Orders   Comprehensive metabolic panel (Completed)   Migraines    Encouraged increased hydration, 64 ounces of clear fluids daily. Minimize alcohol and caffeine. Eat small frequent meals with lean proteins and complex carbs. Avoid high and low blood sugars. Get adequate sleep, 7-8 hours a night. Needs exercise daily preferably in the morning.      Relevant Medications   SUMAtriptan (IMITREX) 100 MG tablet   Other Relevant Orders   CBC (Completed)   Comprehensive metabolic  panel (Completed)   TSH (Completed)   Hyperlipidemia    Encouraged heart healthy diet, increase exercise, avoid trans fats, consider a krill oil cap daily      Relevant Orders   Lipid panel (Completed)   Hyperglycemia    hgba1c acceptable, minimize simple carbs. Increase exercise as tolerated.       Relevant Orders   Comprehensive metabolic panel (Completed)   Hemoglobin A1c (Completed)   Elevated liver function tests    Mild. Likely related         I have discontinued Jariana B. Vallejo's Cyanocobalamin (VITAMIN B-12 PO). I am also having her maintain her fish oil-omega-3 fatty acids, Cholecalciferol (VITAMIN D PO), estradiol, NONFORMULARY OR COMPOUNDED ITEM, atorvastatin, cyanocobalamin, and SUMAtriptan.  Meds ordered this encounter  Medications  . SUMAtriptan (IMITREX) 100 MG tablet    Sig: Take 1 tablet (100 mg total) by mouth See admin  instructions. May repeat in 2 hours if headache persists or recurs.    Dispense:  9 tablet    Refill:  6     Penni Homans, MD

## 2019-12-18 NOTE — Assessment & Plan Note (Signed)
Mild. Likely related

## 2019-12-31 DIAGNOSIS — H524 Presbyopia: Secondary | ICD-10-CM | POA: Diagnosis not present

## 2019-12-31 DIAGNOSIS — Z0100 Encounter for examination of eyes and vision without abnormal findings: Secondary | ICD-10-CM | POA: Diagnosis not present

## 2020-02-10 ENCOUNTER — Other Ambulatory Visit: Payer: Self-pay

## 2020-02-11 ENCOUNTER — Encounter: Payer: Self-pay | Admitting: Obstetrics and Gynecology

## 2020-02-11 ENCOUNTER — Ambulatory Visit (INDEPENDENT_AMBULATORY_CARE_PROVIDER_SITE_OTHER): Payer: Medicare HMO | Admitting: Obstetrics and Gynecology

## 2020-02-11 VITALS — BP 118/76 | Ht 63.5 in | Wt 127.0 lb

## 2020-02-11 DIAGNOSIS — Z01419 Encounter for gynecological examination (general) (routine) without abnormal findings: Secondary | ICD-10-CM

## 2020-02-11 DIAGNOSIS — M8589 Other specified disorders of bone density and structure, multiple sites: Secondary | ICD-10-CM | POA: Diagnosis not present

## 2020-02-11 DIAGNOSIS — Z79890 Hormone replacement therapy (postmenopausal): Secondary | ICD-10-CM | POA: Diagnosis not present

## 2020-02-11 DIAGNOSIS — N952 Postmenopausal atrophic vaginitis: Secondary | ICD-10-CM | POA: Diagnosis not present

## 2020-02-11 MED ORDER — ESTRADIOL 0.5 MG PO TABS: 0.5000 mg | ORAL_TABLET | Freq: Every day | ORAL | 3 refills | Status: DC

## 2020-02-11 NOTE — Progress Notes (Signed)
Donna Guerra [id number]  SUBJECTIVE:  69 y.o. [id number] female here for an annual routine gynecologic exam. She has no gynecologic concerns.  She has tried to wean off of the estradiol before and not feel well so she would like to continue.  Current Outpatient Medications  Medication Sig Dispense Refill  . atorvastatin (LIPITOR) 40 MG tablet Take 1 tablet (40 mg total) by mouth daily. 90 tablet 3  . Cholecalciferol (VITAMIN D PO) Take 2,000 Units by mouth.    . cyanocobalamin 1000 MCG tablet Take 1,000 mcg by mouth daily.    Marland Kitchen estradiol (ESTRACE) 1 MG tablet Take 1/2 (one-half) tablet by mouth once daily 30 tablet 6  . fish oil-omega-3 fatty acids 1000 MG capsule Take 1 g by mouth daily.    . NONFORMULARY OR COMPOUNDED ITEM Estradiol vaginal 0.02% cream insert twice weekly vaginally 90 each 3  . SUMAtriptan (IMITREX) 100 MG tablet Take 1 tablet (100 mg total) by mouth See admin instructions. May repeat in 2 hours if headache persists or recurs. 9 tablet 6   No current facility-administered medications for this visit.   Allergies: Patient has no known allergies.  No LMP recorded. Patient has had a hysterectomy.  Past medical history,surgical history, problem list, medications, allergies, family history and social history were all reviewed and documented as reviewed in the EPIC chart.  ROS:  Feeling well. No dyspnea or chest pain on exertion.  No abdominal pain, change in bowel habits, black or bloody stools.  No urinary tract symptoms. GYN ROS: no abnormal bleeding, pelvic pain or discharge, no breast pain or new or enlarging lumps on self exam. No neurological complaints.   OBJECTIVE:  BP 118/76   Ht 5' 3.5" (1.613 m)   Wt 127 lb (57.6 kg)   BMI 22.14 kg/m  The patient appears well, alert, oriented x 3, in no distress. ENT normal.  Neck supple. No cervical or supraclavicular adenopathy or thyromegaly.  Lungs are clear, good air entry, no wheezes, rhonchi or rales. S1  and S2 normal, no murmurs, regular rate and rhythm.  Abdomen soft without tenderness, guarding, mass or organomegaly.  Neurological is normal, no focal findings.  BREAST EXAM: breasts appear normal, no suspicious masses, no skin or nipple changes or axillary nodes  PELVIC EXAM: VULVA: normal appearing vulva with no masses, tenderness or lesions, VAGINA: normal appearing vagina with normal color and discharge, no lesions, CERVIX: surgically absent, UTERUS: surgically absent, vaginal cuff normal, ADNEXA: no masses  Chaperone: [name] present during the examination  ASSESSMENT:  69 y.o. [id number] here for annual gynecologic exam  PLAN:   1. Postmenopausal. Prior LAVH BSO in 2002 for heavy periods and leiomyoma with simple hyperplasia and adenomyosis.  She is on HRT and continues on estradiol 0.5 mg daily with formulated vaginal estrogen cream twice weekly.  She tells me she has tried to wean off but had negative symptoms including vasomotor symptoms and mood changes.  She is reminded of the risks of being on estrogen including thrombotic disease such as heart attack, stroke, DVT, PE, in addition to slight increased risk of breast cancer.  She would like to continue at this point understanding these risks and I have refilled both with a 1 year supply. 2. Pap smear 2019.  No significant history of abnormal Pap smears.  We discussed the option to stop screening based on age criteria, but it sounds that she would like to continue, so her next Pap smear would be due  2022 following the current guidelines recommending the 3 year interval. 3. Mammogram 06/2019.  Normal breast exam today.  Continue annual mammography. 4. Colonoscopy 2016.  Recommended that she follow up at the recommended interval.   5. Osteopenia.  DEXA 2019.  T score -1.6, FRAX 11% / 2.9%.  This was stable from prior DEXA.  Supplementing with vitamin D and getting adequate calcium in her diet.  Next DEXA recommended this year so she plans  to schedule this and it is ordered. 6. Health maintenance.  No labs today as she normally has these completed with her primary care provider.   Return annually or sooner, prn.  Joseph Pierini MD 02/11/20

## 2020-02-12 ENCOUNTER — Telehealth: Payer: Self-pay | Admitting: *Deleted

## 2020-02-12 MED ORDER — ESTRADIOL 1 MG PO TABS: 0.5000 mg | ORAL_TABLET | Freq: Every day | ORAL | 3 refills | Status: DC

## 2020-02-12 NOTE — Telephone Encounter (Signed)
Patient was seen yesterday for annual exam Rx sent for estrace 0.5 mg tablet reports the cost is more with this dose verses estradiol 1 mg tablet ( she was cutting in half). She asked if you would prescribed the estradiol 1 mg dose and she will take half tablet daily. Please advise

## 2020-02-12 NOTE — Telephone Encounter (Signed)
The mysteries of insurance.  I sent her a new prescription for the 1 mg estradiol tablets so she can continue to cut them in half.  Thank you

## 2020-02-13 NOTE — Telephone Encounter (Signed)
Leave message Rx sent.

## 2020-02-20 DIAGNOSIS — L570 Actinic keratosis: Secondary | ICD-10-CM | POA: Diagnosis not present

## 2020-02-20 DIAGNOSIS — C4361 Malignant melanoma of right upper limb, including shoulder: Secondary | ICD-10-CM | POA: Diagnosis not present

## 2020-02-20 DIAGNOSIS — L820 Inflamed seborrheic keratosis: Secondary | ICD-10-CM | POA: Diagnosis not present

## 2020-02-20 DIAGNOSIS — D225 Melanocytic nevi of trunk: Secondary | ICD-10-CM | POA: Diagnosis not present

## 2020-02-20 DIAGNOSIS — Z85828 Personal history of other malignant neoplasm of skin: Secondary | ICD-10-CM | POA: Diagnosis not present

## 2020-02-20 DIAGNOSIS — L821 Other seborrheic keratosis: Secondary | ICD-10-CM | POA: Diagnosis not present

## 2020-02-20 DIAGNOSIS — D485 Neoplasm of uncertain behavior of skin: Secondary | ICD-10-CM | POA: Diagnosis not present

## 2020-02-27 DIAGNOSIS — Z85828 Personal history of other malignant neoplasm of skin: Secondary | ICD-10-CM | POA: Diagnosis not present

## 2020-02-27 DIAGNOSIS — Z85820 Personal history of malignant melanoma of skin: Secondary | ICD-10-CM | POA: Diagnosis not present

## 2020-02-27 DIAGNOSIS — C4361 Malignant melanoma of right upper limb, including shoulder: Secondary | ICD-10-CM | POA: Diagnosis not present

## 2020-02-27 DIAGNOSIS — D2261 Melanocytic nevi of right upper limb, including shoulder: Secondary | ICD-10-CM | POA: Diagnosis not present

## 2020-02-27 DIAGNOSIS — D0361 Melanoma in situ of right upper limb, including shoulder: Secondary | ICD-10-CM | POA: Diagnosis not present

## 2020-03-17 DIAGNOSIS — D0361 Melanoma in situ of right upper limb, including shoulder: Secondary | ICD-10-CM

## 2020-03-17 HISTORY — DX: Melanoma in situ of right upper limb, including shoulder: D03.61

## 2020-04-27 DIAGNOSIS — Z20822 Contact with and (suspected) exposure to covid-19: Secondary | ICD-10-CM | POA: Diagnosis not present

## 2020-04-27 DIAGNOSIS — Z03818 Encounter for observation for suspected exposure to other biological agents ruled out: Secondary | ICD-10-CM | POA: Diagnosis not present

## 2020-04-30 ENCOUNTER — Telehealth: Payer: Self-pay

## 2020-04-30 ENCOUNTER — Encounter: Payer: Self-pay | Admitting: Family Medicine

## 2020-04-30 ENCOUNTER — Telehealth (INDEPENDENT_AMBULATORY_CARE_PROVIDER_SITE_OTHER): Payer: Medicare HMO | Admitting: Family Medicine

## 2020-04-30 DIAGNOSIS — R059 Cough, unspecified: Secondary | ICD-10-CM

## 2020-04-30 DIAGNOSIS — R062 Wheezing: Secondary | ICD-10-CM

## 2020-04-30 DIAGNOSIS — R05 Cough: Secondary | ICD-10-CM

## 2020-04-30 DIAGNOSIS — R0981 Nasal congestion: Secondary | ICD-10-CM

## 2020-04-30 DIAGNOSIS — R519 Headache, unspecified: Secondary | ICD-10-CM

## 2020-04-30 MED ORDER — ALBUTEROL SULFATE HFA 108 (90 BASE) MCG/ACT IN AERS: 2.0000 | INHALATION_SPRAY | Freq: Four times a day (QID) | RESPIRATORY_TRACT | 0 refills | Status: DC | PRN

## 2020-04-30 MED ORDER — AMOXICILLIN-POT CLAVULANATE 875-125 MG PO TABS: 1.0000 | ORAL_TABLET | Freq: Two times a day (BID) | ORAL | 0 refills | Status: DC

## 2020-04-30 MED ORDER — BENZONATATE 100 MG PO CAPS: 100.0000 mg | ORAL_CAPSULE | Freq: Three times a day (TID) | ORAL | 0 refills | Status: DC | PRN

## 2020-04-30 NOTE — Progress Notes (Signed)
Virtual Visit via Telephone Note  I connected with Donna Guerra on [birth year] at 12:00 PM EDT by telephone and verified that I am speaking with the correct person using two identifiers.   I discussed the limitations, risks, security and privacy concerns of performing an evaluation and management service by telephone and the availability of in person appointments. I also discussed with the patient that there may be a patient responsible charge related to this service. The patient expressed understanding and agreed to proceed.  Location patient: home, [place] Location provider: work or home office Participants present for the call: patient, provider Patient did not have a visit in the prior 7 days to address this/these issue(s).   History of Present Illness:   Acute phone call for sinus issues: -symptoms started about 8-9 days ago -symptoms include sinus congestion, scratchy throat, cough, loss of taste and smell, sinus and ear pain and pressure - this is worsening now with upper teeth hurting, mucus is thick and white -pt did a rapid covid test 2 days ago and it was negative -she was around her sister who had a cold -she tried an OTC cold medication -started musinex also, but it didn't help much -feels mild wheezing in upper chest -she denies history of lung disease or smoking -she has been fully vaccinated for [place] with the pfizer vaccine -reports she always gets a prolonged cough and wheezing with colds  Observations/Objective: Patient sounds cheerful and well on the phone. I do not appreciate any SOB. Speech and thought processing are grossly intact. Patient reported vitals:  Assessment and Plan:  Facial pain  Nasal congestion  Cough  Wheeze  -we discussed possible serious and likely etiologies, options for evaluation and workup, limitations of telemedicine visit vs in person visit, treatment, treatment risks and precautions. Pt prefers to treat via telemedicine empirically  rather then risking or undertaking an in person visit at this moment. Suspect started as a VURI w/ possible developing sinusitis. She may also have an underlying undiagnosed mild intermittent asthma or propensity for bronchitis as reports wheezing and prolonged coughs with colds.  Opted for nasal saline and short course of nasal decongestant with Augmentin 875 bid x  7-10 days if worsening or if is not improving over the next few days. For the wheezing and cough trial of alb inhaler and tessalon. COVID unlikely given she and sister both had recent neg covid19 tests and are vaccinated. Patient agrees to seek prompt in person care if worsening, new symptoms arise, or if is not improving with treatment.  Follow Up Instructions:  I did not refer this patient for an OV in the next 24 hours for this/these issue(s).  I discussed the assessment and treatment plan with the patient. The patient was provided an opportunity to ask questions and all were answered. The patient agreed with the plan and demonstrated an understanding of the instructions.   The patient was advised to call back or seek an in-person evaluation if the symptoms worsen or if the condition fails to improve as anticipated.  I provided 24 minutes of non-face-to-face time during this encounter.   Lucretia Kern, DO

## 2020-04-30 NOTE — Telephone Encounter (Signed)
Patient assessed/treated by Dr. Maudie Mercury today.    [facility] Night - Client TELEPHONE ADVICE RECORD [facility] Patient Name: Donna Guerra Gender: Female DOB: January 16, 1951 Age: 69 Y 18 D Return Phone Number: [phone] (Primary) Address: City/State/Zip: [place] [address] Client [facility] Night - Client Client Site [facility] - Night Physician Penni Homans - MD Contact Type Call Who Is Calling Patient / Member / Family / Caregiver Call Type Triage / Clinical Relationship To Patient Self Return Phone Number [phone] (Primary) Chief Complaint Nasal Congestion Reason for Call Symptomatic / Request for Health Information Initial Comment caller states she's not been feeling well for about a week, Her COVID test came back negative but she's lost her taste and smell along with congestion. she been vaccinated but still thinks she may have covid as her spouse is immune compromised Translation No Nurse Assessment Nurse: Alveta Heimlich, RN, Rise Paganini Date/Time (Eastern Time): 04/30/2020 7:28:32 AM Confirm and document reason for call. If symptomatic, describe symptoms. ---Caller states she has congestion and sinus and loss of sense of smell and taste. It started on Saturday. She had a covid test at [facility] but they told her to follow up with her dr. No fever. Has the patient had close contact with a person known or suspected to have the novel coronavirus illness OR traveled / lives in area with major community spread (including international travel) in the last 14 days from the onset of symptoms? * If Asymptomatic, screen for exposure and travel within the last 14 days. ---No Does the patient have any new or worsening symptoms? ---Yes Will a triage be completed? ---Yes Related visit to physician within the last 2 weeks? ---No Does the PT have any chronic conditions? (i.e. diabetes, asthma, this includes High risk factors for  pregnancy, etc.) ---Yes List chronic conditions. ---high cholesterol Is this a behavioral health or substance abuse call? ---No Guidelines Guideline Title Affirmed Question Affirmed Notes Nurse Date/Time ([place] Time) COVID-19 - Diagnosed or Suspected [1] HIGH RISK patient (e.g., age > 34 years, diabetes, heart or lung Alveta Heimlich, RN, Rise Paganini 04/30/2020 7:31:10 AM PLEASE NOTE: All timestamps contained within this report are represented as [place] Standard Time. CONFIDENTIALTY NOTICE: This fax transmission is intended only for the addressee. It contains information that is legally privileged, confidential or otherwise protected from use or disclosure. If you are not the intended recipient, you are strictly prohibited from reviewing, disclosing, copying using or disseminating any of this information or taking any action in reliance on or regarding this information. If you have received this fax in error, please notify us immediately by telephone so that we can arrange for its return to [place]. Phone: [phone], Toll-Free: [phone], Fax: [phone] Page: 2 of 2 Call Id: [id number] Guidelines Guideline Title Affirmed Question Affirmed Notes Nurse Date/Time Eilene Ghazi Time) disease, weak immune system) AND [2] new or worsening symptoms Disp. Time Eilene Ghazi Time) Disposition Final User 04/30/2020 7:34:44 AM Paged On Call back to [facility], [place], Rise Paganini 04/30/2020 7:34:27 AM Call PCP Now Yes Alveta Heimlich, RN, Ali Lowe Disagree/Comply Comply Caller Understands Yes PreDisposition Call Doctor Care Advice Given Per Guideline * I'll page the on-call provider now. If you haven't heard from the provider (or me) within 30 minutes, call again. CARE ADVICE given per COVID-19 - DIAGNOSED OR SUSPECTED (Adult) guideline. CALL BACK IF: * You become worse. Comments User: Debby Bud, RN Date/Time Eilene Ghazi Time): 04/30/2020 7:39:03 AM Pt states she will call office at 8 am.for  a virtual  appointment. Paging DoctorName Phone DateTime Result/Outcome Message Type Notes Thersa Salt- MD [phone] 04/30/2020 7:34:44 AM Paged On Call Back to Call Center Doctor Paged Thersa Salt- MD 04/30/2020 7:37:36 AM Spoke with On Call - General Message Result Report given. Pt is to call office at 8 am to make a virtual visit appt.

## 2020-05-01 ENCOUNTER — Ambulatory Visit: Payer: Medicare HMO | Attending: Internal Medicine

## 2020-05-01 DIAGNOSIS — Z20822 Contact with and (suspected) exposure to covid-19: Secondary | ICD-10-CM | POA: Diagnosis not present

## 2020-05-02 LAB — NOVEL CORONAVIRUS, NAA: SARS-CoV-2, NAA: NOT DETECTED

## 2020-05-02 LAB — SARS-COV-2, NAA 2 DAY TAT

## 2020-05-20 DIAGNOSIS — R69 Illness, unspecified: Secondary | ICD-10-CM | POA: Diagnosis not present

## 2020-05-26 DIAGNOSIS — L570 Actinic keratosis: Secondary | ICD-10-CM | POA: Diagnosis not present

## 2020-05-26 DIAGNOSIS — D1801 Hemangioma of skin and subcutaneous tissue: Secondary | ICD-10-CM | POA: Diagnosis not present

## 2020-05-26 DIAGNOSIS — L820 Inflamed seborrheic keratosis: Secondary | ICD-10-CM | POA: Diagnosis not present

## 2020-05-26 DIAGNOSIS — Z85828 Personal history of other malignant neoplasm of skin: Secondary | ICD-10-CM | POA: Diagnosis not present

## 2020-05-26 DIAGNOSIS — Z85820 Personal history of malignant melanoma of skin: Secondary | ICD-10-CM | POA: Diagnosis not present

## 2020-05-26 DIAGNOSIS — D225 Melanocytic nevi of trunk: Secondary | ICD-10-CM | POA: Diagnosis not present

## 2020-05-26 DIAGNOSIS — L814 Other melanin hyperpigmentation: Secondary | ICD-10-CM | POA: Diagnosis not present

## 2020-05-26 DIAGNOSIS — L821 Other seborrheic keratosis: Secondary | ICD-10-CM | POA: Diagnosis not present

## 2020-06-03 DIAGNOSIS — R69 Illness, unspecified: Secondary | ICD-10-CM | POA: Diagnosis not present

## 2020-06-05 ENCOUNTER — Other Ambulatory Visit: Payer: Self-pay | Admitting: Obstetrics and Gynecology

## 2020-06-05 DIAGNOSIS — Z1231 Encounter for screening mammogram for malignant neoplasm of breast: Secondary | ICD-10-CM

## 2020-06-11 ENCOUNTER — Other Ambulatory Visit: Payer: Self-pay | Admitting: Family Medicine

## 2020-06-11 ENCOUNTER — Telehealth: Payer: Self-pay | Admitting: Family Medicine

## 2020-06-11 MED ORDER — POLYMYXIN B-TRIMETHOPRIM 10000-0.1 UNIT/ML-% OP SOLN: 2.0000 [drp] | OPHTHALMIC | 0 refills | Status: DC

## 2020-06-11 NOTE — Telephone Encounter (Signed)
I sent in polytrim. Ahe should apply warm compresses and then drops

## 2020-06-11 NOTE — Telephone Encounter (Signed)
Patient states her eye is running and that it red, she would like a medication sent to pharmacy on file, patient [place] . Patient states she would like a generic medication.  Patient states it looks like pink eye.Marland Kitchen   Please Advise

## 2020-06-12 NOTE — Telephone Encounter (Signed)
06/12/20 Lefted message RC/CMA

## 2020-06-15 NOTE — Telephone Encounter (Signed)
Pt. Has been notified and has picked up Rx and states she has noted great improvement, and following provider's instructions.

## 2020-07-17 ENCOUNTER — Other Ambulatory Visit: Payer: Self-pay

## 2020-07-17 ENCOUNTER — Ambulatory Visit
Admission: RE | Admit: 2020-07-17 | Discharge: 2020-07-17 | Disposition: A | Discharge status: Home / Self Care | Payer: Medicare HMO | Source: Ambulatory Visit | Attending: Obstetrics and Gynecology | Admitting: Obstetrics and Gynecology

## 2020-07-17 DIAGNOSIS — Z1231 Encounter for screening mammogram for malignant neoplasm of breast: Secondary | ICD-10-CM | POA: Diagnosis not present

## 2020-07-18 DIAGNOSIS — Z20822 Contact with and (suspected) exposure to covid-19: Secondary | ICD-10-CM | POA: Diagnosis not present

## 2020-07-21 LAB — HM MAMMOGRAPHY

## 2020-07-27 ENCOUNTER — Telehealth: Payer: Self-pay

## 2020-07-27 DIAGNOSIS — E785 Hyperlipidemia, unspecified: Secondary | ICD-10-CM

## 2020-07-27 DIAGNOSIS — G43809 Other migraine, not intractable, without status migrainosus: Secondary | ICD-10-CM

## 2020-07-27 MED ORDER — SUMATRIPTAN SUCCINATE 100 MG PO TABS: 100.0000 mg | ORAL_TABLET | ORAL | 6 refills | Status: DC

## 2020-07-27 MED ORDER — ATORVASTATIN CALCIUM 40 MG PO TABS: 40.0000 mg | ORAL_TABLET | Freq: Every day | ORAL | 3 refills | Status: DC

## 2020-07-27 NOTE — Telephone Encounter (Signed)
Both meds reordered

## 2020-07-27 NOTE — Telephone Encounter (Signed)
Pt called stating she needs refills (next month) on Sumatriptan and Atorvastatin.  [facility].  Pt is due for CPE, but limited availability on PCP's schedule.  Pt is scheduled for CPE on 11/09/20.

## 2020-08-25 DIAGNOSIS — R69 Illness, unspecified: Secondary | ICD-10-CM | POA: Diagnosis not present

## 2020-09-01 DIAGNOSIS — Z85820 Personal history of malignant melanoma of skin: Secondary | ICD-10-CM | POA: Diagnosis not present

## 2020-09-01 DIAGNOSIS — L720 Epidermal cyst: Secondary | ICD-10-CM | POA: Diagnosis not present

## 2020-09-01 DIAGNOSIS — L570 Actinic keratosis: Secondary | ICD-10-CM | POA: Diagnosis not present

## 2020-09-01 DIAGNOSIS — L821 Other seborrheic keratosis: Secondary | ICD-10-CM | POA: Diagnosis not present

## 2020-09-01 DIAGNOSIS — D1801 Hemangioma of skin and subcutaneous tissue: Secondary | ICD-10-CM | POA: Diagnosis not present

## 2020-09-01 DIAGNOSIS — L820 Inflamed seborrheic keratosis: Secondary | ICD-10-CM | POA: Diagnosis not present

## 2020-09-01 DIAGNOSIS — Z85828 Personal history of other malignant neoplasm of skin: Secondary | ICD-10-CM | POA: Diagnosis not present

## 2020-09-01 DIAGNOSIS — L918 Other hypertrophic disorders of the skin: Secondary | ICD-10-CM | POA: Diagnosis not present

## 2020-09-01 DIAGNOSIS — L814 Other melanin hyperpigmentation: Secondary | ICD-10-CM | POA: Diagnosis not present

## 2020-09-01 DIAGNOSIS — D2272 Melanocytic nevi of left lower limb, including hip: Secondary | ICD-10-CM | POA: Diagnosis not present

## 2020-09-03 ENCOUNTER — Other Ambulatory Visit: Payer: Self-pay

## 2020-09-03 MED ORDER — NONFORMULARY OR COMPOUNDED ITEM: 3 refills | Status: DC

## 2020-09-03 NOTE — Telephone Encounter (Signed)
Spoke with Dr. Delilah Shan at visit and refill was sent for compound Estradiol cream but was not sent to compound pharmacy. She needs it sent to [facility]. Per visit note I phoned in Rx w refills.

## 2020-10-05 DIAGNOSIS — Z20822 Contact with and (suspected) exposure to covid-19: Secondary | ICD-10-CM | POA: Diagnosis not present

## 2020-11-06 ENCOUNTER — Other Ambulatory Visit: Payer: Self-pay

## 2020-11-09 ENCOUNTER — Ambulatory Visit (INDEPENDENT_AMBULATORY_CARE_PROVIDER_SITE_OTHER): Payer: Medicare HMO | Admitting: Family Medicine

## 2020-11-09 ENCOUNTER — Ambulatory Visit (HOSPITAL_BASED_OUTPATIENT_CLINIC_OR_DEPARTMENT_OTHER)
Admission: RE | Admit: 2020-11-09 | Discharge: 2020-11-09 | Disposition: A | Discharge status: Home / Self Care | Payer: Medicare HMO | Source: Ambulatory Visit | Attending: Family Medicine | Admitting: Family Medicine

## 2020-11-09 ENCOUNTER — Other Ambulatory Visit: Payer: Self-pay

## 2020-11-09 DIAGNOSIS — G43809 Other migraine, not intractable, without status migrainosus: Secondary | ICD-10-CM

## 2020-11-09 DIAGNOSIS — M542 Cervicalgia: Secondary | ICD-10-CM

## 2020-11-09 DIAGNOSIS — R7989 Other specified abnormal findings of blood chemistry: Secondary | ICD-10-CM

## 2020-11-09 DIAGNOSIS — M8580 Other specified disorders of bone density and structure, unspecified site: Secondary | ICD-10-CM

## 2020-11-09 DIAGNOSIS — C439 Malignant melanoma of skin, unspecified: Secondary | ICD-10-CM | POA: Diagnosis not present

## 2020-11-09 DIAGNOSIS — C4490 Unspecified malignant neoplasm of skin, unspecified: Secondary | ICD-10-CM

## 2020-11-09 DIAGNOSIS — Z23 Encounter for immunization: Secondary | ICD-10-CM | POA: Diagnosis not present

## 2020-11-09 DIAGNOSIS — R739 Hyperglycemia, unspecified: Secondary | ICD-10-CM

## 2020-11-09 DIAGNOSIS — Z0000 Encounter for general adult medical examination without abnormal findings: Secondary | ICD-10-CM

## 2020-11-09 DIAGNOSIS — E785 Hyperlipidemia, unspecified: Secondary | ICD-10-CM | POA: Diagnosis not present

## 2020-11-09 MED ORDER — TIZANIDINE HCL 2 MG PO TABS: 1.0000 mg | ORAL_TABLET | Freq: Two times a day (BID) | ORAL | 0 refills | Status: DC | PRN

## 2020-11-09 NOTE — Patient Instructions (Addendum)
Shingrix is the new shingles shot, 2 shots over 2-6 months Preventive Care 72 Years and Older, Female Preventive care refers to lifestyle choices and visits with your health care provider that can promote health and wellness. This includes:  A yearly physical exam. This is also called an annual wellness visit.  Regular dental and eye exams.  Immunizations.  Screening for certain conditions.  Healthy lifestyle choices, such as: ? Eating a healthy diet. ? Getting regular exercise. ? Not using drugs or products that contain nicotine and tobacco. ? Limiting alcohol use. What can I expect for my preventive care visit? Physical exam Your health care provider will check your:  Height and weight. These may be used to calculate your BMI (body mass index). BMI is a measurement that tells if you are at a healthy weight.  Heart rate and blood pressure.  Body temperature.  Skin for abnormal spots. Counseling Your health care provider may ask you questions about your:  Past medical problems.  Family's medical history.  Alcohol, tobacco, and drug use.  Emotional well-being.  Home life and relationship well-being.  Sexual activity.  Diet, exercise, and sleep habits.  History of falls.  Memory and ability to understand (cognition).  Work and work Astronomer.  Pregnancy and menstrual history.  Access to firearms. What immunizations do I need? Vaccines are usually given at various ages, according to a schedule. Your health care provider will recommend vaccines for you based on your age, medical history, and lifestyle or other factors, such as travel or where you work.   What tests do I need? Blood tests  Lipid and cholesterol levels. These may be checked every 5 years, or more often depending on your overall health.  Hepatitis C test.  Hepatitis B test. Screening  Lung cancer screening. You may have this screening every year starting at age 72 if you have a  30-pack-year history of smoking and currently smoke or have quit within the past 15 years.  Colorectal cancer screening. ? All adults should have this screening starting at age 72 and continuing until age 18. ? Your health care provider may recommend screening at age 72 if you are at increased risk. ? You will have tests every 1-10 years, depending on your results and the type of screening test.  Diabetes screening. ? This is done by checking your blood sugar (glucose) after you have not eaten for a while (fasting). ? You may have this done every 1-3 years.  Mammogram. ? This may be done every 1-2 years. ? Talk with your health care provider about how often you should have regular mammograms.  Abdominal aortic aneurysm (AAA) screening. You may need this if you are a current or former smoker.  BRCA-related cancer screening. This may be done if you have a family history of breast, ovarian, tubal, or peritoneal cancers. Other tests  STD (sexually transmitted disease) testing, if you are at risk.  Bone density scan. This is done to screen for osteoporosis. You may have this done starting at age 72. Talk with your health care provider about your test results, treatment options, and if necessary, the need for more tests. Follow these instructions at home: Eating and drinking  Eat a diet that includes fresh fruits and vegetables, whole grains, lean protein, and low-fat dairy products. Limit your intake of foods with high amounts of sugar, saturated fats, and salt.  Take vitamin and mineral supplements as recommended by your health care provider.  Do not drink alcohol  if your health care provider tells you not to drink.  If you drink alcohol: ? Limit how much you have to 0-1 drink a day. ? Be aware of how much alcohol is in your drink. In the [place] one drink equals one 12 oz bottle of beer (355 mL), one 5 oz glass of wine (148 mL), or one 1 oz glass of hard liquor (44 mL).    Lifestyle  Take daily care of your teeth and gums. Brush your teeth every morning and night with fluoride toothpaste. Floss one time each day.  Stay active. Exercise for at least 30 minutes 5 or more days each week.  Do not use any products that contain nicotine or tobacco, such as cigarettes, e-cigarettes, and chewing tobacco. If you need help quitting, ask your health care provider.  Do not use drugs.  If you are sexually active, practice safe sex. Use a condom or other form of protection in order to prevent STIs (sexually transmitted infections).  Talk with your health care provider about taking a low-dose aspirin or statin.  Find healthy ways to cope with stress, such as: ? Meditation, yoga, or listening to music. ? Journaling. ? Talking to a trusted person. ? Spending time with friends and family. Safety  Always wear your seat belt while driving or riding in a vehicle.  Do not drive: ? If you have been drinking alcohol. Do not ride with someone who has been drinking. ? When you are tired or distracted. ? While texting.  Wear a helmet and other protective equipment during sports activities.  If you have firearms in your house, make sure you follow all gun safety procedures. What's next?  Visit your health care provider once a year for an annual wellness visit.  Ask your health care provider how often you should have your eyes and teeth checked.  Stay up to date on all vaccines. This information is not intended to replace advice given to you by your health care provider. Make sure you discuss any questions you have with your health care provider. Document Revised: 09/23/2020 Document Reviewed: 09/27/2018 Elsevier Patient Education  2021 [employer].

## 2020-11-09 NOTE — Assessment & Plan Note (Signed)
Right shoulder, s/p Mohs, sees Dr Fontaine No of derm every 3 months.

## 2020-11-10 ENCOUNTER — Other Ambulatory Visit: Payer: Self-pay | Admitting: *Deleted

## 2020-11-10 DIAGNOSIS — R748 Abnormal levels of other serum enzymes: Secondary | ICD-10-CM

## 2020-11-10 LAB — LIPID PANEL
Cholesterol: 153 mg/dL (ref 0–200)
HDL: 50.7 mg/dL (ref >39.00)
LDL Cholesterol: 82 mg/dL (ref 0–99)
NonHDL: 101.93
Total CHOL/HDL Ratio: 3
Triglycerides: 100 mg/dL (ref 0.0–149.0)
VLDL: 20 mg/dL (ref 0.0–40.0)

## 2020-11-10 LAB — TSH: TSH: 1.87 u[IU]/mL (ref 0.35–4.50)

## 2020-11-10 LAB — COMPREHENSIVE METABOLIC PANEL
ALT: 23 U/L (ref 0–35)
AST: 21 U/L (ref 0–37)
Albumin: 4.7 g/dL (ref 3.5–5.2)
Alkaline Phosphatase: 84 U/L (ref 39–117)
BUN: 16 mg/dL (ref 6–23)
CO2: 29 mEq/L (ref 19–32)
Calcium: 9.9 mg/dL (ref 8.4–10.5)
Chloride: 102 mEq/L (ref 96–112)
Creatinine, Ser: 0.74 mg/dL (ref 0.40–1.20)
GFR: 81.31 mL/min (ref >60.00)
Glucose, Bld: 91 mg/dL (ref 70–99)
Potassium: 4 mEq/L (ref 3.5–5.1)
Sodium: 138 mEq/L (ref 135–145)
Total Bilirubin: 1 mg/dL (ref 0.2–1.2)
Total Protein: 7 g/dL (ref 6.0–8.3)

## 2020-11-10 LAB — CBC WITH DIFFERENTIAL/PLATELET
Basophils Absolute: 0.1 10*3/uL (ref 0.0–0.1)
Basophils Relative: 1.3 % (ref 0.0–3.0)
Eosinophils Absolute: 0.2 10*3/uL (ref 0.0–0.7)
Eosinophils Relative: 2 % (ref 0.0–5.0)
HCT: 39.6 % (ref 36.0–46.0)
Hemoglobin: 13.7 g/dL (ref 12.0–15.0)
Lymphocytes Relative: 34.6 % (ref 12.0–46.0)
Lymphs Abs: 2.7 10*3/uL (ref 0.7–4.0)
MCHC: 34.4 g/dL (ref 30.0–36.0)
MCV: 95.2 fl (ref 78.0–100.0)
Monocytes Absolute: 0.5 10*3/uL (ref 0.1–1.0)
Monocytes Relative: 6.6 % (ref 3.0–12.0)
Neutro Abs: 4.4 10*3/uL (ref 1.4–7.7)
Neutrophils Relative %: 55.5 % (ref 43.0–77.0)
Platelets: 285 10*3/uL (ref 150.0–400.0)
RBC: 4.16 Mil/uL (ref 3.87–5.11)
RDW: 12.5 % (ref 11.5–15.5)
WBC: 7.8 10*3/uL (ref 4.0–10.5)

## 2020-11-10 LAB — VITAMIN D 25 HYDROXY (VIT D DEFICIENCY, FRACTURES): VITD: 45.6 ng/mL (ref 30.00–100.00)

## 2020-11-10 LAB — HEMOGLOBIN A1C: Hgb A1c MFr Bld: 5.8 % (ref 4.6–6.5)

## 2020-11-10 LAB — VITAMIN B12: Vitamin B-12: >1526 pg/mL — ABNORMAL HIGH (ref 211–911)

## 2020-11-11 DIAGNOSIS — M542 Cervicalgia: Secondary | ICD-10-CM | POA: Insufficient documentation

## 2020-11-11 NOTE — Progress Notes (Signed)
Subjective:    Patient ID: Donna Guerra, female    DOB: Jun 16, 1951, 70 y.o.   MRN: [id number]  No chief complaint on file.   HPI Patient is in today for annual preventative exam and follow up on chronic medical concerns. She has been staying active and maintaining a heart healthy diet. She is struggling with frequent neck pain and headaches. No recent injury, falls or trauma. No complaints of polyuria or polydipsia.   Past Medical History:  Diagnosis Date  . Back pain 04/11/2017  . CIN I (cervical intraepithelial neoplasia I) 2005  . Colon polyps 07/08/2012  . Hyperglycemia 06/13/2017  . Hyperlipidemia 08/29/2012  . Migraines   . Osteopenia 03/2018   T score -1.6 FRAX 11% / 2.9% stable from prior DEXA  . Right shoulder pain 07/08/2012  . Skin cancer 07/08/2012   Basal cell    Past Surgical History:  Procedure Laterality Date  . ABDOMINAL SURGERY     Tummy tuck  . [place]  . CHOLECYSTECTOMY    . COLPOSCOPY    . COSMETIC SURGERY    . GYNECOLOGIC CRYOSURGERY    . OOPHORECTOMY  2002   BSO  . SHOULDER SURGERY     right rotator cuff repair  . TONSILLECTOMY AND ADENOIDECTOMY     age 86  . TUBAL LIGATION    . VAGINAL HYSTERECTOMY  2002   LAVH BSO    Family History  Problem Relation Age of Onset  . Hypertension Mother   . Breast cancer Mother 92  . Colon cancer Mother 64  . Breast cancer Maternal Aunt 80  . Breast cancer Maternal Aunt        50's  . Migraines Father   . Migraines Sister   . Cancer Sister        Melanoma  . Diabetes Maternal Grandmother   . Breast cancer Paternal Aunt        dx over 16  . Breast cancer Sister 75  . Cirrhosis Sister   . Breast cancer Other 34       DCIS - niece  . Prostate cancer Cousin        maternal first cousin  . Colon cancer Cousin        maternal first cousin  . Cancer Cousin        unknown cancer  . Breast cancer Cousin 16       TN breast cancer - maternal first cousin    Social History    Socioeconomic History  . Marital status: Married    Spouse name: Not on file  . Number of children: Not on file  . Years of education: Not on file  . Highest education level: Not on file  Occupational History  . Not on file  Tobacco Use  . Smoking status: Never Smoker  . Smokeless tobacco: Never Used  Vaping Use  . Vaping Use: Never used  Substance and Sexual Activity  . Alcohol use: Yes    Alcohol/week: 0.0 standard drinks    Comment: occasionally  . Drug use: No  . Sexual activity: Yes    Partners: Male    Birth control/protection: Surgical    Comment: HYST-1st intercourse 70 yo-Fewer than 5 partners  Other Topics Concern  . Not on file  Social History Narrative  . Not on file   Social Determinants of Health   Financial Resource Strain: Not on file  Food Insecurity: Not on file  Transportation  Needs: Not on file  Physical Activity: Not on file  Stress: Not on file  Social Connections: Not on file  Intimate Partner Violence: Not on file    Outpatient Medications Prior to Visit  Medication Sig Dispense Refill  . atorvastatin (LIPITOR) 40 MG tablet Take 1 tablet (40 mg total) by mouth daily. 90 tablet 3  . benzonatate (TESSALON PERLES) 100 MG capsule Take 1 capsule (100 mg total) by mouth 3 (three) times daily as needed. 20 capsule 0  . Cholecalciferol (VITAMIN D PO) Take 2,000 Units by mouth.    . cyanocobalamin 1000 MCG tablet Take 1,000 mcg by mouth daily.    Marland Kitchen estradiol (ESTRACE) 1 MG tablet Take 0.5 tablets (0.5 mg total) by mouth daily. 45 tablet 3  . fish oil-omega-3 fatty acids 1000 MG capsule Take 1 g by mouth daily.    . NONFORMULARY OR COMPOUNDED ITEM Estradiol vaginal 0.02% cream (prefilled applicators) S:  Insert one gram intravaginally twice weekly. 24 each 3  . SUMAtriptan (IMITREX) 100 MG tablet Take 1 tablet (100 mg total) by mouth See admin instructions. May repeat in 2 hours if headache persists or recurs. 9 tablet 6  . trimethoprim-polymyxin b  (POLYTRIM) ophthalmic solution Place 2 drops into both eyes every 4 (four) hours. 10 mL 0  . albuterol (VENTOLIN HFA) 108 (90 Base) MCG/ACT inhaler Inhale 2 puffs into the lungs every 6 (six) hours as needed for wheezing. 8.5 g 0  . amoxicillin-clavulanate (AUGMENTIN) 875-125 MG tablet Take 1 tablet by mouth 2 (two) times daily. 20 tablet 0   No facility-administered medications prior to visit.    No Known Allergies  Review of Systems  Constitutional: Negative for chills, fever and malaise/fatigue.  HENT: Negative for congestion and hearing loss.   Eyes: Negative for discharge.  Respiratory: Negative for cough, sputum production and shortness of breath.   Cardiovascular: Negative for chest pain, palpitations and leg swelling.  Gastrointestinal: Negative for abdominal pain, blood in stool, constipation, diarrhea, heartburn, nausea and vomiting.  Genitourinary: Negative for dysuria, frequency, hematuria and urgency.  Musculoskeletal: Positive for neck pain. Negative for back pain, falls and myalgias.  Skin: Negative for rash.  Neurological: Positive for headaches. Negative for dizziness, sensory change, loss of consciousness and weakness.  Endo/Heme/Allergies: Negative for environmental allergies. Does not bruise/bleed easily.  Psychiatric/Behavioral: Negative for depression and suicidal ideas. The patient is not nervous/anxious and does not have insomnia.        Objective:    Physical Exam Constitutional:      General: She is not in acute distress.    Appearance: She is not diaphoretic.  HENT:     Head: Normocephalic and atraumatic.     Right Ear: External ear normal.     Left Ear: External ear normal.     Nose: Nose normal.     Mouth/Throat:     Mouth: Oropharynx is clear and moist.     Pharynx: No oropharyngeal exudate.  Eyes:     General: No scleral icterus.       Right eye: No discharge.        Left eye: No discharge.     Conjunctiva/sclera: Conjunctivae normal.      Pupils: Pupils are equal, round, and reactive to light.  Neck:     Thyroid: No thyromegaly.  Cardiovascular:     Rate and Rhythm: Normal rate and regular rhythm.     Pulses: Intact distal pulses.     Heart sounds: Normal heart sounds.  No murmur heard.   Pulmonary:     Effort: Pulmonary effort is normal. No respiratory distress.     Breath sounds: Normal breath sounds. No wheezing or rales.  Abdominal:     General: Bowel sounds are normal. There is no distension.     Palpations: Abdomen is soft. There is no mass.     Tenderness: There is no abdominal tenderness.  Musculoskeletal:        General: No tenderness or edema. Normal range of motion.     Cervical back: Normal range of motion and neck supple.  Lymphadenopathy:     Cervical: No cervical adenopathy.  Skin:    General: Skin is warm and dry.     Findings: No rash.  Neurological:     Mental Status: She is alert and oriented to person, place, and time.     Cranial Nerves: No cranial nerve deficit.     Coordination: Coordination normal.     Deep Tendon Reflexes: Reflexes are normal and symmetric. Reflexes normal.     There were no vitals taken for this visit. Wt Readings from Last 3 Encounters:  02/11/20 127 lb (57.6 kg)  12/17/19 127 lb (57.6 kg)  06/10/19 124 lb (56.2 kg)    Diabetic Foot Exam - Simple   No data filed    Lab Results  Component Value Date   WBC 7.8 11/09/2020   HGB 13.7 11/09/2020   HCT 39.6 11/09/2020   PLT 285.0 11/09/2020   GLUCOSE 91 11/09/2020   CHOL 153 11/09/2020   TRIG 100.0 11/09/2020   HDL 50.70 11/09/2020   LDLCALC 82 11/09/2020   ALT 23 11/09/2020   AST 21 11/09/2020   NA 138 11/09/2020   K 4.0 11/09/2020   CL 102 11/09/2020   CREATININE 0.74 11/09/2020   BUN 16 11/09/2020   CO2 29 11/09/2020   TSH 1.87 11/09/2020   HGBA1C 5.8 11/09/2020    Lab Results  Component Value Date   TSH 1.87 11/09/2020   Lab Results  Component Value Date   WBC 7.8 11/09/2020   HGB 13.7  11/09/2020   HCT 39.6 11/09/2020   MCV 95.2 11/09/2020   PLT 285.0 11/09/2020   Lab Results  Component Value Date   NA 138 11/09/2020   K 4.0 11/09/2020   CO2 29 11/09/2020   GLUCOSE 91 11/09/2020   BUN 16 11/09/2020   CREATININE 0.74 11/09/2020   BILITOT 1.0 11/09/2020   ALKPHOS 84 11/09/2020   AST 21 11/09/2020   ALT 23 11/09/2020   PROT 7.0 11/09/2020   ALBUMIN 4.7 11/09/2020   CALCIUM 9.9 11/09/2020   GFR 81.31 11/09/2020   Lab Results  Component Value Date   CHOL 153 11/09/2020   Lab Results  Component Value Date   HDL 50.70 11/09/2020   Lab Results  Component Value Date   LDLCALC 82 11/09/2020   Lab Results  Component Value Date   TRIG 100.0 11/09/2020   Lab Results  Component Value Date   CHOLHDL 3 11/09/2020   Lab Results  Component Value Date   HGBA1C 5.8 11/09/2020       Assessment & Plan:   Problem List Items Addressed This Visit    Osteopenia   Relevant Orders   TSH (Completed)   Migraines    Encouraged increased hydration, 64 ounces of clear fluids daily. Minimize alcohol and caffeine. Eat small frequent meals with lean proteins and complex carbs. Avoid high and low blood sugars. Get adequate sleep,  7-8 hours a night. Needs exercise daily preferably in the morning.      Relevant Medications   tiZANidine (ZANAFLEX) 2 MG tablet   Melanoma of skin (HCC)    Right shoulder, s/p Mohs, sees Dr Fontaine No of derm every 3 months.       Preventative health care - Primary    Patient encouraged to maintain heart healthy diet, regular exercise, adequate sleep. Consider daily probiotics. Take medications as prescribed. Follows with gynecology for paps, mammograms and dexa scans. Labs ordered and reviewed. Last colonoscopy 2019, Dr Paulita Fujita performed they will recall      Relevant Orders   TSH (Completed)   Vitamin B12 (Completed)   Hyperlipidemia   Relevant Orders   TSH (Completed)   Lipid panel (Completed)   Hyperglycemia    hgba1c  acceptable, minimize simple carbs. Increase exercise as tolerated.       Relevant Orders   Hemoglobin A1c (Completed)   TSH (Completed)   Elevated liver function tests   Relevant Orders   CBC with Differential/Platelet (Completed)   Comprehensive metabolic panel (Completed)   TSH (Completed)   Neck pain    Encouraged moist heat and gentle stretching as tolerated. May try NSAIDs and prescription meds as directed and report if symptoms worsen or seek immediate care. Xray shows some degenerative changes of neck and likely muscle spasm. Given rx for Tizanidine prn use. Consider massage, chiropractor or acupuncture.       Relevant Orders   CBC with Differential/Platelet (Completed)   DG Cervical Spine Complete (Completed)    Other Visit Diagnoses    Hypercalcemia       Relevant Orders   VITAMIN D 25 Hydroxy (Vit-D Deficiency, Fractures) (Completed)   Need for pneumococcal vaccination       Relevant Orders   Pneumococcal polysaccharide vaccine 23-valent greater than or equal to 2yo subcutaneous/IM (Completed)      I have discontinued Kanyia B. Callins's amoxicillin-clavulanate and albuterol. I am also having her start on tiZANidine. Additionally, I am having her maintain her fish oil-omega-3 fatty acids, Cholecalciferol (VITAMIN D PO), cyanocobalamin, estradiol, benzonatate, trimethoprim-polymyxin b, SUMAtriptan, atorvastatin, and NONFORMULARY OR COMPOUNDED ITEM.  Meds ordered this encounter  Medications  . tiZANidine (ZANAFLEX) 2 MG tablet    Sig: Take 0.5-2 tablets (1-4 mg total) by mouth 2 (two) times daily as needed for muscle spasms.    Dispense:  10 tablet    Refill:  0     Penni Homans, MD

## 2020-11-11 NOTE — Assessment & Plan Note (Signed)
hgba1c acceptable, minimize simple carbs. Increase exercise as tolerated.  

## 2020-11-11 NOTE — Assessment & Plan Note (Signed)
Encouraged increased hydration, 64 ounces of clear fluids daily. Minimize alcohol and caffeine. Eat small frequent meals with lean proteins and complex carbs. Avoid high and low blood sugars. Get adequate sleep, 7-8 hours a night. Needs exercise daily preferably in the morning.  

## 2020-11-11 NOTE — Assessment & Plan Note (Signed)
Patient encouraged to maintain heart healthy diet, regular exercise, adequate sleep. Consider daily probiotics. Take medications as prescribed. Follows with gynecology for paps, mammograms and dexa scans. Labs ordered and reviewed. Last colonoscopy 2019, Dr Paulita Fujita performed they will recall

## 2020-11-11 NOTE — Assessment & Plan Note (Signed)
Encouraged moist heat and gentle stretching as tolerated. May try NSAIDs and prescription meds as directed and report if symptoms worsen or seek immediate care. Xray shows some degenerative changes of neck and likely muscle spasm. Given rx for Tizanidine prn use. Consider massage, chiropractor or acupuncture.

## 2020-12-17 DIAGNOSIS — Z85828 Personal history of other malignant neoplasm of skin: Secondary | ICD-10-CM | POA: Diagnosis not present

## 2020-12-17 DIAGNOSIS — L820 Inflamed seborrheic keratosis: Secondary | ICD-10-CM | POA: Diagnosis not present

## 2020-12-17 DIAGNOSIS — D1801 Hemangioma of skin and subcutaneous tissue: Secondary | ICD-10-CM | POA: Diagnosis not present

## 2020-12-17 DIAGNOSIS — D2261 Melanocytic nevi of right upper limb, including shoulder: Secondary | ICD-10-CM | POA: Diagnosis not present

## 2020-12-17 DIAGNOSIS — L821 Other seborrheic keratosis: Secondary | ICD-10-CM | POA: Diagnosis not present

## 2020-12-17 DIAGNOSIS — D2272 Melanocytic nevi of left lower limb, including hip: Secondary | ICD-10-CM | POA: Diagnosis not present

## 2020-12-17 DIAGNOSIS — L570 Actinic keratosis: Secondary | ICD-10-CM | POA: Diagnosis not present

## 2020-12-17 DIAGNOSIS — Z85820 Personal history of malignant melanoma of skin: Secondary | ICD-10-CM | POA: Diagnosis not present

## 2020-12-17 DIAGNOSIS — L814 Other melanin hyperpigmentation: Secondary | ICD-10-CM | POA: Diagnosis not present

## 2021-02-25 DIAGNOSIS — D2272 Melanocytic nevi of left lower limb, including hip: Secondary | ICD-10-CM | POA: Diagnosis not present

## 2021-02-25 DIAGNOSIS — L820 Inflamed seborrheic keratosis: Secondary | ICD-10-CM | POA: Diagnosis not present

## 2021-02-25 DIAGNOSIS — Z85820 Personal history of malignant melanoma of skin: Secondary | ICD-10-CM | POA: Diagnosis not present

## 2021-02-25 DIAGNOSIS — L570 Actinic keratosis: Secondary | ICD-10-CM | POA: Diagnosis not present

## 2021-02-25 DIAGNOSIS — D2261 Melanocytic nevi of right upper limb, including shoulder: Secondary | ICD-10-CM | POA: Diagnosis not present

## 2021-02-25 DIAGNOSIS — L821 Other seborrheic keratosis: Secondary | ICD-10-CM | POA: Diagnosis not present

## 2021-02-25 DIAGNOSIS — Z419 Encounter for procedure for purposes other than remedying health state, unspecified: Secondary | ICD-10-CM | POA: Diagnosis not present

## 2021-02-25 DIAGNOSIS — D1801 Hemangioma of skin and subcutaneous tissue: Secondary | ICD-10-CM | POA: Diagnosis not present

## 2021-02-25 DIAGNOSIS — Z85828 Personal history of other malignant neoplasm of skin: Secondary | ICD-10-CM | POA: Diagnosis not present

## 2021-03-29 ENCOUNTER — Telehealth: Payer: Self-pay | Admitting: Family Medicine

## 2021-03-29 ENCOUNTER — Other Ambulatory Visit: Payer: Self-pay

## 2021-03-29 DIAGNOSIS — G43809 Other migraine, not intractable, without status migrainosus: Secondary | ICD-10-CM

## 2021-03-29 MED ORDER — SUMATRIPTAN SUCCINATE 100 MG PO TABS: 100.0000 mg | ORAL_TABLET | ORAL | 6 refills | Status: DC

## 2021-03-29 NOTE — Telephone Encounter (Signed)
Patient states she would like a 6 month to year prescription     Medication SUMAtriptan (IMITREX) 100 MG tablet [[id number]]     Has the patient contacted their pharmacy? no (If no, request that the patient contact the pharmacy for the refill.) (If yes, when and what did the pharmacy advise?)    Preferred Pharmacy (with phone number or street name): [place], [place] - [address] Phone:  [phone]  Fax:  [phone]        Agent: Please be advised that RX refills may take up to 3 business days. We ask that you follow-up with your pharmacy.

## 2021-03-29 NOTE — Telephone Encounter (Signed)
Medication sent.

## 2021-03-30 ENCOUNTER — Other Ambulatory Visit: Payer: Self-pay

## 2021-03-30 ENCOUNTER — Encounter: Payer: Self-pay | Admitting: Obstetrics & Gynecology

## 2021-03-30 ENCOUNTER — Ambulatory Visit (INDEPENDENT_AMBULATORY_CARE_PROVIDER_SITE_OTHER): Payer: Medicare HMO | Admitting: Obstetrics & Gynecology

## 2021-03-30 ENCOUNTER — Other Ambulatory Visit (HOSPITAL_COMMUNITY)
Admission: RE | Admit: 2021-03-30 | Discharge: 2021-03-30 | Disposition: A | Discharge status: Home / Self Care | Payer: Medicare HMO | Source: Ambulatory Visit | Attending: Obstetrics & Gynecology | Admitting: Obstetrics & Gynecology

## 2021-03-30 VITALS — BP 120/74 | Ht 63.0 in | Wt 125.0 lb

## 2021-03-30 DIAGNOSIS — M8589 Other specified disorders of bone density and structure, multiple sites: Secondary | ICD-10-CM | POA: Diagnosis not present

## 2021-03-30 DIAGNOSIS — Z1272 Encounter for screening for malignant neoplasm of vagina: Secondary | ICD-10-CM | POA: Diagnosis not present

## 2021-03-30 DIAGNOSIS — N952 Postmenopausal atrophic vaginitis: Secondary | ICD-10-CM | POA: Diagnosis not present

## 2021-03-30 DIAGNOSIS — Z01419 Encounter for gynecological examination (general) (routine) without abnormal findings: Secondary | ICD-10-CM | POA: Diagnosis not present

## 2021-03-30 DIAGNOSIS — Z79890 Hormone replacement therapy (postmenopausal): Secondary | ICD-10-CM | POA: Diagnosis not present

## 2021-03-30 DIAGNOSIS — Z90710 Acquired absence of both cervix and uterus: Secondary | ICD-10-CM

## 2021-03-30 MED ORDER — ESTRADIOL 0.5 MG PO TABS: 0.5000 mg | ORAL_TABLET | Freq: Every day | ORAL | 4 refills | Status: DC

## 2021-03-30 NOTE — Progress Notes (Signed)
Donna Guerra [birth year] [id number]   History:    70 y.o. G2P2L2  RP:  Established patient presenting for annual gyn exam   HPI: Postmenopausal. S/P LAVH BSO in 2002 for heavy periods and leiomyoma with simple hyperplasia and adenomyosis.  She is on HRT and continues on estradiol 0.5 mg daily with formulated vaginal estrogen cream twice weekly.  She tells me she has tried to wean off but had negative symptoms including vasomotor symptoms and mood changes.  Pap smear 2019.  No significant history of abnormal Pap smears.  BMI 22.14.  Breasts normal.  Mammogram 07/2020.  Colonoscopy 2016.  Osteopenia.  DEXA 2019.  T score -1.6, FRAX 11% / 2.9%. No labs today as she normally has these completed with her primary care provider.    Past medical history,surgical history, family history and social history were all reviewed and documented in the EPIC chart.  Gynecologic History No LMP recorded. Patient has had a hysterectomy.  Obstetric History OB History  Gravida Para Term Preterm AB Living  2 2 2     2   SAB IAB Ectopic Multiple Live Births               # Outcome Date GA Lbr Len/2nd Weight Sex Delivery Anes PTL Lv  2 Term           1 Term              ROS: A ROS was performed and pertinent positives and negatives are included in the history.  GENERAL: No fevers or chills. HEENT: No change in vision, no earache, sore throat or sinus congestion. NECK: No pain or stiffness. CARDIOVASCULAR: No chest pain or pressure. No palpitations. PULMONARY: No shortness of breath, cough or wheeze. GASTROINTESTINAL: No abdominal pain, nausea, vomiting or diarrhea, melena or bright red blood per rectum. GENITOURINARY: No urinary frequency, urgency, hesitancy or dysuria. MUSCULOSKELETAL: No joint or muscle pain, no back pain, no recent trauma. DERMATOLOGIC: No rash, no itching, no lesions. ENDOCRINE: No polyuria, polydipsia, no heat or cold intolerance. No recent change in weight. HEMATOLOGICAL: No anemia or  easy bruising or bleeding. NEUROLOGIC: No headache, seizures, numbness, tingling or weakness. PSYCHIATRIC: No depression, no loss of interest in normal activity or change in sleep pattern.     Exam:   BP 120/74   Ht 5\' 3"  (1.6 m)   Wt 125 lb (56.7 kg)   BMI 22.14 kg/m   Body mass index is 22.14 kg/m.  General appearance : Well developed well nourished female. No acute distress HEENT: Eyes: no retinal hemorrhage or exudates,  Neck supple, trachea midline, no carotid bruits, no thyroidmegaly Lungs: Clear to auscultation, no rhonchi or wheezes, or rib retractions  Heart: Regular rate and rhythm, no murmurs or gallops Breast:Examined in sitting and supine position were symmetrical in appearance, no palpable masses or tenderness,  no skin retraction, no nipple inversion, no nipple discharge, no skin discoloration, no axillary or supraclavicular lymphadenopathy Abdomen: no palpable masses or tenderness, no rebound or guarding Extremities: no edema or skin discoloration or tenderness  Pelvic: Vulva: Normal             Vagina: No gross lesions or discharge.  Pap reflex done.  Cervix/Uterus absent  Adnexa  Without masses or tenderness  Anus: Normal   Assessment/Plan:  70 y.o. female for annual exam   1. Encounter for Papanicolaou smear of vagina as part of routine gynecological examination Gynecologic exam status post LAVH and BSO.  Pap reflex done at the vaginal vault.  Breast exam normal.  Screening mammogram October 2021 was negative.  Colonoscopy 2016 will repeat now.  Health labs with family physician.  Good body mass index of 22.14.  Continue with fitness and healthy nutrition. - Cytology - PAP( [place])  2. S/P laparoscopic assisted vaginal hysterectomy (LAVH) with BSO  3. Postmenopausal hormone replacement therapy Well on estradiol 0.5 mg per mouth daily.  No contraindication to continue.  Prescription sent to pharmacy.  4. Postmenopausal atrophic vaginitis Well on  estradiol cream twice a week.  Prescription sent to pharmacy.  5. Osteopenia of multiple sites Osteopenia on bone density in 2019.  Time to repeat a bone density now.  Vitamin D supplements, calcium intake of 1.5 g/day total and weightbearing physical activities regularly. - DG Bone Density; Future  Other orders - estradiol (ESTRACE) 0.5 MG tablet; Take 1 tablet (0.5 mg total) by mouth daily.   Princess Bruins MD, 12:08 PM 03/30/2021

## 2021-03-31 ENCOUNTER — Telehealth: Payer: Self-pay | Admitting: *Deleted

## 2021-03-31 DIAGNOSIS — L821 Other seborrheic keratosis: Secondary | ICD-10-CM | POA: Diagnosis not present

## 2021-03-31 MED ORDER — NONFORMULARY OR COMPOUNDED ITEM: 3 refills | Status: DC

## 2021-03-31 NOTE — Telephone Encounter (Signed)
-----   Message from Princess Bruins, MD sent at 03/30/2021 12:39 PM EDT ----- Regarding: Compound Estradiol cream Please send represcription for Compound Estradiol cream 1/4 applicator twice a week vaginally.

## 2021-03-31 NOTE — Telephone Encounter (Signed)
Rx called into [facility].

## 2021-04-01 ENCOUNTER — Encounter: Payer: Self-pay | Admitting: Obstetrics & Gynecology

## 2021-04-02 LAB — CYTOLOGY - PAP: Diagnosis: NEGATIVE

## 2021-04-13 ENCOUNTER — Telehealth: Payer: Self-pay | Admitting: Genetic Counselor

## 2021-04-13 NOTE — Telephone Encounter (Signed)
Receivedgenetic counseling referral from Dr. Paulita Fujita. Donna Guerra has been scheduled for a mychart video visitw/Karen on 6/29 at 1pm.Verified pt has an active mychart acct.

## 2021-04-14 ENCOUNTER — Inpatient Hospital Stay: Payer: Medicare HMO | Attending: Genetic Counselor | Admitting: Genetic Counselor

## 2021-04-14 DIAGNOSIS — Z808 Family history of malignant neoplasm of other organs or systems: Secondary | ICD-10-CM

## 2021-04-14 DIAGNOSIS — Z800 Family history of malignant neoplasm of digestive organs: Secondary | ICD-10-CM | POA: Diagnosis not present

## 2021-04-14 DIAGNOSIS — Z8042 Family history of malignant neoplasm of prostate: Secondary | ICD-10-CM

## 2021-04-14 DIAGNOSIS — Z803 Family history of malignant neoplasm of breast: Secondary | ICD-10-CM

## 2021-04-14 DIAGNOSIS — Z1379 Encounter for other screening for genetic and chromosomal anomalies: Secondary | ICD-10-CM | POA: Diagnosis not present

## 2021-04-14 NOTE — Progress Notes (Signed)
HPI:  I connected with  Donna Guerra on 04/14/2021 at 1 PM EDT by MyChart video conference and verified that I am speaking with the correct person using two identifiers.   Patient location: Home Provider location: [facility]  Ms. Fiebig was previously seen in the [place] clinic due to a family history of cancer and concerns regarding a hereditary predisposition to cancer. Please refer to our prior cancer genetics clinic note for more information regarding our discussion, assessment and recommendations, at the time. Ms. Scroggs genetic test results were discussed with her, as were recommendations warranted by these results. These results and recommendations are discussed in more detail below.  CANCER HISTORY:  Oncology History   No history exists.    FAMILY HISTORY:  We obtained a detailed, 4-generation family history.  Significant diagnoses are listed below: Family History  Problem Relation Age of Onset   Hypertension Mother    Breast cancer Mother 43   Colon cancer Mother 60   Breast cancer Maternal Aunt 23   Breast cancer Maternal Aunt        55's   Migraines Father    Migraines Sister    Cancer Sister        Melanoma   Diabetes Maternal Grandmother    Breast cancer Paternal Aunt        dx over 25   Breast cancer Sister 29   Cirrhosis Sister    Breast cancer Other 19       DCIS - niece   Prostate cancer Cousin        maternal first cousin   Colon cancer Cousin        maternal first cousin   Cancer Cousin        unknown cancer   Breast cancer Cousin 63       TN breast cancer - maternal first cousin    The patient has two sons who are cancer free.  She has one sister and two brothers.  Her sister was diagnosed with melanoma at age 51. The patient also had one maternal half sister.  This sister developed breast cancer at 28 and died of liver cirrhosis at 35. This sister had five children, one who had breast cancer at 24.  This niece had genetic  testing and was found to have Lynch syndrome.  The report is not available today. Both parents are deceased.     The patient's mother was diagnosed with breast cancer at age 23.  This eventually became metastatic and spread to her colon at age 21.  She had two brothers and two sisters.  One brother did not have cancer but had three children with cancer - a son with prostate cancer, a son with colon cancer and a daughter with an unknown cancer.  One sister had breast cancer at 41 and died at 33.  This sister had one daughter who has TN breast cancer at age 13.  She had genetic testing that found a VUS but is otherwise negative.  The maternal grandparents are deceased from non cancer related issues.   The patient's father died of old age at 64.  He was one of 13 children.  He had one sister with breast cancer.  There is no other reported family history of cancer on that side of the family.   Patient's maternal ancestors are of [place] descent, and paternal ancestors are of Northern European descent. There is no reported Ashkenazi Jewish ancestry. There is  no known consanguinity.    GENETIC TEST RESULTS: We discussed her genetic testing that was performed in 2018.  She had a cousin who underwent genetic testing as well, and was found to have a variant of uncertain significance in AXIN2.  When testing was ordered, we specifically asked for this particular variant to be looked for, even though it was not considered pathogenic at the time.  Genetic testing reported out on August 03, 2017 through the Multi-cancer panel found no pathogenic mutations. The Multi-Gene Panel offered by [facility] includes sequencing and/or deletion duplication testing of the following 83 genes: ALK, APC, ATM, AXIN2,BAP1,  BARD1, BLM, BMPR1A, BRCA1, BRCA2, BRIP1, CASR, CDC73, CDH1, CDK4, CDKN1B, CDKN1C, CDKN2A (p14ARF), CDKN2A (p16INK4a), CEBPA, CHEK2, CTNNA1, DICER1, DIS3L2, EGFR (c.2369C>T, p.Thr790Met variant only), EPCAM  (Deletion/duplication testing only), FH, FLCN, GATA2, GPC3, GREM1 (Promoter region deletion/duplication testing only), HOXB13 (c.251G>A, p.Gly84Glu), HRAS, KIT, MAX, MEN1, MET, MITF (c.952G>A, p.Glu318Lys variant only), MLH1, MSH2, MSH3, MSH6, MUTYH, NBN, NF1, NF2, NTHL1, PALB2, PDGFRA, PHOX2B, PMS2, POLD1, POLE, POT1, PRKAR1A, PTCH1, PTEN, RAD50, RAD51C, RAD51D, RB1, RECQL4, RET, RUNX1, SDHAF2, SDHA (sequence changes only), SDHB, SDHC, SDHD, SMAD4, SMARCA4, SMARCB1, SMARCE1, STK11, SUFU, TERC, TERT, TMEM127, TP53, TSC1, TSC2, VHL, WRN and WT1. The test report has been scanned into EPIC and is located under the Molecular Pathology section of the Results Review tab.  A portion of the result report is included below for reference.     We discussed with Ms. Talamante that because current genetic testing is not perfect, it is possible there may be a gene mutation in one of these genes that current testing cannot detect, but that chance is small.  We also discussed, that there could be another gene that has not yet been discovered, or that we have not yet tested, that is responsible for the cancer diagnoses in the family. It is also possible there is a hereditary cause for the cancer in the family that Ms. Knarr did not inherit and therefore was not identified in her testing.  Therefore, it is important to remain in touch with cancer genetics in the future so that we can continue to offer Ms. Polyakov the most up to date genetic testing.   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  At that time, it was unknown if this variant was associated with increased cancer risk or if this was a normal finding, but we discussed that most variants such as this get reclassified to being inconsequential. In November 2019, this variant's classification was changed to "Likely Benign".  This means that we feel that this variant is most likely  a non-disease causing change.  Her genetic testing is now considered a normal test.    Of note, the familial AXIN2 VUS identified in the patient's cousin was NOT found in the patient.  She also reports that her half sisters' daughter was found to have Lynch syndrome.  We discussed that maybe the family members who have cancer have Lynch syndrome, but that she did not inherit that mutation.  ADDITIONAL GENETIC TESTING: We discussed with Ms. Endres that her genetic testing was fairly extensive.  If there are genes identified to increase cancer risk that can be analyzed in the future, we would be happy to discuss and coordinate this testing at that time.    CANCER SCREENING RECOMMENDATIONS: Ms. Renegar test result is considered negative (normal).  This means that we have not  identified a hereditary cause for her family history of cancer at this time. Most cancers happen by chance and this negative test suggests that her cancer may fall into this category.    While reassuring, this does not definitively rule out a hereditary predisposition to cancer. It is still possible that there could be genetic mutations that are undetectable by current technology. There could be genetic mutations in genes that have not been tested or identified to increase cancer risk.  Therefore, it is recommended she continue to follow the cancer management and screening guidelines provided by her primary healthcare provider.   An individual's cancer risk and medical management are not determined by genetic test results alone. Overall cancer risk assessment incorporates additional factors, including personal medical history, family history, and any available genetic information that may result in a personalized plan for cancer prevention and surveillance  RECOMMENDATIONS FOR FAMILY MEMBERS:  Individuals in this family might be at some increased risk of developing cancer, over the general population risk, simply due to the family  history of cancer, and the possible diagnosis of Lynch syndrome in the patient's half sister's daughter.  We recommended women in this family have a yearly mammogram beginning at age 37, or 75 years younger than the earliest onset of cancer, an annual clinical breast exam, and perform monthly breast self-exams. Women in this family should also have a gynecological exam as recommended by their primary provider. All family members should be tested for the niece's genetic mutation and referred for colonoscopy starting at age 31 or based on the results of the genetic testing for the reported Lynch syndrome in the family.  FOLLOW-UP: Lastly, we discussed with Ms. Violet that cancer genetics is a rapidly advancing field and it is possible that new genetic tests will be appropriate for her and/or her family members in the future. We encouraged her to remain in contact with cancer genetics on an annual basis so we can update her personal and family histories and let her know of advances in cancer genetics that may benefit this family.   Our contact number was provided. Ms. Elwell questions were answered to her satisfaction, and she knows she is welcome to call us at anytime with additional questions or concerns.   Roma Kayser, [place], [facility] Licensed, Certified Genetic Counselor [email or web address] phone: [phone]  The patient was seen for a total of 30 minutes in face-to-face genetic counseling.  The patient was seen alone.  This patient was discussed with Drs. Magrinat, Lindi Adie and/or Burr Medico who agrees with the above.    _______________________________________________________________________ For Office Staff:  Number of people involved in session: 1 Was an Intern/ student involved with case: no

## 2021-04-27 ENCOUNTER — Other Ambulatory Visit: Payer: Self-pay

## 2021-04-27 ENCOUNTER — Other Ambulatory Visit: Payer: Self-pay | Admitting: Obstetrics & Gynecology

## 2021-04-27 ENCOUNTER — Ambulatory Visit (INDEPENDENT_AMBULATORY_CARE_PROVIDER_SITE_OTHER): Payer: Medicare HMO

## 2021-04-27 DIAGNOSIS — M8589 Other specified disorders of bone density and structure, multiple sites: Secondary | ICD-10-CM

## 2021-04-27 DIAGNOSIS — Z780 Asymptomatic menopausal state: Secondary | ICD-10-CM

## 2021-07-01 ENCOUNTER — Other Ambulatory Visit: Payer: Self-pay | Admitting: Obstetrics and Gynecology

## 2021-07-01 ENCOUNTER — Other Ambulatory Visit: Payer: Self-pay | Admitting: Obstetrics & Gynecology

## 2021-07-01 DIAGNOSIS — Z1231 Encounter for screening mammogram for malignant neoplasm of breast: Secondary | ICD-10-CM

## 2021-07-19 ENCOUNTER — Ambulatory Visit: Payer: Medicare HMO

## 2021-08-10 DIAGNOSIS — R1310 Dysphagia, unspecified: Secondary | ICD-10-CM | POA: Diagnosis not present

## 2021-08-10 DIAGNOSIS — Z86010 Personal history of colonic polyps: Secondary | ICD-10-CM | POA: Diagnosis not present

## 2021-08-10 DIAGNOSIS — K319 Disease of stomach and duodenum, unspecified: Secondary | ICD-10-CM | POA: Diagnosis not present

## 2021-08-10 DIAGNOSIS — D123 Benign neoplasm of transverse colon: Secondary | ICD-10-CM | POA: Diagnosis not present

## 2021-08-10 DIAGNOSIS — K222 Esophageal obstruction: Secondary | ICD-10-CM | POA: Diagnosis not present

## 2021-08-10 LAB — HM COLONOSCOPY

## 2021-08-16 ENCOUNTER — Encounter: Payer: Self-pay | Admitting: Family

## 2021-08-16 ENCOUNTER — Other Ambulatory Visit: Payer: Self-pay

## 2021-08-16 ENCOUNTER — Ambulatory Visit (INDEPENDENT_AMBULATORY_CARE_PROVIDER_SITE_OTHER): Payer: Medicare HMO | Admitting: Family

## 2021-08-16 ENCOUNTER — Telehealth: Payer: Self-pay | Admitting: Family Medicine

## 2021-08-16 VITALS — BP 124/64 | HR 99 | Temp 98.0°F | Ht 63.5 in | Wt 125.4 lb

## 2021-08-16 DIAGNOSIS — R002 Palpitations: Secondary | ICD-10-CM | POA: Diagnosis not present

## 2021-08-16 DIAGNOSIS — K319 Disease of stomach and duodenum, unspecified: Secondary | ICD-10-CM | POA: Diagnosis not present

## 2021-08-16 DIAGNOSIS — D123 Benign neoplasm of transverse colon: Secondary | ICD-10-CM | POA: Diagnosis not present

## 2021-08-16 NOTE — Progress Notes (Signed)
Subjective:     Patient ID: Donna Guerra, female    DOB: 1951-05-21, 70 y.o.   MRN: [id number]  Chief Complaint  Patient presents with   Atrial Fibrillation    States that for the last few weeks her heart has been racing with high pulse rate.  Used her husbands BP cuff this morning and it alerted that Afib     HPI: Palpitations: Patient complains of palpitations and rapid heart beat.  The symptoms are of mild severity, occuring intermittently and lasting 2 minutes per episode. Cardiac risk factors include: advanced age (older than 65 for men, 5 for women). Aggravating factors: caffeine, stress/anxiety, possibly use of Imitrex over the last few days . Relieving factors: spontaneous. Associated signs and symptoms: none.    Health Maintenance Due  Topic Date Due   Zoster Vaccines- Shingrix (1 of 2) Never done   COVID-19 Vaccine (3 - Pfizer risk series) 01/11/2020   TETANUS/TDAP  11/03/2020   INFLUENZA VACCINE  05/17/2021   Pneumonia Vaccine 61+ Years old (2 - PCV) 11/09/2021    Past Medical History:  Diagnosis Date   Back pain 04/11/2017   CIN I (cervical intraepithelial neoplasia I) 2005   Colon polyps 07/08/2012   Hyperglycemia 06/13/2017   Hyperlipidemia 08/29/2012   Melanoma in situ of right shoulder ([place]) 03/17/2020   Migraines    Osteopenia 03/2018   T score -1.6 FRAX 11% / 2.9% stable from prior DEXA   Right shoulder pain 07/08/2012   Skin cancer 07/08/2012   Basal cell    Past Surgical History:  Procedure Laterality Date   ABDOMINAL SURGERY     Tummy tuck   CESAREAN SECTION  1978 and [place] CRYOSURGERY     OOPHORECTOMY  2002   BSO   SHOULDER SURGERY     right rotator cuff repair   TONSILLECTOMY AND ADENOIDECTOMY     age 57   TUBAL LIGATION     VAGINAL HYSTERECTOMY  2002   [facility] BSO    Outpatient Medications Prior to Visit  Medication Sig Dispense Refill   atorvastatin (LIPITOR)  40 MG tablet Take 1 tablet (40 mg total) by mouth daily. 90 tablet 3   Cholecalciferol (VITAMIN D PO) Take 2,000 Units by mouth.     cyanocobalamin 1000 MCG tablet Take 1,000 mcg by mouth daily.     estradiol (ESTRACE) 0.5 MG tablet Take 1 tablet (0.5 mg total) by mouth daily. 90 tablet 4   fish oil-omega-3 fatty acids 1000 MG capsule Take 1 g by mouth daily.     NONFORMULARY OR COMPOUNDED ITEM Estradiol vaginal 0.02% cream (prefilled applicators) S:  Insert one gram intravaginally twice weekly. 24 each 3   SUMAtriptan (IMITREX) 100 MG tablet Take 1 tablet (100 mg total) by mouth See admin instructions. May repeat in 2 hours if headache persists or recurs. 9 tablet 6   No facility-administered medications prior to visit.    No Known Allergies      Objective:    Physical Exam Vitals and nursing note reviewed.  Constitutional:      Appearance: Normal appearance.  Cardiovascular:     Rate and Rhythm: Normal rate. Rhythm irregular.  Pulmonary:     Effort: Pulmonary effort is normal.     Breath sounds: Normal breath sounds.  Musculoskeletal:        General: Normal range of motion.  Skin:    General: Skin is warm and dry.  Neurological:     Mental Status: She is alert.  Psychiatric:        Mood and Affect: Mood normal.        Behavior: Behavior normal.    BP 124/64   Pulse 99   Temp 98 F (36.7 C) (Temporal)   Ht 5' 3.5" (1.613 m)   Wt 125 lb 6.4 oz (56.9 kg)   SpO2 96%   BMI 21.87 kg/m  Wt Readings from Last 3 Encounters:  08/16/21 125 lb 6.4 oz (56.9 kg)  03/30/21 125 lb (56.7 kg)  02/11/20 127 lb (57.6 kg)       Assessment & Plan:   Problem List Items Addressed This Visit       Other   Heart palpitations - Primary    Pt used BP monitor at home and indicated A-fib. Reports having migraine last few days which she takes 1 Imitrex/day. Office ECG with frequent PACs. Dr. Jerline Pain consulted for confirmation. Advised pt on monitoring her caffeine intake, proper  hydration, sleep, get back into regular exercise routine. Call her PCP if sx are continuing and/or worsening with new symptoms after applying above lifestyle changes.      Relevant Orders   EKG 12-Lead (Completed)    No orders of the defined types were placed in this encounter.

## 2021-08-16 NOTE — Assessment & Plan Note (Addendum)
Pt used BP monitor at home and indicated A-fib. Reports having migraine last few days which she takes 1 Imitrex/day. Office ECG with frequent PACs. Dr. Jerline Pain consulted for confirmation. Advised pt on monitoring her caffeine intake, proper hydration, sleep, get back into regular exercise routine. Call her PCP if sx are continuing and/or worsening with new symptoms after applying above lifestyle changes.

## 2021-08-16 NOTE — Patient Instructions (Addendum)
It was very nice to see you today!  As discussed, be sure to hydrate with 64oz water daily, do not skip meals, monitor your caffeine intake and decrease you stress as much as possible with exercise, yoga, meditation, etc.  Let your PCP know if your symptoms are worsening with dizziness, fatigue, etc.   PLEASE NOTE:  If you had any lab tests please let us know if you have not heard back within a few days. You may see your results on mychart before we have a chance to review them but we will give you a call once they are reviewed by [place]. If we ordered any referrals today, please let us know if you have not heard from their office within the next week.   Please try these tips to maintain a healthy lifestyle:  Eat most of your calories during the day when you are active. Eliminate processed foods including packaged sweets (pies, cakes, cookies), reduce intake of potatoes, white bread, white pasta, and white rice. Look for whole grain options, oat flour or almond flour.  Each meal should contain half fruits/vegetables, one quarter protein, and one quarter carbs (no bigger than a computer mouse).  Cut down on sweet beverages. This includes juice, soda, and sweet tea. Also watch fruit intake, though this is a healthier sweet option, it still contains natural sugar! Limit to 3 servings daily.  Drink at least 1 glass of water with each meal and aim for at least 8 glasses per day  Exercise at least 150 minutes every week.

## 2021-08-16 NOTE — Telephone Encounter (Signed)
Pt stated she checked her blood pressure and it came up as afib and she felt very tired, she was transferred to triage.

## 2021-08-16 NOTE — Telephone Encounter (Signed)
Patient strongly encouraged to go to ER, refuses.  Scheduled at [facility] location today.   [facility] Day - ClientTELEPHONE ADVICE RECORDAccessNurse Patient Name:Donna Guerra [id number]([id number]) Chief Complaint Heart palpitations or irregular heartbeat Reason for Call Symptomatic / Request for [place] states that she has afib. It seems like it's skipping a beat every now and then. Translation No Nurse Assessment Nurse: Jearld Pies, RN, Lovena Le Date/Time Eilene Ghazi Time): 08/16/2021 10:13:51 AM Confirm and document reason for call. If symptomatic, describe symptoms. ---It seems like it's skipping a beat every now and then. Started a couple of weeks ago. On BP monitor states A fib. BP was green and good. Denies chest pain, SOB, or any other symptoms at this time. Does the patient have any new or worsening symptoms? ---Yes Will a triage be completed? ---Yes Related visit to physician within the last 2 weeks? ---No Does the PT have any chronic conditions? (i.e. diabetes, asthma, this includes High risk factors for pregnancy, etc.) ---No Is this a behavioral health or substance abuse call? ---No Guidelines Guideline Title Affirmed Question Affirmed Notes Nurse Date/Time [facility] Time)Heart Rate and Heartbeat QuestionsDizziness,lightheadedness, Jeanette Caprice, RN, Lovena Le 08/16/2021 10:15:31AM Disp. Time (EasternTime) Disposition Final User 08/16/2021 10:19:54 AM Go to ED Now Yes Jearld Pies, RN, Apolonio Schneiders Disagree/Comply Comply Caller Understands Yes PLEASE NOTE: All timestamps contained within this report are represented as [place] Standard Time. PreDisposition InappropriateToAsk Care Advice Given Per Guideline * Leave now. Drive carefully. * Go to the ED at ___________ [facility] NOW: * You need to be seen in the Emergency Department. * It is better and safer if another adult drives instead of you. ANOTHER  ADULT SHOULD DRIVE: Referrals GO TO FACILITY REFUSED [place]

## 2021-08-17 DIAGNOSIS — Z85828 Personal history of other malignant neoplasm of skin: Secondary | ICD-10-CM | POA: Diagnosis not present

## 2021-08-17 DIAGNOSIS — Z85820 Personal history of malignant melanoma of skin: Secondary | ICD-10-CM | POA: Diagnosis not present

## 2021-08-17 DIAGNOSIS — L814 Other melanin hyperpigmentation: Secondary | ICD-10-CM | POA: Diagnosis not present

## 2021-08-17 DIAGNOSIS — L820 Inflamed seborrheic keratosis: Secondary | ICD-10-CM | POA: Diagnosis not present

## 2021-08-17 DIAGNOSIS — L570 Actinic keratosis: Secondary | ICD-10-CM | POA: Diagnosis not present

## 2021-08-17 DIAGNOSIS — C44519 Basal cell carcinoma of skin of other part of trunk: Secondary | ICD-10-CM | POA: Diagnosis not present

## 2021-08-17 DIAGNOSIS — L821 Other seborrheic keratosis: Secondary | ICD-10-CM | POA: Diagnosis not present

## 2021-08-17 DIAGNOSIS — D485 Neoplasm of uncertain behavior of skin: Secondary | ICD-10-CM | POA: Diagnosis not present

## 2021-09-07 ENCOUNTER — Other Ambulatory Visit: Payer: Self-pay

## 2021-09-07 ENCOUNTER — Ambulatory Visit
Admission: RE | Admit: 2021-09-07 | Discharge: 2021-09-07 | Disposition: A | Discharge status: Home / Self Care | Payer: Medicare HMO | Source: Ambulatory Visit | Attending: Obstetrics & Gynecology | Admitting: Obstetrics & Gynecology

## 2021-09-07 DIAGNOSIS — Z1231 Encounter for screening mammogram for malignant neoplasm of breast: Secondary | ICD-10-CM

## 2021-10-21 ENCOUNTER — Other Ambulatory Visit: Payer: Self-pay | Admitting: Family Medicine

## 2021-10-21 DIAGNOSIS — G43809 Other migraine, not intractable, without status migrainosus: Secondary | ICD-10-CM

## 2021-10-27 ENCOUNTER — Encounter: Payer: Self-pay | Admitting: Family

## 2021-10-27 ENCOUNTER — Ambulatory Visit (INDEPENDENT_AMBULATORY_CARE_PROVIDER_SITE_OTHER): Payer: Medicare HMO | Admitting: Family

## 2021-10-27 VITALS — BP 125/72 | HR 68 | Temp 98.4°F | Resp 16 | Ht 63.5 in | Wt 127.0 lb

## 2021-10-27 DIAGNOSIS — E785 Hyperlipidemia, unspecified: Secondary | ICD-10-CM

## 2021-10-27 DIAGNOSIS — G43809 Other migraine, not intractable, without status migrainosus: Secondary | ICD-10-CM

## 2021-10-27 DIAGNOSIS — R739 Hyperglycemia, unspecified: Secondary | ICD-10-CM

## 2021-10-27 DIAGNOSIS — R748 Abnormal levels of other serum enzymes: Secondary | ICD-10-CM | POA: Diagnosis not present

## 2021-10-27 LAB — COMPREHENSIVE METABOLIC PANEL
ALT: 34 U/L (ref 0–35)
AST: 25 U/L (ref 0–37)
Albumin: 4.6 g/dL (ref 3.5–5.2)
Alkaline Phosphatase: 99 U/L (ref 39–117)
BUN: 16 mg/dL (ref 6–23)
CO2: 31 mEq/L (ref 19–32)
Calcium: 10.6 mg/dL — ABNORMAL HIGH (ref 8.4–10.5)
Chloride: 101 mEq/L (ref 96–112)
Creatinine, Ser: 0.76 mg/dL (ref 0.40–1.20)
GFR: 78.22 mL/min (ref >60.00)
Glucose, Bld: 104 mg/dL — ABNORMAL HIGH (ref 70–99)
Potassium: 4.4 mEq/L (ref 3.5–5.1)
Sodium: 139 mEq/L (ref 135–145)
Total Bilirubin: 0.9 mg/dL (ref 0.2–1.2)
Total Protein: 7 g/dL (ref 6.0–8.3)

## 2021-10-27 LAB — LIPID PANEL
Cholesterol: 169 mg/dL (ref 0–200)
HDL: 58 mg/dL (ref >39.00)
LDL Cholesterol: 93 mg/dL (ref 0–99)
NonHDL: 110.51
Total CHOL/HDL Ratio: 3
Triglycerides: 90 mg/dL (ref 0.0–149.0)
VLDL: 18 mg/dL (ref 0.0–40.0)

## 2021-10-27 LAB — VITAMIN B12: Vitamin B-12: >1550 pg/mL — ABNORMAL HIGH (ref 211–911)

## 2021-10-27 MED ORDER — ATORVASTATIN CALCIUM 40 MG PO TABS: 40.0000 mg | ORAL_TABLET | Freq: Every day | ORAL | 3 refills | Status: DC

## 2021-10-27 MED ORDER — SUMATRIPTAN SUCCINATE 100 MG PO TABS: ORAL_TABLET | ORAL | 0 refills | Status: DC

## 2021-10-27 NOTE — Progress Notes (Signed)
Subjective:   By signing my name below, I, Donna Guerra, attest that this documentation has been prepared under the direction and in the presence of Donna Alar NP. 10/27/2021    Patient ID: Donna Guerra, female    DOB: [birth year], 71 y.o.   MRN: [id number]  Chief Complaint  Patient presents with   Migraine    Here for follow up, needs med refill   Hyperlipidemia    Needs medication refill   Cough    Complains of productive cough    HPI Patient is in today for a office visit.   Congestion- She complains of congestion and chest congestion since Saturday. She has not taken a Covid-19 test and is not interested in taking a test during this visit.  Migraines- She reports her migraines started after age 38 due to working a stressful job. Her migraines typically start with pain in her neck and occur after she wakes up. She typically gets them 2x weekly. She taking 100 mg Imitrex to manage her symptoms. She reports they help reduce the pain.  Cholesterol- She is requesting a refill on 40 mg Lipitor daily PO.  Lab Results  Component Value Date   CHOL 169 10/27/2021   HDL 58.00 10/27/2021   LDLCALC 93 10/27/2021   TRIG 90.0 10/27/2021   CHOLHDL 3 10/27/2021   Blood sugar- She occasionally checks her blood sugar at home and reports her levels are stable.  Lab Results  Component Value Date   HGBA1C 5.8 11/09/2020   Immunizations- She does not have the flu vaccine this year and is not interested in receiving it during this visit.   Health Maintenance Due  Topic Date Due   Zoster Vaccines- Shingrix (1 of 2) Never done   COVID-19 Vaccine (3 - Pfizer risk series) 01/11/2020   TETANUS/TDAP  11/03/2020   INFLUENZA VACCINE  05/17/2021   Pneumonia Vaccine 4+ Years old (2 - PCV) 11/09/2021    Past Medical History:  Diagnosis Date   Back pain 04/11/2017   CIN I (cervical intraepithelial neoplasia I) 2005   Colon polyps 07/08/2012   Hyperglycemia 06/13/2017    Hyperlipidemia 08/29/2012   Melanoma in situ of right shoulder ([place]) 03/17/2020   Migraines    Osteopenia 03/2018   T score -1.6 FRAX 11% / 2.9% stable from prior DEXA   Right shoulder pain 07/08/2012   Skin cancer 07/08/2012   Basal cell    Past Surgical History:  Procedure Laterality Date   ABDOMINAL SURGERY     Tummy tuck   CESAREAN SECTION  1978 and [place] CRYOSURGERY     OOPHORECTOMY  2002   BSO   SHOULDER SURGERY     right rotator cuff repair   TONSILLECTOMY AND ADENOIDECTOMY     age 81   TUBAL LIGATION     VAGINAL HYSTERECTOMY  2002   [facility] BSO    Family History  Problem Relation Age of Onset   Hypertension Mother    Breast cancer Mother 13   Colon cancer Mother 2   Breast cancer Maternal Aunt 34   Breast cancer Maternal Aunt        75's   Migraines Father    Migraines Sister    Cancer Sister        Melanoma   Diabetes Maternal Grandmother    Breast cancer Paternal Aunt  dx over 80   Breast cancer Sister 75   Cirrhosis Sister    Breast cancer Other 36       DCIS - niece   Prostate cancer Cousin        maternal first cousin   Colon cancer Cousin        maternal first cousin   Cancer Cousin        unknown cancer   Breast cancer Cousin 68       TN breast cancer - maternal first cousin    Social History   Socioeconomic History   Marital status: Married    Spouse name: Not on file   Number of children: Not on file   Years of education: Not on file   Highest education level: Not on file  Occupational History   Not on file  Tobacco Use   Smoking status: Never   Smokeless tobacco: Never  Vaping Use   Vaping Use: Never used  Substance and Sexual Activity   Alcohol use: Yes    Comment: 2 drinks/month   Drug use: No   Sexual activity: Yes    Partners: Male    Birth control/protection: Surgical    Comment: HYST-1st intercourse 71 yo-Fewer than 5 partners  Other Topics  Concern   Not on file  Social History Narrative   Not on file   Social Determinants of Health   Financial Resource Strain: Not on file  Food Insecurity: Not on file  Transportation Needs: Not on file  Physical Activity: Not on file  Stress: Not on file  Social Connections: Not on file  Intimate Partner Violence: Not on file    Outpatient Medications Prior to Visit  Medication Sig Dispense Refill   Cholecalciferol (VITAMIN D PO) Take 2,000 Units by mouth.     cyanocobalamin 1000 MCG tablet Take 1,000 mcg by mouth daily.     estradiol (ESTRACE) 0.5 MG tablet Take 1 tablet (0.5 mg total) by mouth daily. 90 tablet 4   fish oil-omega-3 fatty acids 1000 MG capsule Take 1 g by mouth daily.     NONFORMULARY OR COMPOUNDED ITEM Estradiol vaginal 0.02% cream (prefilled applicators) S:  Insert one gram intravaginally twice weekly. 24 each 3   atorvastatin (LIPITOR) 40 MG tablet Take 1 tablet (40 mg total) by mouth daily. 90 tablet 3   SUMAtriptan (IMITREX) 100 MG tablet TAKE 1 TABLET BY MOUTH. MAY REPEAT IN 2 HOURS IF  HEADACHE PERSISTS OR RECURS 9 tablet 0   No facility-administered medications prior to visit.    No Known Allergies  Review of Systems  HENT:  Positive for congestion.   Respiratory:         (+)chest congestion      Objective:    Physical Exam Constitutional:      General: She is not in acute distress.    Appearance: Normal appearance. She is not ill-appearing.  HENT:     Head: Normocephalic and atraumatic.     Right Ear: External ear normal.     Left Ear: External ear normal.  Eyes:     Extraocular Movements: Extraocular movements intact.     Pupils: Pupils are equal, round, and reactive to light.  Cardiovascular:     Rate and Rhythm: Normal rate and regular rhythm.     Heart sounds: Normal heart sounds. No murmur heard.   No gallop.  Pulmonary:     Effort: Pulmonary effort is normal. No respiratory distress.     Breath  sounds: Normal breath sounds. No  wheezing or rales.  Skin:    General: Skin is warm and dry.  Neurological:     Mental Status: She is alert and oriented to person, place, and time.  Psychiatric:        Behavior: Behavior normal.    BP 125/72 (BP Location: Right Arm, Patient Position: Sitting, Cuff Size: Small)    Pulse 68    Temp 98.4 F (36.9 C) (Oral)    Resp 16    Ht 5' 3.5" (1.613 m)    Wt 127 lb (57.6 kg)    SpO2 99%    BMI 22.14 kg/m  Wt Readings from Last 3 Encounters:  10/27/21 127 lb (57.6 kg)  08/16/21 125 lb 6.4 oz (56.9 kg)  03/30/21 125 lb (56.7 kg)       Assessment & Plan:   Problem List Items Addressed This Visit       Unprioritized   Migraines   Relevant Medications   atorvastatin (LIPITOR) 40 MG tablet   SUMAtriptan (IMITREX) 100 MG tablet   Hyperlipidemia   Relevant Medications   atorvastatin (LIPITOR) 40 MG tablet   Other Relevant Orders   Lipid panel (Completed)   Comp Met (CMET) (Completed)   Hyperglycemia    A1C's have been normal over the last several years.       Other Visit Diagnoses     Elevated vitamin B12 level    -  Primary   Relevant Orders   B12 (Completed)        Meds ordered this encounter  Medications   atorvastatin (LIPITOR) 40 MG tablet    Sig: Take 1 tablet (40 mg total) by mouth daily.    Dispense:  90 tablet    Refill:  3   SUMAtriptan (IMITREX) 100 MG tablet    Sig: TAKE 1 TABLET BY MOUTH. MAY REPEAT IN 2 HOURS IF  HEADACHE PERSISTS OR RECURS    Dispense:  9 tablet    Refill:  0    NEEDS APPOINTMENT FOR FURTHER REFILLS.    I, Donna Alar NP, personally preformed the services described in this documentation.  All medical record entries made by the scribe were at my direction and in my presence.  I have reviewed the chart and discharge instructions (if applicable) and agree that the record reflects my personal performance and is accurate and complete. 10/27/2021   I,Donna Guerra,acting as a Education administrator for Nance Pear, NP.,have  documented all relevant documentation on the behalf of Nance Pear, NP,as directed by  Nance Pear, NP while in the presence of Nance Pear, NP.   Nance Pear, NP

## 2021-10-27 NOTE — Assessment & Plan Note (Signed)
A1C's have been normal over the last several years.

## 2021-10-27 NOTE — Patient Instructions (Signed)
Please complete lab work prior to leaving. You may use mucinex as needed for chest congestion and delsym as needed for cough. Call if symptoms worsen or if not improved in 1 week.

## 2021-10-28 DIAGNOSIS — Z85820 Personal history of malignant melanoma of skin: Secondary | ICD-10-CM | POA: Diagnosis not present

## 2021-10-28 DIAGNOSIS — L570 Actinic keratosis: Secondary | ICD-10-CM | POA: Diagnosis not present

## 2021-10-28 DIAGNOSIS — D485 Neoplasm of uncertain behavior of skin: Secondary | ICD-10-CM | POA: Diagnosis not present

## 2021-11-15 ENCOUNTER — Telehealth: Payer: Self-pay | Admitting: Family Medicine

## 2021-11-15 DIAGNOSIS — E785 Hyperlipidemia, unspecified: Secondary | ICD-10-CM

## 2021-11-15 DIAGNOSIS — G43809 Other migraine, not intractable, without status migrainosus: Secondary | ICD-10-CM

## 2021-11-15 MED ORDER — SUMATRIPTAN SUCCINATE 100 MG PO TABS: ORAL_TABLET | ORAL | 3 refills | Status: DC

## 2021-11-15 NOTE — Telephone Encounter (Signed)
Patient would like to know if she could have enough refills for her atorvastatin and Sumatripan to hold her over until her CPE with Charlett Blake on 07/18.

## 2021-11-15 NOTE — Telephone Encounter (Signed)
Refills of Sumatripan sent to pharmacy. Patient had Atorvastatin #90 x 3 refills sent in on 10/27/21.

## 2022-02-04 IMAGING — DX DG CERVICAL SPINE COMPLETE 4+V
6 series · 6 of 6 positions shown · non-contrast
Comparison: None.

CLINICAL DATA: Neck pain and headaches.

EXAM:
CERVICAL SPINE - COMPLETE 4+ VIEW

[c-spine lat]
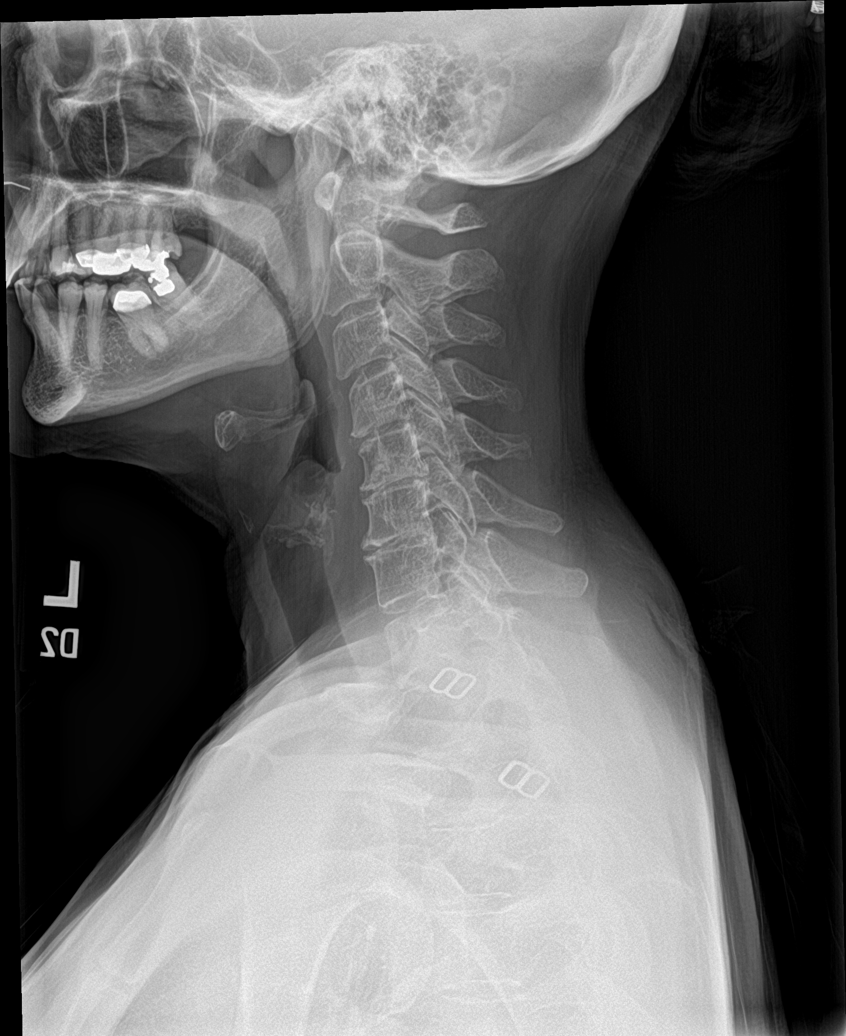

[c-spine obl (1 of 2)]
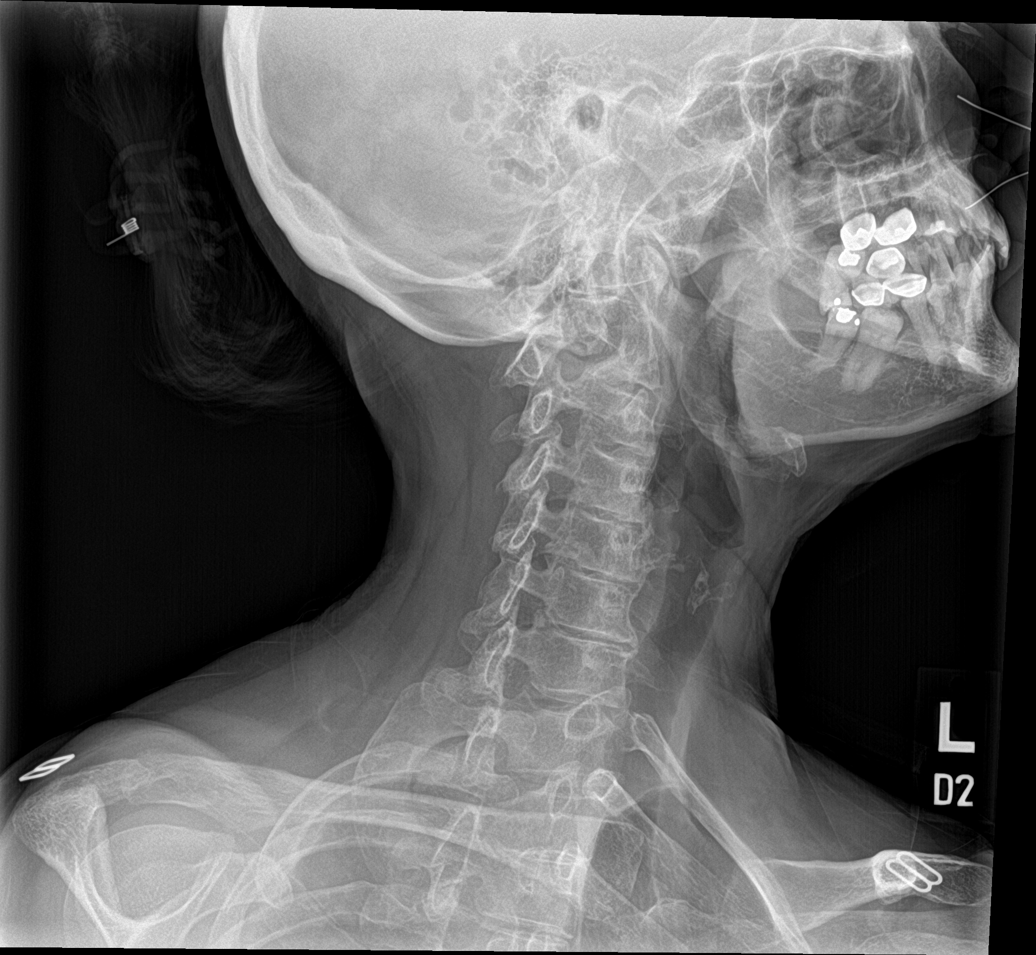

[c-spine obl (2 of 2)]
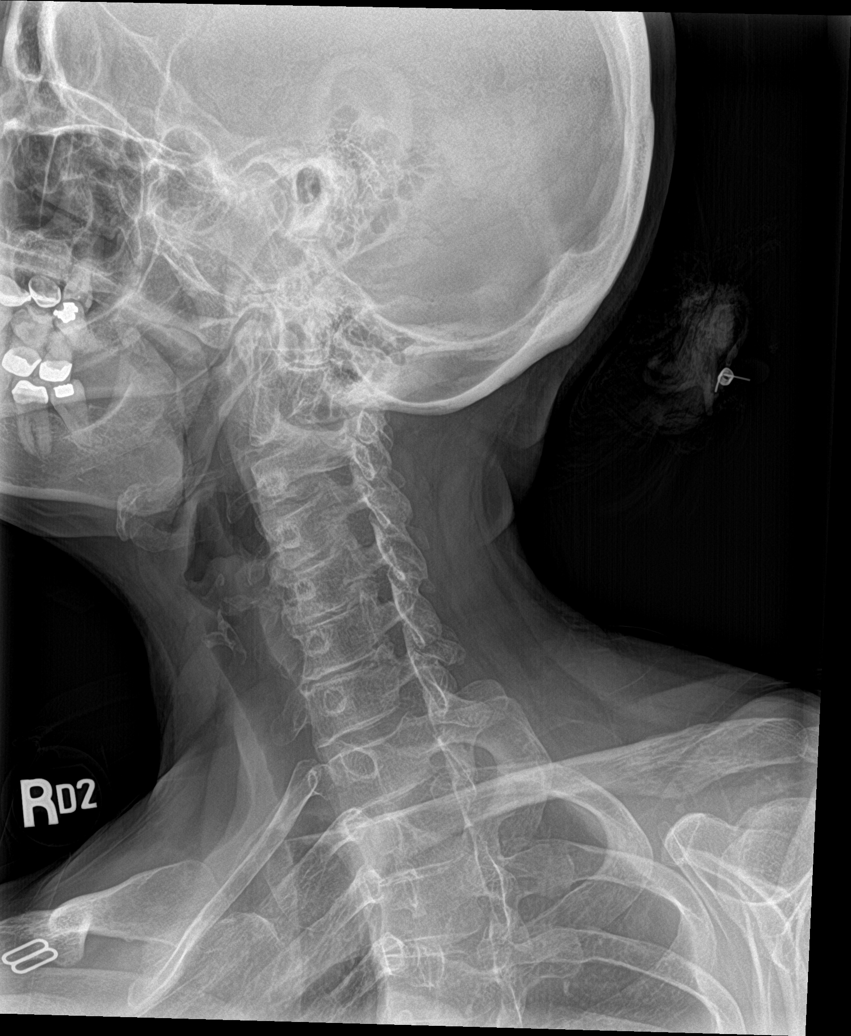

[c-spine ap]
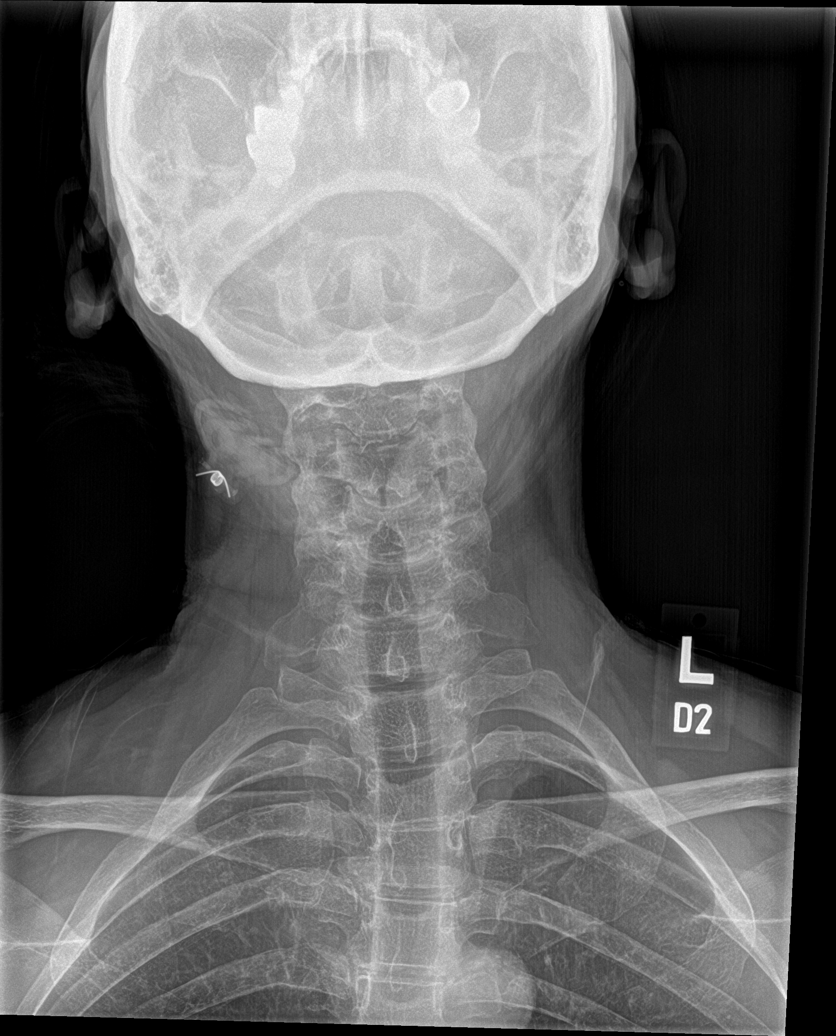

[c-spine open mouth]
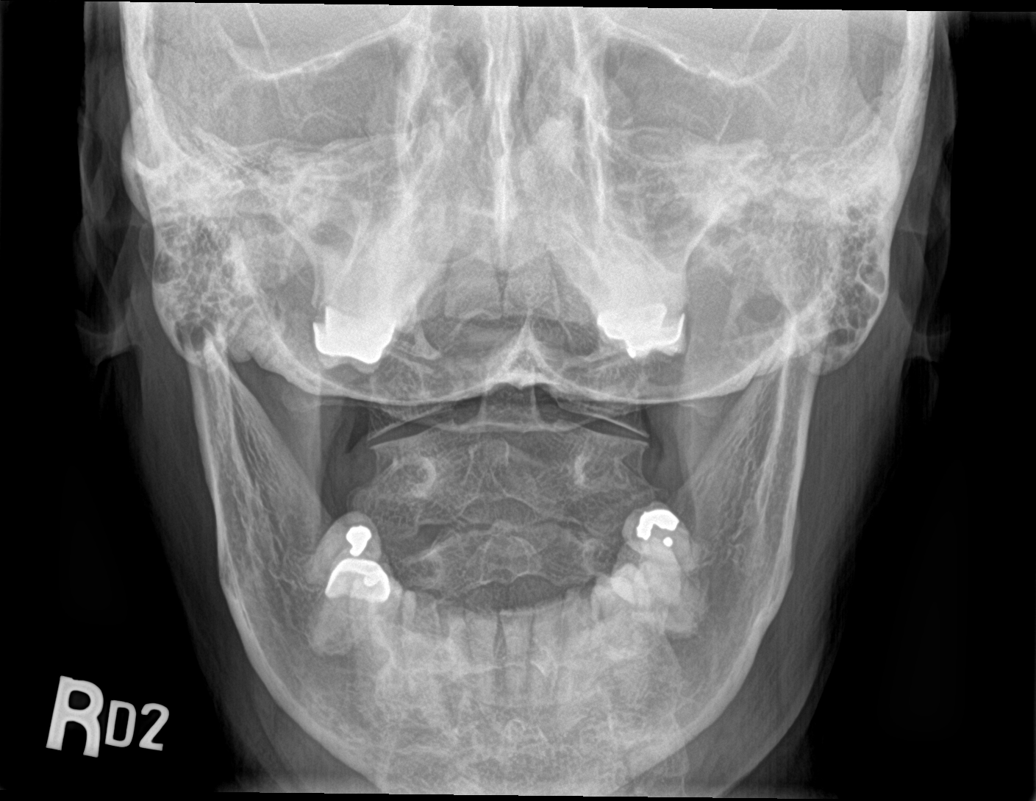

[[person_name]]
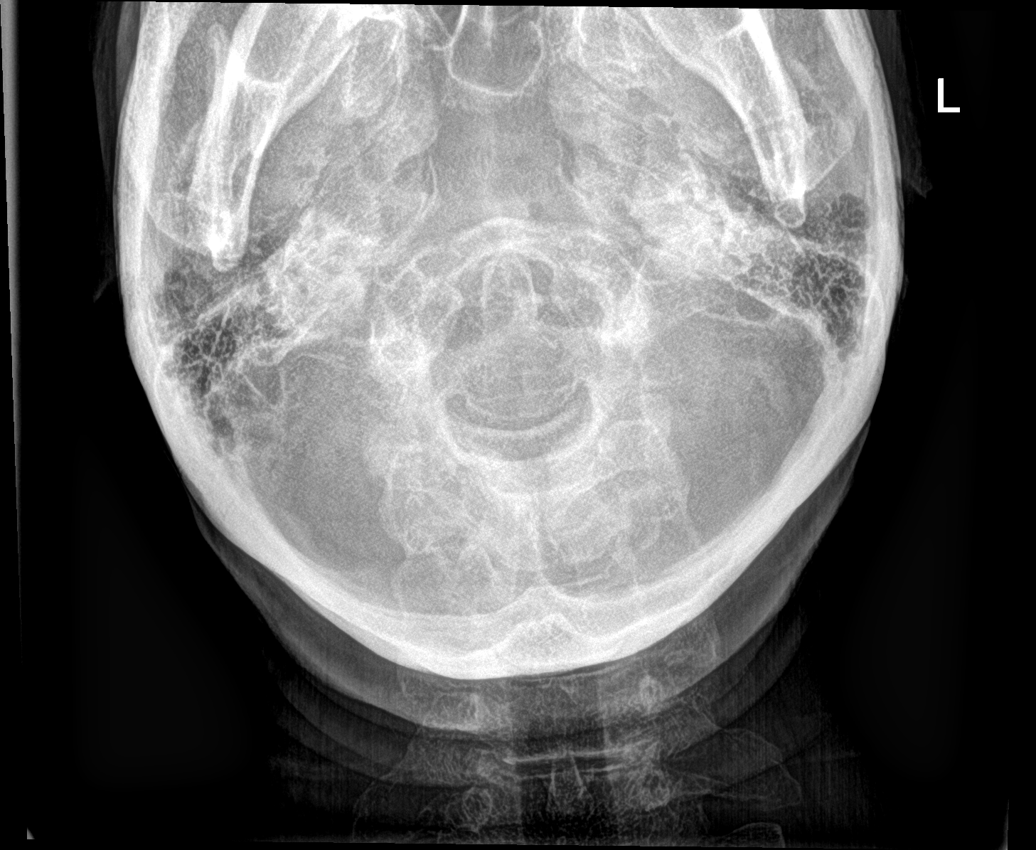

[6 of 6 positions shown; findings below may reference images not displayed]

FINDINGS: Straightening of usual cervical lordosis. This is likely positional
but could indicate muscle spasm. No anterior subluxation. Normal
alignment of the facet joints. C1-2 articulation appears intact. No
vertebral compression deformities. No focal bone lesion or bone
destruction. No prevertebral soft tissue swelling. Degenerative
changes with narrowed interspaces and endplate hypertrophic change
most prominent at C4-5, C5-6, and C6-7 levels. No significant bone
encroachment seen on the neural foramina.
IMPRESSION: Degenerative changes in the cervical spine. Straightening of usual
cervical lordosis may indicate muscle spasm. No acute displaced
fractures identified.

## 2022-02-10 ENCOUNTER — Telehealth: Payer: Self-pay

## 2022-02-10 NOTE — Telephone Encounter (Signed)
Last AEX-03/30/21--scheduled for 04/11/22. ?Last mammo- 09/07/21. ? ?Pt calling to report that she cancelled this years annual exam due to medicare not covering her annuals yearly but q2 years. But she is wondering if she can still have her Rx for her compounded estrogen cream. Reports she does need refill. Please advise.  ?

## 2022-02-10 NOTE — Telephone Encounter (Signed)
Per ML: ? ?"Recommend coming for her problem which is Postmenopausal Atrophic Vaginitis. I will do a gyn exam and prescribe."  ? ?LDVM on machine per DPR for pt to call back and schedule OV.  ?

## 2022-02-15 NOTE — Telephone Encounter (Signed)
FYI. Scheduled 04/08/22.  ?

## 2022-02-28 DIAGNOSIS — D1801 Hemangioma of skin and subcutaneous tissue: Secondary | ICD-10-CM | POA: Diagnosis not present

## 2022-02-28 DIAGNOSIS — D2272 Melanocytic nevi of left lower limb, including hip: Secondary | ICD-10-CM | POA: Diagnosis not present

## 2022-02-28 DIAGNOSIS — Z85820 Personal history of malignant melanoma of skin: Secondary | ICD-10-CM | POA: Diagnosis not present

## 2022-02-28 DIAGNOSIS — L821 Other seborrheic keratosis: Secondary | ICD-10-CM | POA: Diagnosis not present

## 2022-02-28 DIAGNOSIS — L820 Inflamed seborrheic keratosis: Secondary | ICD-10-CM | POA: Diagnosis not present

## 2022-02-28 DIAGNOSIS — L814 Other melanin hyperpigmentation: Secondary | ICD-10-CM | POA: Diagnosis not present

## 2022-02-28 DIAGNOSIS — D2261 Melanocytic nevi of right upper limb, including shoulder: Secondary | ICD-10-CM | POA: Diagnosis not present

## 2022-02-28 DIAGNOSIS — L570 Actinic keratosis: Secondary | ICD-10-CM | POA: Diagnosis not present

## 2022-02-28 DIAGNOSIS — Z85828 Personal history of other malignant neoplasm of skin: Secondary | ICD-10-CM | POA: Diagnosis not present

## 2022-04-08 ENCOUNTER — Ambulatory Visit (INDEPENDENT_AMBULATORY_CARE_PROVIDER_SITE_OTHER): Payer: Medicare HMO | Admitting: Obstetrics & Gynecology

## 2022-04-08 ENCOUNTER — Encounter: Payer: Self-pay | Admitting: Obstetrics & Gynecology

## 2022-04-08 ENCOUNTER — Other Ambulatory Visit: Payer: Self-pay

## 2022-04-08 VITALS — BP 116/70

## 2022-04-08 DIAGNOSIS — M8589 Other specified disorders of bone density and structure, multiple sites: Secondary | ICD-10-CM

## 2022-04-08 DIAGNOSIS — N952 Postmenopausal atrophic vaginitis: Secondary | ICD-10-CM

## 2022-04-08 DIAGNOSIS — Z79890 Hormone replacement therapy (postmenopausal): Secondary | ICD-10-CM | POA: Diagnosis not present

## 2022-04-08 MED ORDER — NONFORMULARY OR COMPOUNDED ITEM: 3 refills | Status: DC

## 2022-04-11 ENCOUNTER — Ambulatory Visit: Payer: Medicare HMO | Admitting: Obstetrics & Gynecology

## 2022-04-27 ENCOUNTER — Other Ambulatory Visit: Payer: Self-pay | Admitting: Family Medicine

## 2022-04-27 DIAGNOSIS — G43809 Other migraine, not intractable, without status migrainosus: Secondary | ICD-10-CM

## 2022-05-02 NOTE — Progress Notes (Unsigned)
Subjective:    Patient ID: Donna Guerra, female    DOB: 1950-11-19, 71 y.o.   MRN: [id number]  No chief complaint on file.   HPI Patient is in today for a CPE.  Past Medical History:  Diagnosis Date   Back pain 04/11/2017   CIN I (cervical intraepithelial neoplasia I) 2005   Colon polyps 07/08/2012   Hyperglycemia 06/13/2017   Hyperlipidemia 08/29/2012   Melanoma in situ of right shoulder ([place]) 03/17/2020   Migraines    Osteopenia 03/2018   T score -1.6 FRAX 11% / 2.9% stable from prior DEXA   Right shoulder pain 07/08/2012   Skin cancer 07/08/2012   Basal cell    Past Surgical History:  Procedure Laterality Date   ABDOMINAL SURGERY     Tummy tuck   CESAREAN SECTION  1978 and [place] CRYOSURGERY     OOPHORECTOMY  2002   BSO   SHOULDER SURGERY     right rotator cuff repair   TONSILLECTOMY AND ADENOIDECTOMY     age 1   TUBAL LIGATION     VAGINAL HYSTERECTOMY  2002   [facility] BSO    Family History  Problem Relation Age of Onset   Hypertension Mother    Breast cancer Mother 53   Colon cancer Mother 58   Breast cancer Maternal Aunt 21   Breast cancer Maternal Aunt        7's   Migraines Father    Migraines Sister    Cancer Sister        Melanoma   Diabetes Maternal Grandmother    Breast cancer Paternal Aunt        dx over 2   Breast cancer Sister 60   Cirrhosis Sister    Breast cancer Other 27       DCIS - niece   Prostate cancer Cousin        maternal first cousin   Colon cancer Cousin        maternal first cousin   Cancer Cousin        unknown cancer   Breast cancer Cousin 65       TN breast cancer - maternal first cousin    Social History   Socioeconomic History   Marital status: Married    Spouse name: Not on file   Number of children: Not on file   Years of education: Not on file   Highest education level: Not on file  Occupational History   Not on file  Tobacco  Use   Smoking status: Never   Smokeless tobacco: Never  Vaping Use   Vaping Use: Never used  Substance and Sexual Activity   Alcohol use: Not Currently    Comment: 2 drinks/month   Drug use: No   Sexual activity: Yes    Partners: Male    Birth control/protection: Surgical    Comment: HYST-1st intercourse 71 yo-Fewer than 5 partners  Other Topics Concern   Not on file  Social History Narrative   Not on file   Social Determinants of Health   Financial Resource Strain: Not on file  Food Insecurity: Not on file  Transportation Needs: Not on file  Physical Activity: Not on file  Stress: Not on file  Social Connections: Not on file  Intimate Partner Violence: Not on file    Outpatient Medications Prior to Visit  Medication  Sig Dispense Refill   atorvastatin (LIPITOR) 40 MG tablet Take 1 tablet (40 mg total) by mouth daily. 90 tablet 3   Cholecalciferol (VITAMIN D PO) Take 2,000 Units by mouth.     estradiol (ESTRACE) 0.5 MG tablet Take 1 tablet (0.5 mg total) by mouth daily. (Patient taking differently: Take by mouth daily. Takes 1/2 q o d) 90 tablet 4   fish oil-omega-3 fatty acids 1000 MG capsule Take 1 g by mouth daily.     NONFORMULARY OR COMPOUNDED ITEM Estradiol 0.02% Cream w/ prefilled applicators Insert 1gm vaginally twice weekly 24 each 3   SUMAtriptan (IMITREX) 100 MG tablet TAKE 1 TABLET BY MOUTH. MAY REPEAT IN 2 HOURS IF HEADACHE PERSISTS OR RECURS APPOINTMENT NEEDED FOR FURTHER REFILLS 9 tablet 0   No facility-administered medications prior to visit.    No Known Allergies  ROS     Objective:    Physical Exam  There were no vitals taken for this visit. Wt Readings from Last 3 Encounters:  10/27/21 127 lb (57.6 kg)  08/16/21 125 lb 6.4 oz (56.9 kg)  03/30/21 125 lb (56.7 kg)    Diabetic Foot Exam - Simple   No data filed    Lab Results  Component Value Date   WBC 7.8 11/09/2020   HGB 13.7 11/09/2020   HCT 39.6 11/09/2020   PLT 285.0 11/09/2020    GLUCOSE 104 (H) 10/27/2021   CHOL 169 10/27/2021   TRIG 90.0 10/27/2021   HDL 58.00 10/27/2021   LDLCALC 93 10/27/2021   ALT 34 10/27/2021   AST 25 10/27/2021   NA 139 10/27/2021   K 4.4 10/27/2021   CL 101 10/27/2021   CREATININE 0.76 10/27/2021   BUN 16 10/27/2021   CO2 31 10/27/2021   TSH 1.87 11/09/2020   HGBA1C 5.8 11/09/2020    Lab Results  Component Value Date   TSH 1.87 11/09/2020   Lab Results  Component Value Date   WBC 7.8 11/09/2020   HGB 13.7 11/09/2020   HCT 39.6 11/09/2020   MCV 95.2 11/09/2020   PLT 285.0 11/09/2020   Lab Results  Component Value Date   NA 139 10/27/2021   K 4.4 10/27/2021   CO2 31 10/27/2021   GLUCOSE 104 (H) 10/27/2021   BUN 16 10/27/2021   CREATININE 0.76 10/27/2021   BILITOT 0.9 10/27/2021   ALKPHOS 99 10/27/2021   AST 25 10/27/2021   ALT 34 10/27/2021   PROT 7.0 10/27/2021   ALBUMIN 4.6 10/27/2021   CALCIUM 10.6 (H) 10/27/2021   GFR 78.22 10/27/2021   Lab Results  Component Value Date   CHOL 169 10/27/2021   Lab Results  Component Value Date   HDL 58.00 10/27/2021   Lab Results  Component Value Date   LDLCALC 93 10/27/2021   Lab Results  Component Value Date   TRIG 90.0 10/27/2021   Lab Results  Component Value Date   CHOLHDL 3 10/27/2021   Lab Results  Component Value Date   HGBA1C 5.8 11/09/2020       Assessment & Plan:   COLONOSCOPY: 05/22/2015 MAMMO: 09/07/2021 PAP: 03/30/2021 PSA: n/a DEXA: 04/27/2021   Problem List Items Addressed This Visit   None   I am having Samika B. Whitsel maintain her fish oil-omega-3 fatty acids, Cholecalciferol (VITAMIN D PO), estradiol, atorvastatin, NONFORMULARY OR COMPOUNDED ITEM, and SUMAtriptan.  No orders of the defined types were placed in this encounter.

## 2022-05-03 ENCOUNTER — Encounter: Payer: Self-pay | Admitting: Family Medicine

## 2022-05-03 ENCOUNTER — Ambulatory Visit (INDEPENDENT_AMBULATORY_CARE_PROVIDER_SITE_OTHER): Payer: Medicare HMO | Admitting: Family Medicine

## 2022-05-03 VITALS — BP 124/70 | HR 72 | Resp 20 | Ht 63.5 in | Wt 126.0 lb

## 2022-05-03 DIAGNOSIS — R739 Hyperglycemia, unspecified: Secondary | ICD-10-CM | POA: Diagnosis not present

## 2022-05-03 DIAGNOSIS — M8589 Other specified disorders of bone density and structure, multiple sites: Secondary | ICD-10-CM

## 2022-05-03 DIAGNOSIS — Z23 Encounter for immunization: Secondary | ICD-10-CM | POA: Diagnosis not present

## 2022-05-03 DIAGNOSIS — E538 Deficiency of other specified B group vitamins: Secondary | ICD-10-CM | POA: Diagnosis not present

## 2022-05-03 DIAGNOSIS — R7989 Other specified abnormal findings of blood chemistry: Secondary | ICD-10-CM

## 2022-05-03 DIAGNOSIS — E785 Hyperlipidemia, unspecified: Secondary | ICD-10-CM

## 2022-05-03 DIAGNOSIS — G43809 Other migraine, not intractable, without status migrainosus: Secondary | ICD-10-CM

## 2022-05-03 DIAGNOSIS — Z0000 Encounter for general adult medical examination without abnormal findings: Secondary | ICD-10-CM | POA: Diagnosis not present

## 2022-05-03 MED ORDER — ATORVASTATIN CALCIUM 40 MG PO TABS: 40.0000 mg | ORAL_TABLET | Freq: Every day | ORAL | 3 refills | Status: DC

## 2022-05-03 MED ORDER — SUMATRIPTAN SUCCINATE 100 MG PO TABS
ORAL_TABLET | ORAL | 11 refills | Status: DC
Start: 2022-05-03 — End: 2023-05-19

## 2022-05-03 NOTE — Assessment & Plan Note (Signed)
Encouraged increased hydration, 64 ounces of clear fluids daily. Minimize alcohol and caffeine. Eat small frequent meals with lean proteins and complex carbs. Avoid high and low blood sugars. Get adequate sleep, 7-8 hours a night. Needs exercise daily preferably in the morning. Uses Sumitriptan with good results

## 2022-05-03 NOTE — Patient Instructions (Addendum)
[employer]er[employer] Mat[employer] tea do not drink   [employer] website for hearing loss  Need Tetanus Consider RSV in fall  Preventive Care 65 Years and Older, Female Preventive care refers to lifestyle choices and visits with your health care provider that can promote health and wellness. Preventive care visits are also called wellness exams. What can I expect for my preventive care visit? Counseling Your health care provider may ask you questions about your: Medical history, including: Past medical problems. Family medical history. Pregnancy and menstrual history. History of falls. Current health, including: Memory and ability to understand (cognition). Emotional well-being. Home life and relationship well-being. Sexual activity and sexual health. Lifestyle, including: Alcohol, nicotine or tobacco, and drug use. Access to firearms. Diet, exercise, and sleep habits. Work and work Statistician. Sunscreen use. Safety issues such as seatbelt and bike helmet use. Physical exam Your health care provider will check your: Height and weight. These may be used to calculate your BMI (body mass index). BMI is a measurement that tells if you are at a healthy weight. Waist circumference. This measures the distance around your waistline. This measurement also tells if you are at a healthy weight and may help predict your risk of certain diseases, such as type 2 diabetes and high blood pressure. Heart rate and blood pressure. Body temperature. Skin for abnormal spots. What immunizations do I need?  Vaccines are usually given at various ages, according to a schedule. Your health care provider will recommend vaccines for you based on your age, medical history, and lifestyle or other factors, such as travel or where you work. What tests do I need? Screening Your health care provider may recommend screening tests for certain conditions. This may include: Lipid and cholesterol levels. Hepatitis C test. Hepatitis B  test. HIV (human immunodeficiency virus) test. STI (sexually transmitted infection) testing, if you are at risk. Lung cancer screening. Colorectal cancer screening. Diabetes screening. This is done by checking your blood sugar (glucose) after you have not eaten for a while (fasting). Mammogram. Talk with your health care provider about how often you should have regular mammograms. BRCA-related cancer screening. This may be done if you have a family history of breast, ovarian, tubal, or peritoneal cancers. Bone density scan. This is done to screen for osteoporosis. Talk with your health care provider about your test results, treatment options, and if necessary, the need for more tests. Follow these instructions at home: Eating and drinking  Eat a diet that includes fresh fruits and vegetables, whole grains, lean protein, and low-fat dairy products. Limit your intake of foods with high amounts of sugar, saturated fats, and salt. Take vitamin and mineral supplements as recommended by your health care provider. Do not drink alcohol if your health care provider tells you not to drink. If you drink alcohol: Limit how much you have to 0-1 drink a day. Know how much alcohol is in your drink. In the [place] one drink equals one 12 oz bottle of beer (355 mL), one 5 oz glass of wine (148 mL), or one 1 oz glass of hard liquor (44 mL). Lifestyle Brush your teeth every morning and night with fluoride toothpaste. Floss one time each day. Exercise for at least 30 minutes 5 or more days each week. Do not use any products that contain nicotine or tobacco. These products include cigarettes, chewing tobacco, and vaping devices, such as e-cigarettes. If you need help quitting, ask your health care provider. Do not use drugs. If you are sexually active, practice  safe sex. Use a condom or other form of protection in order to prevent STIs. Take aspirin only as told by your health care provider. Make sure that you  understand how much to take and what form to take. Work with your health care provider to find out whether it is safe and beneficial for you to take aspirin daily. Ask your health care provider if you need to take a cholesterol-lowering medicine (statin). Find healthy ways to manage stress, such as: Meditation, yoga, or listening to music. Journaling. Talking to a trusted person. Spending time with friends and family. Minimize exposure to UV radiation to reduce your risk of skin cancer. Safety Always wear your seat belt while driving or riding in a vehicle. Do not drive: If you have been drinking alcohol. Do not ride with someone who has been drinking. When you are tired or distracted. While texting. If you have been using any mind-altering substances or drugs. Wear a helmet and other protective equipment during sports activities. If you have firearms in your house, make sure you follow all gun safety procedures. What's next? Visit your health care provider once a year for an annual wellness visit. Ask your health care provider how often you should have your eyes and teeth checked. Stay up to date on all vaccines. This information is not intended to replace advice given to you by your health care provider. Make sure you discuss any questions you have with your health care provider. Document Revised: 03/31/2021 Document Reviewed: 03/31/2021 Elsevier Patient Education  [place].

## 2022-05-03 NOTE — Assessment & Plan Note (Signed)
Patient encouraged to maintain heart healthy diet, regular exercise, adequate sleep. Consider daily probiotics. Take medications as prescribed. Labs ordered and reviewed Given Prevnar 20 COLONOSCOPY: 05/22/2015 MAMMO: 09/07/2021 PAP: 03/30/2021 DEXA: 04/27/2021

## 2022-05-03 NOTE — Assessment & Plan Note (Signed)
Encouraged to get adequate exercise, calcium and vitamin d intake 

## 2022-05-03 NOTE — Assessment & Plan Note (Signed)
hgba1c acceptable, minimize simple carbs. Increase exercise as tolerated. Continue current meds 

## 2022-05-03 NOTE — Assessment & Plan Note (Signed)
Tolerating statin, encouraged heart healthy diet, avoid trans fats, minimize simple carbs and saturated fats. Increase exercise as tolerated 

## 2022-05-04 DIAGNOSIS — E538 Deficiency of other specified B group vitamins: Secondary | ICD-10-CM | POA: Insufficient documentation

## 2022-05-04 LAB — VITAMIN B12: Vitamin B-12: 588 pg/mL (ref 211–911)

## 2022-05-04 LAB — LIPID PANEL
Cholesterol: 186 mg/dL (ref 0–200)
HDL: 59.2 mg/dL (ref >39.00)
LDL Cholesterol: 107 mg/dL — ABNORMAL HIGH (ref 0–99)
NonHDL: 126.41
Total CHOL/HDL Ratio: 3
Triglycerides: 98 mg/dL (ref 0.0–149.0)
VLDL: 19.6 mg/dL (ref 0.0–40.0)

## 2022-05-04 LAB — TSH: TSH: 1.95 u[IU]/mL (ref 0.35–5.50)

## 2022-05-04 LAB — CBC
HCT: 40.6 % (ref 36.0–46.0)
Hemoglobin: 13.9 g/dL (ref 12.0–15.0)
MCHC: 34.1 g/dL (ref 30.0–36.0)
MCV: 97.5 fl (ref 78.0–100.0)
Platelets: 275 10*3/uL (ref 150.0–400.0)
RBC: 4.17 Mil/uL (ref 3.87–5.11)
RDW: 12.9 % (ref 11.5–15.5)
WBC: 10.5 10*3/uL (ref 4.0–10.5)

## 2022-05-04 LAB — COMPREHENSIVE METABOLIC PANEL
ALT: 28 U/L (ref 0–35)
AST: 24 U/L (ref 0–37)
Albumin: 4.9 g/dL (ref 3.5–5.2)
Alkaline Phosphatase: 91 U/L (ref 39–117)
BUN: 14 mg/dL (ref 6–23)
CO2: 29 mEq/L (ref 19–32)
Calcium: 10.3 mg/dL (ref 8.4–10.5)
Chloride: 100 mEq/L (ref 96–112)
Creatinine, Ser: 0.77 mg/dL (ref 0.40–1.20)
GFR: 76.72 mL/min (ref >60.00)
Glucose, Bld: 86 mg/dL (ref 70–99)
Potassium: 4.2 mEq/L (ref 3.5–5.1)
Sodium: 139 mEq/L (ref 135–145)
Total Bilirubin: 1 mg/dL (ref 0.2–1.2)
Total Protein: 7.3 g/dL (ref 6.0–8.3)

## 2022-05-04 LAB — VITAMIN D 25 HYDROXY (VIT D DEFICIENCY, FRACTURES): VITD: 42.91 ng/mL (ref 30.00–100.00)

## 2022-05-04 NOTE — Assessment & Plan Note (Signed)
Continue to monitor

## 2022-06-07 ENCOUNTER — Ambulatory Visit (INDEPENDENT_AMBULATORY_CARE_PROVIDER_SITE_OTHER): Payer: Medicare HMO | Admitting: Family Medicine

## 2022-06-07 ENCOUNTER — Encounter: Payer: Self-pay | Admitting: Family Medicine

## 2022-06-07 VITALS — BP 108/70 | HR 76 | Temp 98.2°F | Ht 63.0 in | Wt 126.5 lb

## 2022-06-07 DIAGNOSIS — M25572 Pain in left ankle and joints of left foot: Secondary | ICD-10-CM | POA: Diagnosis not present

## 2022-06-07 NOTE — Progress Notes (Signed)
Musculoskeletal Exam  Patient: Donna Guerra DOB: [birth year]  DOS: 06/07/2022  SUBJECTIVE:  Chief Complaint:   Chief Complaint  Patient presents with   Joint Swelling    left    Donna Guerra is a 71 y.o.  female for evaluation and treatment of left ankle swelling.   Onset:  2 weeks ago.  [place] but does not remember any specific injury or change in activity Location: Left lateral ankle She is not having significant pain Progression of issue:  has slightly improved Associated symptoms: On her left ankle swelling No bruising, redness, shortness of breath Treatment: to date has been rest.   Neurovascular symptoms: no  Past Medical History:  Diagnosis Date   Back pain 04/11/2017   CIN I (cervical intraepithelial neoplasia I) 2005   Colon polyps 07/08/2012   Hyperglycemia 06/13/2017   Hyperlipidemia 08/29/2012   Melanoma in situ of right shoulder ([place]) 03/17/2020   Migraines    Osteopenia 03/2018   T score -1.6 FRAX 11% / 2.9% stable from prior DEXA   Right shoulder pain 07/08/2012   Skin cancer 07/08/2012   Basal cell    Objective: VITAL SIGNS: BP 108/70   Pulse 76   Temp 98.2 F (36.8 C) (Oral)   Ht '5\' 3"'$  (1.6 m)   Wt 126 lb 8 oz (57.4 kg)   SpO2 98%   BMI 22.41 kg/m  Constitutional: Well formed, well developed. No acute distress. Thorax & Lungs: No accessory muscle use Musculoskeletal: L ankle.   Normal active range of motion: yes.   Normal passive range of motion: yes Tenderness to palpation: Mild tenderness to palpation over the distal peroneus group just distal to the lateral malleolus; soft tissue edema noted over this area as well Deformity: no Ecchymosis: no Tests positive: None Tests negative: Anterior drawer Neurologic: Gait is normal Psychiatric: Normal mood. Age appropriate judgment and insight. Alert & oriented x 3.    Assessment:  Acute left ankle pain  Plan: Stretches/exercises, heat, ice, Tylenol.  We  discussed how this is unlikely to be a gout flare, CHF related swelling, or a DVT. F/u prn. The patient voiced understanding and agreement to the plan.   [place], DO 06/07/22  10:29 AM

## 2022-06-07 NOTE — Patient Instructions (Signed)
Heat (pad or rice pillow in microwave) over affected area, 10-15 minutes twice daily.   Ice/cold pack over area for 10-15 min twice daily.  OK to take Tylenol 1000 mg (2 extra strength tabs) or 975 mg (3 regular strength tabs) every 6 hours as needed.  Let us know if you need anything.  Ankle Exercises It is normal to feel mild stretching, pulling, tightness, or discomfort as you do these exercises, but you should stop right away if you feel sudden pain or your pain gets worse.  Stretching and range of motion exercises These exercises warm up your muscles and joints and improve the movement and flexibility of your ankle. These exercises also help to relieve pain, numbness, and tingling. Exercise A: Dorsiflexion/Plantar Flexion    Sit with your affected knee straight or bent. Do not rest your foot on anything. Flex your affected ankle to tilt the top of your foot toward your shin. Hold this position for 5 seconds. Point your toes downward to tilt the top of your foot away from your shin. Hold this position for 5 seconds. Repeat 2 times. Complete this exercise 3 times per week. Exercise B: Ankle Alphabet    Sit with your affected foot supported at your lower leg. Do not rest your foot on anything. Make sure your foot has room to move freely. Think of your affected foot as a paintbrush, and move your foot to trace each letter of the alphabet in the air. Keep your hip and knee still while you trace. Make the letters as large as you can without increasing any discomfort. Trace every letter from A to Z. Repeat 2 times. Complete this exercise 3 times per week. Strengthening exercises These exercises build strength and endurance in your ankle. Endurance is the ability to use your muscles for a long time, even after they get tired. Exercise D: Dorsiflexors    Secure a rubber exercise band or tube to an object, such as a table leg, that will stay still when the band is pulled. Secure the  other end around your affected foot. Sit on the floor, facing the object with your affected leg extended. The band or tube should be slightly tense when your foot is relaxed. Slowly flex your affected ankle and toes to bring your foot toward you. Hold this position for 3 seconds.  Slowly return your foot to the starting position, controlling the band as you do that. Do a total of 10 repetitions. Repeat 2 times. Complete this exercise 3 times per week. Exercise E: Plantar Flexors    Sit on the floor with your affected leg extended. Loop a rubber exercise band or tube around the ball of your affected foot. The ball of your foot is on the walking surface, right under your toes. The band or tube should be slightly tense when your foot is relaxed. Slowly point your toes downward, pushing them away from you. Hold this position for 3 seconds. Slowly release the tension in the band or tube, controlling smoothly until your foot is back in the starting position. Repeat for a total of 10 repetitions. Repeat 2 times. Complete this exercise 3 times per week. Exercise F: Towel Curls    Sit in a chair on a non-carpeted surface, and put your feet on the floor. Place a towel in front of your feet.  Keeping your heel on the floor, put your affected foot on the towel. Pull the towel toward you by grabbing the towel with your toes  and curling them under. Keep your heel on the floor. Let your toes relax. Grab the towel again. Keep going until the towel is completely underneath your foot. Repeat for a total of 10 repetitions. Repeat 2 times. Complete this exercise 3 times per week. Exercise G: Heel Raise ( Plantar Flexors, Standing)     Stand with your feet shoulder-width apart. Keep your weight spread evenly over the width of your feet while you rise up on your toes. Use a wall or table to steady yourself, but try not to use it for support. If this exercise is too easy, try these options: Shift your  weight toward your affected leg until you feel challenged. If told by your health care provider, lift your uninjured leg off the floor. Hold this position for 3 seconds. Repeat for a total of 10 repetitions. Repeat 2 times. Complete this exercise 3 times per week. Exercise H: Tandem Walking Stand with one foot directly in front of the other. Slowly raise your back foot up, lifting your heel before your toes, and place it directly in front of your other foot. Continue to walk in this heel-to-toe way for 10 steps or for as long as told by your health care provider. Have a countertop or wall nearby to use if needed to keep your balance, but try not to hold onto anything for support. Repeat 2 times. Complete this exercises 3 times per week. Make sure you discuss any questions you have with your health care provider. Document Released: 08/17/2005 Document Revised: 06/02/2016 Document Reviewed: 06/21/2015 Elsevier Interactive Patient Education  2018 [employer].

## 2022-07-29 ENCOUNTER — Telehealth: Payer: Self-pay | Admitting: Family Medicine

## 2022-07-29 NOTE — Telephone Encounter (Signed)
Patient called to advise she had been bitten by a dog while trying to separate them from fighting. She wanted to know If it would benefit her to get a tetanus shot. Both dogs were up to date on their rabies shot. Advised that per one of our CMA's it is recommended if it's been over 5-10 years since her last one. Advised her last TDAP was 2010. Patient stated she will go to [facility] and get one and then provide [place] with the information.

## 2022-09-13 ENCOUNTER — Other Ambulatory Visit: Payer: Self-pay | Admitting: Obstetrics & Gynecology

## 2022-09-13 DIAGNOSIS — Z1231 Encounter for screening mammogram for malignant neoplasm of breast: Secondary | ICD-10-CM

## 2022-11-07 ENCOUNTER — Ambulatory Visit
Admission: RE | Admit: 2022-11-07 | Discharge: 2022-11-07 | Disposition: A | Discharge status: Home / Self Care | Payer: Medicare HMO | Source: Ambulatory Visit | Attending: Obstetrics & Gynecology | Admitting: Obstetrics & Gynecology

## 2022-11-07 DIAGNOSIS — Z1231 Encounter for screening mammogram for malignant neoplasm of breast: Secondary | ICD-10-CM

## 2022-11-24 ENCOUNTER — Ambulatory Visit (INDEPENDENT_AMBULATORY_CARE_PROVIDER_SITE_OTHER): Payer: Medicare HMO | Admitting: Family Medicine

## 2022-11-24 ENCOUNTER — Encounter: Payer: Self-pay | Admitting: Family Medicine

## 2022-11-24 VITALS — BP 104/68 | HR 81 | Temp 98.0°F | Wt 128.0 lb

## 2022-11-24 DIAGNOSIS — J069 Acute upper respiratory infection, unspecified: Secondary | ICD-10-CM | POA: Diagnosis not present

## 2022-11-24 LAB — POCT INFLUENZA A/B
Influenza A, POC: NEGATIVE
Influenza B, POC: NEGATIVE

## 2022-11-24 LAB — POC COVID19 BINAXNOW: SARS Coronavirus 2 Ag: NEGATIVE

## 2022-11-24 MED ORDER — PREDNISONE 10 MG PO TABS: 10.0000 mg | ORAL_TABLET | Freq: Every day | ORAL | 0 refills | Status: AC

## 2022-11-24 NOTE — Progress Notes (Signed)
Established Patient Office Visit   Subjective:  Patient ID: Donna Guerra, female    DOB: 1951-07-16  Age: 72 y.o. MRN: [id number]  Chief Complaint  Patient presents with   Sinusitis    Sinus pressure, nasal congestion headache symptoms x 2 days.     Sinusitis Associated symptoms include congestion and neck pain. Pertinent negatives include no chills, coughing, ear pain or shortness of breath.   Encounter Diagnoses  Name Primary?   Viral upper respiratory tract infection Yes   3-day history of URI signs and symptoms with nasal congestion, clear rhinorrhea, postnasal drip, facial pressure teeth pain.  There has been no cough difficulty breathing or wheezing.  No fevers chills.  She is experiencing myalgias in her neck.  There is been no rash.  Husband has a cold.  Sister recently diagnosed with flu.    Review of Systems  Constitutional: Negative.  Negative for chills, fever and malaise/fatigue.  HENT:  Positive for congestion and sinus pain. Negative for ear pain and hearing loss.   Eyes:  Negative for blurred vision, discharge and redness.  Respiratory: Negative.  Negative for cough, shortness of breath and wheezing.   Cardiovascular: Negative.   Gastrointestinal:  Negative for abdominal pain.  Genitourinary: Negative.   Musculoskeletal:  Positive for myalgias and neck pain. Negative for joint pain.  Skin:  Negative for rash.  Neurological:  Negative for tingling, loss of consciousness and weakness.  Endo/Heme/Allergies:  Negative for polydipsia.     Current Outpatient Medications:    atorvastatin (LIPITOR) 40 MG tablet, Take 1 tablet (40 mg total) by mouth daily., Disp: 90 tablet, Rfl: 3   Cholecalciferol (VITAMIN D PO), Take 2,000 Units by mouth., Disp: , Rfl:    fish oil-omega-3 fatty acids 1000 MG capsule, Take 1 g by mouth daily., Disp: , Rfl:    predniSONE (DELTASONE) 10 MG tablet, Take 1 tablet (10 mg total) by mouth daily with breakfast for 5 days., Disp: 5 tablet,  Rfl: 0   SUMAtriptan (IMITREX) 100 MG tablet, TAKE 1 TABLET BY MOUTH. MAY REPEAT IN 2 HOURS IF HEADACHE PERSISTS OR RECURS APPOINTMENT NEEDED FOR FURTHER REFILLS, Disp: 9 tablet, Rfl: 11   estradiol (ESTRACE) 0.5 MG tablet, Take 1 tablet (0.5 mg total) by mouth daily. (Patient not taking: Reported on 11/24/2022), Disp: 90 tablet, Rfl: 4   NONFORMULARY OR COMPOUNDED ITEM, Estradiol 0.02% Cream w/ prefilled applicators Insert 1gm vaginally twice weekly, Disp: 24 each, Rfl: 3   Objective:     BP 104/68 (BP Location: Right Arm, Patient Position: Sitting, Cuff Size: Normal)   Pulse 81   Temp 98 F (36.7 C) (Temporal)   Wt 128 lb (58.1 kg)   SpO2 98%   BMI 22.67 kg/m    Physical Exam Constitutional:      General: She is not in acute distress.    Appearance: Normal appearance. She is not ill-appearing, toxic-appearing or diaphoretic.  HENT:     Head: Normocephalic and atraumatic.     Right Ear: Tympanic membrane, ear canal and external ear normal.     Left Ear: Tympanic membrane, ear canal and external ear normal.     Mouth/Throat:     Mouth: Mucous membranes are moist.     Pharynx: Oropharynx is clear. No oropharyngeal exudate or posterior oropharyngeal erythema.     Comments: There is clear postnasal drip.  Eyes:     General: No scleral icterus.       Right eye: No discharge.  Left eye: No discharge.     Extraocular Movements: Extraocular movements intact.     Conjunctiva/sclera: Conjunctivae normal.     Pupils: Pupils are equal, round, and reactive to light.  Cardiovascular:     Rate and Rhythm: Normal rate and regular rhythm.  Pulmonary:     Effort: Pulmonary effort is normal. No respiratory distress.     Breath sounds: Normal breath sounds. No wheezing or rales.  Musculoskeletal:     Cervical back: No rigidity or tenderness.  Skin:    General: Skin is warm and dry.  Neurological:     Mental Status: She is alert and oriented to person, place, and time.  Psychiatric:         Mood and Affect: Mood normal.        Behavior: Behavior normal.      Results for orders placed or performed in visit on 11/24/22  POC COVID-19  Result Value Ref Range   SARS Coronavirus 2 Ag Negative Negative  POCT Influenza A/B  Result Value Ref Range   Influenza A, POC Negative Negative   Influenza B, POC Negative Negative      The 10-year ASCVD risk score (Arnett DK, et al., 2019) is: 8.7%    Assessment & Plan:   Viral upper respiratory tract infection -     POC COVID-19 BinaxNow -     POCT Influenza A/B -     predniSONE; Take 1 tablet (10 mg total) by mouth daily with breakfast for 5 days.  Dispense: 5 tablet; Refill: 0    Return call monday if not improving.  Will use Mucinex D and low-dose prednisone for relief of facial pressure.  Will let me know if she is not improving by Monday.  Libby Maw, MD

## 2022-12-19 ENCOUNTER — Ambulatory Visit (INDEPENDENT_AMBULATORY_CARE_PROVIDER_SITE_OTHER): Payer: Medicare HMO | Admitting: Family Medicine

## 2022-12-19 ENCOUNTER — Encounter: Payer: Self-pay | Admitting: Family Medicine

## 2022-12-19 VITALS — BP 114/63 | HR 92 | Temp 97.1°F | Resp 18 | Ht 63.0 in | Wt 127.2 lb

## 2022-12-19 DIAGNOSIS — Z1152 Encounter for screening for COVID-19: Secondary | ICD-10-CM | POA: Diagnosis not present

## 2022-12-19 DIAGNOSIS — R051 Acute cough: Secondary | ICD-10-CM

## 2022-12-19 LAB — POCT INFLUENZA A/B
Influenza A, POC: NEGATIVE
Influenza B, POC: NEGATIVE

## 2022-12-19 LAB — POC COVID19 BINAXNOW: SARS Coronavirus 2 Ag: NEGATIVE

## 2022-12-19 MED ORDER — BENZONATATE 200 MG PO CAPS: 200.0000 mg | ORAL_CAPSULE | Freq: Two times a day (BID) | ORAL | 0 refills | Status: DC | PRN

## 2022-12-19 NOTE — Patient Instructions (Signed)
Flu and COVID testing negative Adding Tessalon for cough Continue supportive measures including rest, hydration, humidifier use, steam showers, warm compresses to sinuses, warm liquids with lemon and honey, and over-the-counter cough, cold, and analgesics as needed.  If symptoms persist for > 1 week, please let us know.   Please contact office for follow-up if symptoms do not improve or worsen. Seek emergency care if symptoms become severe.  The following information is provided as a Human resources officer for ADULT patients only and does NOT take into account PREGNANCY, ALLERGIES, LIVER CONDITIONS, KIDNEY CONDITIONS, GASTROINTESTINAL CONDITIONS, OR PRESCRIPTION MEDICATION INTERACTIONS. Please be sure to ask your provider if the following are safe to take with your specific medical history, conditions, or current medication regimen if you are unsure.   Adult Basic Symptom Management for Sinusitis  Congestion: Guaifenesin (Mucinex)- follow directions on packaging with a maximum dose of '2400mg'$  in a 24 hour period.  Pain/Fever: Ibuprofen '200mg'$  - '400mg'$  every 4-6 hours as needed (MAX '1200mg'$  in a 24 hour period) Pain/Fever: Tylenol '500mg'$  -'1000mg'$  every 6-8 hours as needed (MAX '3000mg'$  in a 24 hour period)  Cough: Dextromethorphan (Delsym)- follow directions on packing with a maximum dose of '120mg'$  in a 24 hour period.  Nasal Stuffiness: Saline nasal spray and/or Nettie Pot with sterile saline solution  Runny Nose: Fluticasone nasal spray (Flonase) OR Mometasone nasal spray (Nasonex) OR Triamcinolone Acetonide nasal spray (Nasacort)- follow directions on the packaging  Pain/Pressure: Warm washcloth to the face  Sore Throat: Warm salt water gargles  If you have allergies, you may also consider taking an oral antihistamine (like Zyrtec or Claritin) as these may also help with your symptoms.  **Many medications will have more than one ingredient, be sure you are reading the packaging carefully and not  taking more than one dose of the same kind of medication at the same time or too close together. It is OK to use formulas that have all of the ingredients you want, but do not take them in a combined medication and as separate dose too close together. If you have any questions, the pharmacist will be happy to help you decide what is safe.

## 2022-12-19 NOTE — Progress Notes (Signed)
Acute Office Visit  Subjective:     Patient ID: Donna Guerra, female    DOB: [birth year], 72 y.o.   MRN: [id number]  Chief Complaint  Patient presents with   Cough     Upper Respiratory Infection: Patient complains of symptoms of a URI. Symptoms include congestion, cough, and body aches, rhinorrhea, poor appetite, fatigued, occasional mild nausea, chills, headaches, yellow sputum with cough . Onset of symptoms was 4 days ago, unchanged since that time.  She is drinking plenty of fluids. Evaluation to date: none. Treatment to date:  OTC cough and cold meds . No fevers, ear pain, vomiting, diarrhea, chest pain, dyspnea, wheezing, hemoptysis.  Reports she recently found out that someone she had been around earlier last week has the flu. She was around several people over the weekend when she started feeling poorly, so she would like to know for sure if she has flu or COVID so she can inform them.      All review of systems negative except what is listed in the HPI      Objective:    BP 114/63 (BP Location: Right Arm, Patient Position: Sitting, Cuff Size: Normal)   Pulse 92   Temp (!) 97.1 F (36.2 C) (Oral)   Resp 18   Ht '5\' 3"'$  (1.6 m)   Wt 127 lb 3.2 oz (57.7 kg)   SpO2 98%   BMI 22.53 kg/m    Physical Exam Vitals reviewed.  Constitutional:      Appearance: Normal appearance.  HENT:     Head: Normocephalic and atraumatic.     Right Ear: Tympanic membrane normal.     Left Ear: Tympanic membrane normal.     Nose: Nose normal.     Mouth/Throat:     Mouth: Mucous membranes are moist.     Pharynx: Oropharynx is clear. No oropharyngeal exudate or posterior oropharyngeal erythema.  Eyes:     Conjunctiva/sclera: Conjunctivae normal.  Cardiovascular:     Rate and Rhythm: Normal rate and regular rhythm.     Pulses: Normal pulses.     Heart sounds: Normal heart sounds.  Pulmonary:     Effort: Pulmonary effort is normal.     Breath sounds: Normal breath sounds.   Musculoskeletal:     Cervical back: Normal range of motion and neck supple. No tenderness.  Lymphadenopathy:     Cervical: No cervical adenopathy.  Skin:    General: Skin is warm and dry.  Neurological:     Mental Status: She is alert and oriented to person, place, and time.  Psychiatric:        Mood and Affect: Mood normal.        Behavior: Behavior normal.        Thought Content: Thought content normal.        Judgment: Judgment normal.         Results for orders placed or performed in visit on 12/19/22  POC COVID-19  Result Value Ref Range   SARS Coronavirus 2 Ag Negative Negative  POCT Influenza A/B  Result Value Ref Range   Influenza A, POC Negative Negative   Influenza B, POC Negative Negative        Assessment & Plan:   Problem List Items Addressed This Visit   None Visit Diagnoses     Encounter for screening for COVID-19    -  Primary   Acute cough       Relevant Medications   benzonatate (TESSALON) 200  MG capsule   Other Relevant Orders   POC COVID-19 (Completed)   POCT Influenza A/B (Completed)     Flu and COVID testing negative Adding Tessalon for cough Continue supportive measures including rest, hydration, humidifier use, steam showers, warm compresses to sinuses, warm liquids with lemon and honey, and over-the-counter cough, cold, and analgesics as needed.  If symptoms persist for > 1 week, please let us know.  Patient aware of signs/symptoms requiring further/urgent evaluation.   Meds ordered this encounter  Medications   benzonatate (TESSALON) 200 MG capsule    Sig: Take 1 capsule (200 mg total) by mouth 2 (two) times daily as needed for cough.    Dispense:  20 capsule    Refill:  0    Order Specific Question:   Supervising Provider    Answer:   Penni Homans A [id number]    Return if symptoms worsen or fail to improve.  Terrilyn Saver, NP

## 2022-12-21 ENCOUNTER — Other Ambulatory Visit: Payer: Self-pay | Admitting: Family Medicine

## 2022-12-21 ENCOUNTER — Telehealth: Payer: Self-pay | Admitting: Family Medicine

## 2022-12-21 MED ORDER — POLYMYXIN B-TRIMETHOPRIM 10000-0.1 UNIT/ML-% OP SOLN: 2.0000 [drp] | OPHTHALMIC | 0 refills | Status: DC

## 2022-12-21 NOTE — Telephone Encounter (Signed)
Pt called stating that she is having swelling in her eye and is not sure if it is a stye or something else. Pt is wondering if there is anything that she could take OTC to help with this. Pt also stated that she does have some leftover drops that Dr. Charlett Blake had given her to treat an eye infection in the past and wondered if those would be feasible to use. Please Advise.

## 2022-12-22 NOTE — Telephone Encounter (Signed)
Sent pt mychart message of  The advise.

## 2023-05-03 DIAGNOSIS — D485 Neoplasm of uncertain behavior of skin: Secondary | ICD-10-CM | POA: Diagnosis not present

## 2023-05-03 DIAGNOSIS — L814 Other melanin hyperpigmentation: Secondary | ICD-10-CM | POA: Diagnosis not present

## 2023-05-03 DIAGNOSIS — D1801 Hemangioma of skin and subcutaneous tissue: Secondary | ICD-10-CM | POA: Diagnosis not present

## 2023-05-03 DIAGNOSIS — L820 Inflamed seborrheic keratosis: Secondary | ICD-10-CM | POA: Diagnosis not present

## 2023-05-03 DIAGNOSIS — Z85828 Personal history of other malignant neoplasm of skin: Secondary | ICD-10-CM | POA: Diagnosis not present

## 2023-05-03 DIAGNOSIS — L918 Other hypertrophic disorders of the skin: Secondary | ICD-10-CM | POA: Diagnosis not present

## 2023-05-03 DIAGNOSIS — C44519 Basal cell carcinoma of skin of other part of trunk: Secondary | ICD-10-CM | POA: Diagnosis not present

## 2023-05-03 DIAGNOSIS — L570 Actinic keratosis: Secondary | ICD-10-CM | POA: Diagnosis not present

## 2023-05-03 DIAGNOSIS — Z85820 Personal history of malignant melanoma of skin: Secondary | ICD-10-CM | POA: Diagnosis not present

## 2023-05-03 DIAGNOSIS — L821 Other seborrheic keratosis: Secondary | ICD-10-CM | POA: Diagnosis not present

## 2023-05-19 ENCOUNTER — Telehealth: Payer: Self-pay | Admitting: Family Medicine

## 2023-05-19 ENCOUNTER — Other Ambulatory Visit: Payer: Self-pay

## 2023-05-19 DIAGNOSIS — G43809 Other migraine, not intractable, without status migrainosus: Secondary | ICD-10-CM

## 2023-05-19 MED ORDER — SUMATRIPTAN SUCCINATE 100 MG PO TABS: ORAL_TABLET | ORAL | 11 refills | Status: DC

## 2023-05-19 NOTE — Telephone Encounter (Signed)
Prescription Request  05/19/2023  Is this a "Controlled Substance" medicine? No  LOV: Visit date not found  What is the name of the medication or equipment? SUMAtriptan (IMITREX) 100 MG tablet   Have you contacted your pharmacy to request a refill? No   Which pharmacy would you like this sent to?  [facility] [address] [place] - [address] [place] [address] Phone: [phone] Fax: [phone]    Patient notified that their request is being sent to the clinical staff for review and that they should receive a response within 2 business days.   Please advise at [facility] [phone]

## 2023-05-19 NOTE — Telephone Encounter (Signed)
Refill sent.

## 2023-06-01 ENCOUNTER — Encounter (INDEPENDENT_AMBULATORY_CARE_PROVIDER_SITE_OTHER): Payer: Self-pay

## 2023-06-08 DIAGNOSIS — C44519 Basal cell carcinoma of skin of other part of trunk: Secondary | ICD-10-CM | POA: Diagnosis not present

## 2023-06-08 DIAGNOSIS — L570 Actinic keratosis: Secondary | ICD-10-CM | POA: Diagnosis not present

## 2023-06-08 DIAGNOSIS — Z85820 Personal history of malignant melanoma of skin: Secondary | ICD-10-CM | POA: Diagnosis not present

## 2023-07-06 ENCOUNTER — Ambulatory Visit (INDEPENDENT_AMBULATORY_CARE_PROVIDER_SITE_OTHER): Payer: Medicare HMO | Admitting: Family Medicine

## 2023-07-06 VITALS — BP 125/69 | HR 68 | Temp 98.1°F | Resp 16 | Ht 63.0 in | Wt 128.0 lb

## 2023-07-06 DIAGNOSIS — Z1379 Encounter for other screening for genetic and chromosomal anomalies: Secondary | ICD-10-CM | POA: Diagnosis not present

## 2023-07-06 DIAGNOSIS — Z0000 Encounter for general adult medical examination without abnormal findings: Secondary | ICD-10-CM

## 2023-07-06 DIAGNOSIS — E538 Deficiency of other specified B group vitamins: Secondary | ICD-10-CM

## 2023-07-06 DIAGNOSIS — R002 Palpitations: Secondary | ICD-10-CM

## 2023-07-06 DIAGNOSIS — M8589 Other specified disorders of bone density and structure, multiple sites: Secondary | ICD-10-CM | POA: Diagnosis not present

## 2023-07-06 DIAGNOSIS — E785 Hyperlipidemia, unspecified: Secondary | ICD-10-CM | POA: Diagnosis not present

## 2023-07-06 DIAGNOSIS — R739 Hyperglycemia, unspecified: Secondary | ICD-10-CM | POA: Diagnosis not present

## 2023-07-06 DIAGNOSIS — G43809 Other migraine, not intractable, without status migrainosus: Secondary | ICD-10-CM

## 2023-07-06 LAB — LIPID PANEL
Cholesterol: 158 mg/dL (ref 0–200)
HDL: 60 mg/dL (ref >39.00)
LDL Cholesterol: 77 mg/dL (ref 0–99)
NonHDL: 98.12
Total CHOL/HDL Ratio: 3
Triglycerides: 107 mg/dL (ref 0.0–149.0)
VLDL: 21.4 mg/dL (ref 0.0–40.0)

## 2023-07-06 LAB — COMPREHENSIVE METABOLIC PANEL
ALT: 22 U/L (ref 0–35)
AST: 19 U/L (ref 0–37)
Albumin: 4.5 g/dL (ref 3.5–5.2)
Alkaline Phosphatase: 83 U/L (ref 39–117)
BUN: 15 mg/dL (ref 6–23)
CO2: 28 mEq/L (ref 19–32)
Calcium: 10.1 mg/dL (ref 8.4–10.5)
Chloride: 104 mEq/L (ref 96–112)
Creatinine, Ser: 0.76 mg/dL (ref 0.40–1.20)
GFR: 77.3 mL/min (ref >60.00)
Glucose, Bld: 109 mg/dL — ABNORMAL HIGH (ref 70–99)
Potassium: 4.5 mEq/L (ref 3.5–5.1)
Sodium: 140 mEq/L (ref 135–145)
Total Bilirubin: 0.8 mg/dL (ref 0.2–1.2)
Total Protein: 6.9 g/dL (ref 6.0–8.3)

## 2023-07-06 LAB — CBC WITH DIFFERENTIAL/PLATELET
Basophils Absolute: 0.1 10*3/uL (ref 0.0–0.1)
Basophils Relative: 1.2 % (ref 0.0–3.0)
Eosinophils Absolute: 0.2 10*3/uL (ref 0.0–0.7)
Eosinophils Relative: 2.4 % (ref 0.0–5.0)
HCT: 42.9 % (ref 36.0–46.0)
Hemoglobin: 14.3 g/dL (ref 12.0–15.0)
Lymphocytes Relative: 35.8 % (ref 12.0–46.0)
Lymphs Abs: 2.6 10*3/uL (ref 0.7–4.0)
MCHC: 33.4 g/dL (ref 30.0–36.0)
MCV: 98.9 fl (ref 78.0–100.0)
Monocytes Absolute: 0.4 10*3/uL (ref 0.1–1.0)
Monocytes Relative: 6.1 % (ref 3.0–12.0)
Neutro Abs: 4 10*3/uL (ref 1.4–7.7)
Neutrophils Relative %: 54.5 % (ref 43.0–77.0)
Platelets: 274 10*3/uL (ref 150.0–400.0)
RBC: 4.34 Mil/uL (ref 3.87–5.11)
RDW: 13 % (ref 11.5–15.5)
WBC: 7.3 10*3/uL (ref 4.0–10.5)

## 2023-07-06 LAB — HEMOGLOBIN A1C: Hgb A1c MFr Bld: 5.9 % (ref 4.6–6.5)

## 2023-07-06 LAB — VITAMIN B12: Vitamin B-12: 328 pg/mL (ref 211–911)

## 2023-07-06 LAB — TSH: TSH: 2.07 u[IU]/mL (ref 0.35–5.50)

## 2023-07-06 NOTE — Assessment & Plan Note (Addendum)
Patient encouraged to maintain heart healthy diet, regular exercise, adequate sleep. Consider daily probiotics. Take medications as prescribed. Labs ordered and reviewed Given Prevnar 20 COLONOSCOPY: patient reports last colonoscopy was 2022 but this is not in chart, last one sent to [place] by [facility] 05/22/2015, Dr Hinda Lenis, will have staff request MAMMO: 10/2022 repeat every1-2 years PAP: 03/30/2021 repeat every 3-5 years DEXA: 04/27/2021

## 2023-07-06 NOTE — Assessment & Plan Note (Signed)
monitor

## 2023-07-06 NOTE — Patient Instructions (Addendum)
Netflix [employer]  4000, 8000 steps  60-80 ounces of fluids, 10 ounces every 1-2 hours  Respiratory Syncitial Virus, RSV vaccine called Arexvy at pharmacy, at 39     Preventive Care 65 Years and Older, Female Preventive care refers to lifestyle choices and visits with your health care provider that can promote health and wellness. Preventive care visits are also called wellness exams. What can I expect for my preventive care visit? Counseling Your health care provider may ask you questions about your: Medical history, including: Past medical problems. Family medical history. Pregnancy and menstrual history. History of falls. Current health, including: Memory and ability to understand (cognition). Emotional well-being. Home life and relationship well-being. Sexual activity and sexual health. Lifestyle, including: Alcohol, nicotine or tobacco, and drug use. Access to firearms. Diet, exercise, and sleep habits. Work and work Astronomer. Sunscreen use. Safety issues such as seatbelt and bike helmet use. Physical exam Your health care provider will check your: Height and weight. These may be used to calculate your BMI (body mass index). BMI is a measurement that tells if you are at a healthy weight. Waist circumference. This measures the distance around your waistline. This measurement also tells if you are at a healthy weight and may help predict your risk of certain diseases, such as type 2 diabetes and high blood pressure. Heart rate and blood pressure. Body temperature. Skin for abnormal spots. What immunizations do I need?  Vaccines are usually given at various ages, according to a schedule. Your health care provider will recommend vaccines for you based on your age, medical history, and lifestyle or other factors, such as travel or where you work. What tests do I need? Screening Your health care provider may recommend screening tests for certain conditions.  This may include: Lipid and cholesterol levels. Hepatitis C test. Hepatitis B test. HIV (human immunodeficiency virus) test. STI (sexually transmitted infection) testing, if you are at risk. Lung cancer screening. Colorectal cancer screening. Diabetes screening. This is done by checking your blood sugar (glucose) after you have not eaten for a while (fasting). Mammogram. Talk with your health care provider about how often you should have regular mammograms. BRCA-related cancer screening. This may be done if you have a family history of breast, ovarian, tubal, or peritoneal cancers. Bone density scan. This is done to screen for osteoporosis. Talk with your health care provider about your test results, treatment options, and if necessary, the need for more tests. Follow these instructions at home: Eating and drinking  Eat a diet that includes fresh fruits and vegetables, whole grains, lean protein, and low-fat dairy products. Limit your intake of foods with high amounts of sugar, saturated fats, and salt. Take vitamin and mineral supplements as recommended by your health care provider. Do not drink alcohol if your health care provider tells you not to drink. If you drink alcohol: Limit how much you have to 0-1 drink a day. Know how much alcohol is in your drink. In the [place] one drink equals one 12 oz bottle of beer (355 mL), one 5 oz glass of wine (148 mL), or one 1 oz glass of hard liquor (44 mL). Lifestyle Brush your teeth every morning and night with fluoride toothpaste. Floss one time each day. Exercise for at least 30 minutes 5 or more days each week. Do not use any products that contain nicotine or tobacco. These products include cigarettes, chewing tobacco, and vaping devices, such as e-cigarettes. If you need help quitting, ask  your health care provider. Do not use drugs. If you are sexually active, practice safe sex. Use a condom or other form of protection in order to prevent  STIs. Take aspirin only as told by your health care provider. Make sure that you understand how much to take and what form to take. Work with your health care provider to find out whether it is safe and beneficial for you to take aspirin daily. Ask your health care provider if you need to take a cholesterol-lowering medicine (statin). Find healthy ways to manage stress, such as: Meditation, yoga, or listening to music. Journaling. Talking to a trusted person. Spending time with friends and family. Minimize exposure to UV radiation to reduce your risk of skin cancer. Safety Always wear your seat belt while driving or riding in a vehicle. Do not drive: If you have been drinking alcohol. Do not ride with someone who has been drinking. When you are tired or distracted. While texting. If you have been using any mind-altering substances or drugs. Wear a helmet and other protective equipment during sports activities. If you have firearms in your house, make sure you follow all gun safety procedures. What's next? Visit your health care provider once a year for an annual wellness visit. Ask your health care provider how often you should have your eyes and teeth checked. Stay up to date on all vaccines. This information is not intended to replace advice given to you by your health care provider. Make sure you discuss any questions you have with your health care provider. Document Revised: 03/31/2021 Document Reviewed: 03/31/2021 Elsevier Patient Education  2024 [employer].

## 2023-07-08 ENCOUNTER — Other Ambulatory Visit: Payer: Self-pay | Admitting: Family Medicine

## 2023-07-08 DIAGNOSIS — E785 Hyperlipidemia, unspecified: Secondary | ICD-10-CM

## 2023-07-09 ENCOUNTER — Encounter: Payer: Self-pay | Admitting: Family Medicine

## 2023-07-09 LAB — VITAMIN D 1,25 DIHYDROXY
Vitamin D 1, 25 (OH)2 Total: 38 pg/mL (ref 18–72)
Vitamin D2 1, 25 (OH)2: <8 pg/mL
Vitamin D3 1, 25 (OH)2: 38 pg/mL

## 2023-07-09 NOTE — Progress Notes (Addendum)
Subjective:    Patient ID: Donna Guerra, female    DOB: 1950/12/31, 72 y.o.   MRN: [id number]  No chief complaint on file.   HPI Discussed the use of AI scribe software for clinical note transcription with the patient, who gave verbal consent to proceed.  History of Present Illness   The patient, with a history of palpitations and migraines, presents for a routine check-up. She reports no current symptoms of palpitations but has had episodes in the past. She also experiences migraines, which she manages with Sumatriptan. The patient mentions a recent dog bite incident, for which she received a tetanus shot at a local [facility]. She expresses concern about her sugar levels, indicating a possible history of diabetes or prediabetes. The patient's previous medical history includes a colonoscopy a couple of years ago, where polyps were found and removed, and a mammography in 2024. She also mentions a DEXA bone scan in 2022 and a Pap smear in 2022.        Past Medical History:  Diagnosis Date   Back pain 04/11/2017   CIN I (cervical intraepithelial neoplasia I) 2005   Colon polyps 07/08/2012   Hyperglycemia 06/13/2017   Hyperlipidemia 08/29/2012   Melanoma in situ of right shoulder (HCC) 03/17/2020   Migraines    Osteopenia 03/2018   T score -1.6 FRAX 11% / 2.9% stable from prior DEXA   Right shoulder pain 07/08/2012   Skin cancer 07/08/2012   Basal cell    Past Surgical History:  Procedure Laterality Date   ABDOMINAL SURGERY     Tummy tuck   CESAREAN SECTION  1978 and 1981   CHOLECYSTECTOMY     COLPOSCOPY     COSMETIC SURGERY     GYNECOLOGIC CRYOSURGERY     OOPHORECTOMY  2002   BSO   SHOULDER SURGERY     right rotator cuff repair   TONSILLECTOMY AND ADENOIDECTOMY     age 55   TUBAL LIGATION     VAGINAL HYSTERECTOMY  2002   LAVH BSO    Family History  Problem Relation Age of Onset   Hypertension Mother    Breast cancer Mother 58   Colon cancer Mother 33   Breast  cancer Maternal Aunt 71   Breast cancer Maternal Aunt        74's   Migraines Father    Migraines Sister    Cancer Sister        Melanoma   Diabetes Maternal Grandmother    Breast cancer Paternal Aunt        dx over 21   Breast cancer Sister 1   Cirrhosis Sister    Breast cancer Other 42       DCIS - niece   Prostate cancer Cousin        maternal first cousin   Colon cancer Cousin        maternal first cousin   Cancer Cousin        unknown cancer   Breast cancer Cousin 32       TN breast cancer - maternal first cousin    Social History   Socioeconomic History   Marital status: Married    Spouse name: Not on file   Number of children: Not on file   Years of education: Not on file   Highest education level: Not on file  Occupational History   Not on file  Tobacco Use   Smoking status: Never   Smokeless tobacco: Never  Vaping Use   Vaping status: Never Used  Substance and Sexual Activity   Alcohol use: Not Currently    Comment: 2 drinks/month   Drug use: No   Sexual activity: Yes    Partners: Male    Birth control/protection: Surgical    Comment: HYST-1st intercourse 72 yo-Fewer than 5 partners  Other Topics Concern   Not on file  Social History Narrative   Not on file   Social Drivers of Health   Financial Resource Strain: Not on file  Food Insecurity: Not on file  Transportation Needs: Not on file  Physical Activity: Not on file  Stress: Not on file  Social Connections: Not on file  Intimate Partner Violence: Not on file    Outpatient Medications Prior to Visit  Medication Sig Dispense Refill   Cholecalciferol (VITAMIN D PO) Take 2,000 Units by mouth.     fish oil-omega-3 fatty acids 1000 MG capsule Take 1 g by mouth daily.     SUMAtriptan (IMITREX) 100 MG tablet TAKE 1 TABLET BY MOUTH. MAY REPEAT IN 2 HOURS IF HEADACHE PERSISTS OR RECURS APPOINTMENT NEEDED FOR FURTHER REFILLS 9 tablet 11   atorvastatin (LIPITOR) 40 MG tablet Take 1 tablet (40 mg  total) by mouth daily. 90 tablet 3   benzonatate (TESSALON) 200 MG capsule Take 1 capsule (200 mg total) by mouth 2 (two) times daily as needed for cough. 20 capsule 0   trimethoprim-polymyxin b (POLYTRIM) ophthalmic solution Place 2 drops into both eyes every 4 (four) hours. 10 mL 0   No facility-administered medications prior to visit.    No Known Allergies  Review of Systems  Constitutional:  Negative for chills, fever and malaise/fatigue.  HENT:  Negative for congestion and hearing loss.   Eyes:  Negative for discharge.  Respiratory:  Negative for cough, sputum production and shortness of breath.   Cardiovascular:  Negative for chest pain, palpitations and leg swelling.  Gastrointestinal:  Negative for abdominal pain, blood in stool, constipation, diarrhea, heartburn, nausea and vomiting.  Genitourinary:  Negative for dysuria, frequency, hematuria and urgency.  Musculoskeletal:  Negative for back pain, falls and myalgias.  Skin:  Negative for rash.  Neurological:  Positive for headaches. Negative for dizziness, sensory change, loss of consciousness and weakness.  Endo/Heme/Allergies:  Negative for environmental allergies. Does not bruise/bleed easily.  Psychiatric/Behavioral:  Negative for depression and suicidal ideas. The patient is not nervous/anxious and does not have insomnia.        Objective:    Physical Exam Constitutional:      General: She is not in acute distress.    Appearance: Normal appearance. She is not diaphoretic.  HENT:     Head: Normocephalic and atraumatic.     Right Ear: Tympanic membrane, ear canal and external ear normal.     Left Ear: Tympanic membrane, ear canal and external ear normal.     Nose: Nose normal.     Mouth/Throat:     Mouth: Mucous membranes are moist.     Pharynx: Oropharynx is clear. No oropharyngeal exudate.  Eyes:     General: No scleral icterus.       Right eye: No discharge.        Left eye: No discharge.      Conjunctiva/sclera: Conjunctivae normal.     Pupils: Pupils are equal, round, and reactive to light.  Neck:     Thyroid: No thyromegaly.  Cardiovascular:     Rate and Rhythm: Normal rate and regular rhythm.  Heart sounds: Normal heart sounds. No murmur heard. Pulmonary:     Effort: Pulmonary effort is normal. No respiratory distress.     Breath sounds: Normal breath sounds. No wheezing or rales.  Abdominal:     General: Bowel sounds are normal. There is no distension.     Palpations: Abdomen is soft. There is no mass.     Tenderness: There is no abdominal tenderness.  Musculoskeletal:        General: No tenderness. Normal range of motion.     Cervical back: Normal range of motion and neck supple.  Lymphadenopathy:     Cervical: No cervical adenopathy.  Skin:    General: Skin is warm and dry.     Findings: No rash.  Neurological:     General: No focal deficit present.     Mental Status: She is alert and oriented to person, place, and time.     Cranial Nerves: No cranial nerve deficit.     Coordination: Coordination normal.     Deep Tendon Reflexes: Reflexes are normal and symmetric. Reflexes normal.  Psychiatric:        Mood and Affect: Mood normal.        Behavior: Behavior normal.        Thought Content: Thought content normal.        Judgment: Judgment normal.    BP 125/69 (BP Location: Right Arm, Patient Position: Sitting, Cuff Size: Small)   Pulse 68   Temp 98.1 F (36.7 C) (Oral)   Resp 16   Ht 5\' 3"  (1.6 m)   Wt 128 lb (58.1 kg)   SpO2 100%   BMI 22.67 kg/m  Wt Readings from Last 3 Encounters:  07/06/23 128 lb (58.1 kg)  12/19/22 127 lb 3.2 oz (57.7 kg)  11/24/22 128 lb (58.1 kg)    Diabetic Foot Exam - Simple   No data filed    Lab Results  Component Value Date   WBC 7.3 07/06/2023   HGB 14.3 07/06/2023   HCT 42.9 07/06/2023   PLT 274.0 07/06/2023   GLUCOSE 109 (H) 07/06/2023   CHOL 158 07/06/2023   TRIG 107.0 07/06/2023   HDL 60.00 07/06/2023    LDLCALC 77 07/06/2023   ALT 22 07/06/2023   AST 19 07/06/2023   NA 140 07/06/2023   K 4.5 07/06/2023   CL 104 07/06/2023   CREATININE 0.76 07/06/2023   BUN 15 07/06/2023   CO2 28 07/06/2023   TSH 2.07 07/06/2023   HGBA1C 5.9 07/06/2023    Lab Results  Component Value Date   TSH 2.07 07/06/2023   Lab Results  Component Value Date   WBC 7.3 07/06/2023   HGB 14.3 07/06/2023   HCT 42.9 07/06/2023   MCV 98.9 07/06/2023   PLT 274.0 07/06/2023   Lab Results  Component Value Date   NA 140 07/06/2023   K 4.5 07/06/2023   CO2 28 07/06/2023   GLUCOSE 109 (H) 07/06/2023   BUN 15 07/06/2023   CREATININE 0.76 07/06/2023   BILITOT 0.8 07/06/2023   ALKPHOS 83 07/06/2023   AST 19 07/06/2023   ALT 22 07/06/2023   PROT 6.9 07/06/2023   ALBUMIN 4.5 07/06/2023   CALCIUM 10.1 07/06/2023   GFR 77.30 07/06/2023   Lab Results  Component Value Date   CHOL 158 07/06/2023   Lab Results  Component Value Date   HDL 60.00 07/06/2023   Lab Results  Component Value Date   LDLCALC 77 07/06/2023   Lab  Results  Component Value Date   TRIG 107.0 07/06/2023   Lab Results  Component Value Date   CHOLHDL 3 07/06/2023   Lab Results  Component Value Date   HGBA1C 5.9 07/06/2023       Assessment & Plan:  Preventative health care Assessment & Plan: Patient encouraged to maintain heart healthy diet, regular exercise, adequate sleep. Consider daily probiotics. Take medications as prescribed. Labs ordered and reviewed Given Prevnar 20 COLONOSCOPY: patient reports last colonoscopy was 2022 but this is not in chart, last one sent [facility] [place] [facility] 05/22/2015, Dr Hinda Lenis, will have staff request MAMMO: 10/2022 repeat every1-2 years PAP: 03/30/2021 repeat every 3-5 years DEXA: 04/27/2021   Disorder of vitamin B12 -     Vitamin B12  Genetic testing  Osteopenia of multiple sites -     Vitamin D 1,25 dihydroxy  Hyperglycemia -     Hemoglobin A1c -     Vitamin D 1,25 dihydroxy -      EKG 12-Lead  Heart palpitations Assessment & Plan: EKG today NSR. Consider cardiology consult if persists.   Orders: -     Comprehensive metabolic panel -     CBC with Differential/Platelet -     TSH -     Vitamin D 1,25 dihydroxy -     EKG 12-Lead  Hyperlipidemia, unspecified hyperlipidemia type -     Lipid panel -     Vitamin D 1,25 dihydroxy -     EKG 12-Lead  Hypercalcemia Assessment & Plan: monitor  Orders: -     Vitamin D 1,25 dihydroxy  Palpitations  Other migraine without status migrainosus, not intractable Assessment & Plan: Infrequent, continue Sumitriptan prn      Danise Edge, MD

## 2023-08-01 ENCOUNTER — Encounter: Payer: Self-pay | Admitting: Family Medicine

## 2023-08-29 DIAGNOSIS — H6121 Impacted cerumen, right ear: Secondary | ICD-10-CM | POA: Diagnosis not present

## 2023-10-04 NOTE — Assessment & Plan Note (Signed)
EKG today NSR. Consider cardiology consult if persists.

## 2023-10-04 NOTE — Assessment & Plan Note (Signed)
Infrequent, continue Sumitriptan prn

## 2023-10-05 ENCOUNTER — Other Ambulatory Visit: Payer: Self-pay | Admitting: Family Medicine

## 2023-10-05 DIAGNOSIS — E785 Hyperlipidemia, unspecified: Secondary | ICD-10-CM

## 2023-10-09 ENCOUNTER — Other Ambulatory Visit: Payer: Self-pay | Admitting: Family Medicine

## 2023-10-09 DIAGNOSIS — Z1231 Encounter for screening mammogram for malignant neoplasm of breast: Secondary | ICD-10-CM

## 2023-11-07 ENCOUNTER — Telehealth: Payer: Self-pay

## 2023-11-07 NOTE — Telephone Encounter (Signed)
Plan covers 9 tablets every 30 days. Information sent for scanning.

## 2023-11-10 ENCOUNTER — Ambulatory Visit
Admission: RE | Admit: 2023-11-10 | Discharge: 2023-11-10 | Disposition: A | Discharge status: Home / Self Care | Payer: Medicare HMO | Source: Ambulatory Visit | Attending: Family Medicine | Admitting: Family Medicine

## 2023-11-10 DIAGNOSIS — Z1231 Encounter for screening mammogram for malignant neoplasm of breast: Secondary | ICD-10-CM

## 2024-02-19 ENCOUNTER — Telehealth: Payer: Self-pay | Admitting: *Deleted

## 2024-02-19 NOTE — Telephone Encounter (Signed)
 Prescription declined.  Needs office visit to update her care.

## 2024-02-19 NOTE — Telephone Encounter (Signed)
 Med refill request:compounded estradiol  0.02 % cream Last AEX: OV 04/08/22 -ML Next AEX: 04/29/24 -BS Last MMG (if hormonal med) 11/10/23, Birads 1 neg  Rx last sent on 04/08/22 for: Estradiol  0.02% Cream w/ prefilled applicators Insert 1gm vaginally twice weekly Dispense 24; 3 RF  Refill authorized: Please Advise?

## 2024-02-20 NOTE — Telephone Encounter (Signed)
 Spoke with patient, advised per Dr. Colvin Dec.   AEX moved to 6/30 at 1100.   Encounter closed.

## 2024-04-08 ENCOUNTER — Other Ambulatory Visit: Payer: Self-pay | Admitting: Family Medicine

## 2024-04-08 DIAGNOSIS — E785 Hyperlipidemia, unspecified: Secondary | ICD-10-CM

## 2024-04-15 ENCOUNTER — Encounter: Payer: Self-pay | Admitting: Obstetrics and Gynecology

## 2024-04-15 ENCOUNTER — Ambulatory Visit (INDEPENDENT_AMBULATORY_CARE_PROVIDER_SITE_OTHER): Admitting: Obstetrics and Gynecology

## 2024-04-15 VITALS — BP 118/82 | HR 68 | Ht 64.5 in | Wt 126.0 lb

## 2024-04-15 DIAGNOSIS — N952 Postmenopausal atrophic vaginitis: Secondary | ICD-10-CM

## 2024-04-15 DIAGNOSIS — Z5181 Encounter for therapeutic drug level monitoring: Secondary | ICD-10-CM | POA: Diagnosis not present

## 2024-04-15 DIAGNOSIS — Z780 Asymptomatic menopausal state: Secondary | ICD-10-CM | POA: Diagnosis not present

## 2024-04-15 DIAGNOSIS — Z01419 Encounter for gynecological examination (general) (routine) without abnormal findings: Secondary | ICD-10-CM | POA: Diagnosis not present

## 2024-04-15 MED ORDER — NONFORMULARY OR COMPOUNDED ITEM: 12 refills | Status: AC

## 2024-04-15 NOTE — Progress Notes (Unsigned)
 73 y.o. G74P2002 Married Caucasian female here for a breast and pelvic exam.  Would like to discuss getting back on an estradiol  cream.   She is using compounded vaginal estradiol  from [facility].   The patient is also followed for vaginal atrophy.   Has 6 grandchildren. Retired from [employer], educational.   PCP: Domenica Harlene LABOR, MD   No LMP recorded. Patient has had a hysterectomy.           Sexually active: Yes.    The current method of family planning is status post hysterectomy.    Menopausal hormone therapy:  n/a Exercising: Yes.    Walking Smoker:  no  OB History     Gravida  2   Para  2   Term  2   Preterm      AB      Living  2      SAB      IAB      Ectopic      Multiple      Live Births              HEALTH MAINTENANCE: Last 2 paps: 03/30/21 neg 01/24/18 neg  History of abnormal Pap or positive HPV:  no Mammogram:  11/10/23 Breast Density Cat C, BIRADS Cat 1 neg  Colonoscopy:  05/22/15 - [facility], does every 5 years.   Bone Density:  04/27/21  Result  osteopenia  FRAX:  9%/1.3%  Immunization History  Administered Date(s) Administered   Influenza Whole 07/18/2011, 08/01/2012, 06/28/2013   PFIZER(Purple Top)SARS-COV-2 Vaccination 11/23/2019, 12/14/2019   PNEUMOCOCCAL CONJUGATE-20 05/03/2022   Pneumococcal Polysaccharide-23 11/09/2020   Td 07/29/2022   Tdap 07/21/2009   Zoster Recombinant(Shingrix) 11/11/2021, 02/10/2022   Zoster, Live 08/07/2012      reports that she has never smoked. She has never used smokeless tobacco. She reports that she does not currently use alcohol. She reports that she does not use drugs.  Past Medical History:  Diagnosis Date   Back pain 04/11/2017   CIN I (cervical intraepithelial neoplasia I) 2005   Colon polyps 07/08/2012   Hyperglycemia 06/13/2017   Hyperlipidemia 08/29/2012   Melanoma in situ of right shoulder (HCC) 03/17/2020   Migraines    Osteopenia 03/2018   T score -1.6 FRAX 11% / 2.9%  stable from prior DEXA   Right shoulder pain 07/08/2012   Skin cancer 07/08/2012   Basal cell    Past Surgical History:  Procedure Laterality Date   ABDOMINAL SURGERY     Tummy tuck   CESAREAN SECTION  1978 and 1981   CHOLECYSTECTOMY     COLPOSCOPY     COSMETIC SURGERY     GYNECOLOGIC CRYOSURGERY     OOPHORECTOMY  2002   BSO   SHOULDER SURGERY     right rotator cuff repair   TONSILLECTOMY AND ADENOIDECTOMY     age 29   TUBAL LIGATION     VAGINAL HYSTERECTOMY  2002   LAVH BSO    Current Outpatient Medications  Medication Sig Dispense Refill   atorvastatin  (LIPITOR) 40 MG tablet Take 1 tablet (40 mg total) by mouth daily. 90 tablet 1   Cholecalciferol (VITAMIN D  PO) Take 2,000 Units by mouth.     fish oil-omega-3 fatty acids 1000 MG capsule Take 1 g by mouth daily.     NONFORMULARY OR COMPOUNDED ITEM Estradiol  vaginal cream 0.02% 1 ml prefilled syringe, apply 1 ml per vagina twice weekly.  Disp:  28, RF  12.  [facility]. 28 each 12   RETIN-A 0.1 % cream SMARTSIG:sparingly Topical Every Night     SUMAtriptan  (IMITREX ) 100 MG tablet TAKE 1 TABLET BY MOUTH. MAY REPEAT IN 2 HOURS IF HEADACHE PERSISTS OR RECURS APPOINTMENT NEEDED FOR FURTHER REFILLS 9 tablet 11   No current facility-administered medications for this visit.    ALLERGIES: Patient has no known allergies.  Family History  Problem Relation Age of Onset   Hypertension Mother    Breast cancer Mother 45   Colon cancer Mother 56   Migraines Father    Migraines Sister    Cancer Sister        Melanoma   Breast cancer Sister 7   Cirrhosis Sister    Breast cancer Maternal Aunt 38   Breast cancer Maternal Aunt        50's   Breast cancer Paternal Aunt        dx over 15   Diabetes Maternal Grandmother    Prostate cancer Cousin        maternal first cousin   Colon cancer Cousin        maternal first cousin   Cancer Cousin        unknown cancer   Breast cancer Cousin 54       TN breast cancer -  maternal first cousin   Breast cancer Niece 39       DCIS    Review of Systems  All other systems reviewed and are negative.   PHYSICAL EXAM:  BP 118/82 (BP Location: Left Arm, Patient Position: Sitting)   Pulse 68   Ht 5' 4.5 (1.638 m)   Wt 126 lb (57.2 kg)   SpO2 98%   BMI 21.29 kg/m     General appearance: alert, cooperative and appears stated age Head: normocephalic, without obvious abnormality, atraumatic Neck: no adenopathy, supple, symmetrical, trachea midline and thyroid  normal to inspection and palpation Lungs: clear to auscultation bilaterally Breasts: normal appearance, no masses or tenderness, No nipple retraction or dimpling, No nipple discharge or bleeding, No axillary adenopathy Heart: regular rate and rhythm Abdomen: soft, non-tender; no masses, no organomegaly Extremities: extremities normal, atraumatic, no cyanosis or edema Skin: skin color, texture, turgor normal. No rashes or lesions Lymph nodes: cervical, supraclavicular, and axillary nodes normal. Neurologic: grossly normal  Pelvic: External genitalia:  no lesions              No abnormal inguinal nodes palpated.              Urethra:  normal appearing urethra with no masses, tenderness or lesions              Bartholins and Skenes: normal                 Vagina: normal appearing vagina with normal color and discharge, no lesions              Cervix:  absent              Pap taken: no. Bimanual Exam:  Uterus:  absent              Adnexa: no mass, fullness, tenderness              Rectal exam: yes.  Confirms.              Anus:  normal sphincter tone, no lesions  Chaperone was present for exam:  Kari HERO, CMA  ASSESSMENT: Encounter for  breast and pelvic exam.  Status post LAVH/BSO 2002.  Final pathology:  adenomyosis, fibroids, and simple hyperplasia without atypia, benign cervix, benign ovaries.  Hx cervical dysplasia. FH cancer.  Has varians of undetermined significance. Menopause. Osteopenia.     PLAN: Mammogram screening discussed. Self breast awareness reviewed. Pap and HRV collected:  no.  Not indicated. Guidelines for Calcium , Vitamin D , regular exercise program including cardiovascular and weight bearing exercise. Medication refills:  compounded vaginal estradiol  cream 0.02%, apply 1 mg twice weekly.   I discussed potential effect on breast cancer.  Labs with PCP.  BMD at [facility]  Follow up:  yearly and prn.    Additional counseling given.  {yes [id number]. ***  total time was spent for this patient encounter, including preparation, face-to-face counseling with the patient, coordination of care, and documentation of the encounter in addition to doing the breast and pelvic exam.

## 2024-04-15 NOTE — Patient Instructions (Addendum)
 Phone number for scheduling bone density at [facility], [phone].    EXERCISE AND DIET:  We recommended that you start or continue a regular exercise program for good health. Regular exercise means any activity that makes your heart beat faster and makes you sweat.  We recommend exercising at least 30 minutes per day at least 3 days a week, preferably 4 or 5.  We also recommend a diet low in fat and sugar.  Inactivity, poor dietary choices and obesity can cause diabetes, heart attack, stroke, and kidney damage, among others.    ALCOHOL AND SMOKING:  Women should limit their alcohol intake to no more than 7 drinks/beers/glasses of wine (combined, not each!) per week. Moderation of alcohol intake to this level decreases your risk of breast cancer and liver damage. And of course, no recreational drugs are part of a healthy lifestyle.  And absolutely no smoking or even second hand smoke. Most people know smoking can cause heart and lung diseases, but did you know it also contributes to weakening of your bones? Aging of your skin?  Yellowing of your teeth and nails?  CALCIUM AND VITAMIN D:  Adequate intake of calcium and Vitamin D are recommended.  The recommendations for exact amounts of these supplements seem to change often, but generally speaking 600 mg of calcium (either carbonate or citrate) and 800 units of Vitamin D per day seems prudent. Certain women may benefit from higher intake of Vitamin D.  If you are among these women, your doctor will have told you during your visit.    PAP SMEARS:  Pap smears, to check for cervical cancer or precancers,  have traditionally been done yearly, although recent scientific advances have shown that most women can have pap smears less often.  However, every woman still should have a physical exam from her gynecologist every year. It will include a breast check, inspection of the vulva and vagina to check for abnormal growths or skin changes, a visual exam of the  cervix, and then an exam to evaluate the size and shape of the uterus and ovaries.  And after 75 years of age, a rectal exam is indicated to check for rectal cancers. We will also provide age appropriate advice regarding health maintenance, like when you should have certain vaccines, screening for sexually transmitted diseases, bone density testing, colonoscopy, mammograms, etc.   MAMMOGRAMS:  All women over 6 years old should have a yearly mammogram. Many facilities now offer a "3D" mammogram, which may cost around $50 extra out of pocket. If possible,  we recommend you accept the option to have the 3D mammogram performed.  It both reduces the number of women who will be called back for extra views which then turn out to be normal, and it is better than the routine mammogram at detecting truly abnormal areas.    COLONOSCOPY:  Colonoscopy to screen for colon cancer is recommended for all women at age 70.  We know, you hate the idea of the prep.  We agree, BUT, having colon cancer and not knowing it is worse!!  Colon cancer so often starts as a polyp that can be seen and removed at colonscopy, which can quite literally save your life!  And if your first colonoscopy is normal and you have no family history of colon cancer, most women don't have to have it again for 10 years.  Once every ten years, you can do something that may end up saving your life, right?  We will  be happy to help you get it scheduled when you are ready.  Be sure to check your insurance coverage so you understand how much it will cost.  It may be covered as a preventative service at no cost, but you should check your particular policy.

## 2024-04-22 ENCOUNTER — Ambulatory Visit: Payer: Self-pay

## 2024-04-22 NOTE — Telephone Encounter (Signed)
 Copied from CRM [id number]. Topic: Clinical - Red Word Triage >> Apr 22, 2024  1:36 PM Robinson H wrote: [facility] that prompted transfer to Nurse Triage: Heart rate concern, can feel in her chest like her body is moving inside she checks pulse rates and it's up to 130-140  then goes back down to 74 and then jumps up to 130 without moving, monitor showing irregular heartbeat and blood pressure is in range, feeling fatigued. Reason for Disposition  Heart rhythm alert (e.g., you have irregular heartbeat) from personal wearable device (e.g., Apple Watch)  Answer Assessment - Initial Assessment Questions 1. DESCRIPTION: Please describe your heart rate or heartbeat that you are having (e.g., fast/slow, regular/irregular, skipped or extra beats, palpitations)     Beating fast 2. ONSET: When did it start? (Minutes, hours or days)       Atleast June 29th 3. DURATION: How long does it last (e.g., seconds, minutes, hours)     hours 4. PATTERN Does it come and go, or has it been constant since it started?  Does it get worse with exertion?   Are you feeling it now?     Comes and goes 5. TAP: Using your hand, can you tap out what you are feeling on a chair or table in front of you, so that I can hear? (Note: not all patients can do this)       Irregular  6. HEART RATE: Can you tell me your heart rate? How many beats in 15 seconds?  (Note: not all patients can do this)       85 7. RECURRENT SYMPTOM: Have you ever had this before? If Yes, ask: When was the last time? and What happened that time?      Na  8. CAUSE: What do you think is causing the palpitations?     Not sure 9. CARDIAC HISTORY: Do you have any history of heart disease? (e.g., heart attack, angina, bypass surgery, angioplasty, arrhythmia)      Denies  10. OTHER SYMPTOMS: Do you have any other symptoms? (e.g., dizziness, chest pain, sweating, difficulty breathing)       Fatigue    Pt has been monitoring BP/RN  since 6/29 but has felt symptoms prior to this date. . Pt feels movement inside body when heart is racing. Heart rate bounces from 64-140 over last several weeks. Last night HR was 139. At 1330 today: 122/66 HR 85-irregular per monitor.  Pt feels caffeine induced. No chest pain and SOB.  Protocols used: Heart Rate and Heartbeat Questions-A-AH

## 2024-04-23 ENCOUNTER — Ambulatory Visit: Attending: Internal Medicine

## 2024-04-23 ENCOUNTER — Ambulatory Visit (INDEPENDENT_AMBULATORY_CARE_PROVIDER_SITE_OTHER): Admitting: Internal Medicine

## 2024-04-23 VITALS — BP 116/72 | HR 77 | Resp 16 | Ht 64.5 in | Wt 126.4 lb

## 2024-04-23 DIAGNOSIS — R002 Palpitations: Secondary | ICD-10-CM | POA: Diagnosis not present

## 2024-04-23 LAB — CBC WITH DIFFERENTIAL/PLATELET
Basophils Absolute: 0.1 K/uL (ref 0.0–0.1)
Basophils Relative: 1.1 % (ref 0.0–3.0)
Eosinophils Absolute: 0.1 K/uL (ref 0.0–0.7)
Eosinophils Relative: 2.1 % (ref 0.0–5.0)
HCT: 42.2 % (ref 36.0–46.0)
Hemoglobin: 14.4 g/dL (ref 12.0–15.0)
Lymphocytes Relative: 26.8 % (ref 12.0–46.0)
Lymphs Abs: 1.8 K/uL (ref 0.7–4.0)
MCHC: 34.1 g/dL (ref 30.0–36.0)
MCV: 97.1 fl (ref 78.0–100.0)
Monocytes Absolute: 0.5 K/uL (ref 0.1–1.0)
Monocytes Relative: 7 % (ref 3.0–12.0)
Neutro Abs: 4.1 K/uL (ref 1.4–7.7)
Neutrophils Relative %: 63 % (ref 43.0–77.0)
Platelets: 266 K/uL (ref 150.0–400.0)
RBC: 4.34 Mil/uL (ref 3.87–5.11)
RDW: 13 % (ref 11.5–15.5)
WBC: 6.6 K/uL (ref 4.0–10.5)

## 2024-04-23 LAB — MAGNESIUM: Magnesium: 1.9 mg/dL (ref 1.5–2.5)

## 2024-04-23 LAB — COMPREHENSIVE METABOLIC PANEL WITH GFR
ALT: 23 U/L (ref 0–35)
AST: 22 U/L (ref 0–37)
Albumin: 4.6 g/dL (ref 3.5–5.2)
Alkaline Phosphatase: 71 U/L (ref 39–117)
BUN: 14 mg/dL (ref 6–23)
CO2: 30 meq/L (ref 19–32)
Calcium: 10.4 mg/dL (ref 8.4–10.5)
Chloride: 104 meq/L (ref 96–112)
Creatinine, Ser: 0.77 mg/dL (ref 0.40–1.20)
GFR: 75.67 mL/min (ref >60.00)
Glucose, Bld: 100 mg/dL — ABNORMAL HIGH (ref 70–99)
Potassium: 4.6 meq/L (ref 3.5–5.1)
Sodium: 142 meq/L (ref 135–145)
Total Bilirubin: 0.8 mg/dL (ref 0.2–1.2)
Total Protein: 6.7 g/dL (ref 6.0–8.3)

## 2024-04-23 LAB — TSH: TSH: 1.83 u[IU]/mL (ref 0.35–5.50)

## 2024-04-23 NOTE — Progress Notes (Signed)
 Subjective:    Patient ID: Donna Guerra, female    DOB: 1951-09-06, 73 y.o.   MRN: [id number]  DOS:  04/23/2024 Type of visit - description: Acute  Symptoms started over a year ago on and off. She started to pay more attention for the last couple of weeks. Episode happens for few minutes several times a day,  several days in a week. Described as palpitation and irregular heartbeat. No associated diaphoresis, nausea, chest pain or difficulty breathing. During the episodes her BPs have been normal in the 130/70 range. Heart rate has been elevated few times in the 130s to 140s range.  Otherwise is in the 60s or 70s. Her BP machine reports irregular heartbeat.  Symptoms are not associated with exertion, she is able to take her regular walks without problems. Denies the use of any decongestants.  Review of Systems See above   Past Medical History:  Diagnosis Date   Back pain 04/11/2017   CIN I (cervical intraepithelial neoplasia I) 2005   Colon polyps 07/08/2012   Hyperglycemia 06/13/2017   Hyperlipidemia 08/29/2012   Melanoma in situ of right shoulder (HCC) 03/17/2020   Migraines    Osteopenia 03/2018   T score -1.6 FRAX 11% / 2.9% stable from prior DEXA   Right shoulder pain 07/08/2012   Skin cancer 07/08/2012   Basal cell    Past Surgical History:  Procedure Laterality Date   ABDOMINAL SURGERY     Tummy tuck   CESAREAN SECTION  1978 and 1981   CHOLECYSTECTOMY     COLPOSCOPY     COSMETIC SURGERY     GYNECOLOGIC CRYOSURGERY     OOPHORECTOMY  2002   BSO   SHOULDER SURGERY     right rotator cuff repair   TONSILLECTOMY AND ADENOIDECTOMY     age 10   TUBAL LIGATION     VAGINAL HYSTERECTOMY  2002   LAVH BSO    Current Outpatient Medications  Medication Instructions   atorvastatin  (LIPITOR) 40 mg, Oral, Daily   Cholecalciferol (VITAMIN D  PO) 2,000 Units   fish oil-omega-3 fatty acids 1 g, Daily   NONFORMULARY OR COMPOUNDED ITEM Estradiol  vaginal cream 0.02% 1  ml prefilled syringe, apply 1 ml per vagina twice weekly.  Disp:  28, RF 12.  [facility].   RETIN-A 0.1 % cream SMARTSIG:sparingly Topical Every Night   SUMAtriptan  (IMITREX ) 100 MG tablet TAKE 1 TABLET BY MOUTH. MAY REPEAT IN 2 HOURS IF HEADACHE PERSISTS OR RECURS APPOINTMENT NEEDED FOR FURTHER REFILLS       Objective:   Physical Exam BP 116/72   Pulse 77   Resp 16   Ht 5' 4.5 (1.638 m)   Wt 126 lb 6.4 oz (57.3 kg)   SpO2 96%   BMI 21.36 kg/m  General:   Well developed, NAD, BMI noted. HEENT:  Normocephalic . Face symmetric, atraumatic. No thyromegaly. Lungs:  CTA B Normal respiratory effort, no intercostal retractions, no accessory muscle use. Heart: RRR,  no murmur.  Lower extremities: no pretibial edema bilaterally  Skin: Not pale. Not jaundice Neurologic:  alert & oriented X3.  Speech normal, gait appropriate for age and unassisted Psych--  Cognition and judgment appear intact.  Cooperative with normal attention span and concentration.  Behavior appropriate. No anxious or depressed appearing.      Assessment     73 year old female. PMH includes high cholesterol, migraines, hyperglycemia, history of melanoma, presents with:  Palpitations: As described above, going on for at least  a year, seem to be related to caffeine. EKG today: Sinus rhythm, + PACs. Plan: - Labs including TSH and electrolytes - Avoid caffeine, decongestants - Zio patch - Refer to cardiology - ER if red flag symptoms, see AVS. - See PCP on follow-up

## 2024-04-23 NOTE — Progress Notes (Unsigned)
 EP to read.

## 2024-04-23 NOTE — Patient Instructions (Signed)
 Avoid caffeine. Avoid any decongestants.  Continue recording your blood pressure and heart rate.  We are referring you to cardiology  We are ordering Zio patch to check your heart for 1 week.  Go to the ER if: Severe symptoms, nausea, chest pain, fainting feeling, sweats, shortness of breath.    GO TO THE LAB :  Get the blood work   Your results will be posted on MyChart with my comments  See your primary doctor in 3 months please make an appointment before you leave today    STOP BY THE FIRST FLOOR:  get the XR

## 2024-04-25 ENCOUNTER — Ambulatory Visit: Payer: Self-pay | Admitting: Internal Medicine

## 2024-04-28 ENCOUNTER — Other Ambulatory Visit: Payer: Self-pay

## 2024-04-28 ENCOUNTER — Encounter (HOSPITAL_BASED_OUTPATIENT_CLINIC_OR_DEPARTMENT_OTHER): Payer: Self-pay | Admitting: Emergency Medicine

## 2024-04-28 ENCOUNTER — Emergency Department (HOSPITAL_BASED_OUTPATIENT_CLINIC_OR_DEPARTMENT_OTHER)

## 2024-04-28 ENCOUNTER — Emergency Department (HOSPITAL_BASED_OUTPATIENT_CLINIC_OR_DEPARTMENT_OTHER)
Admission: EM | Admit: 2024-04-28 | Discharge: 2024-04-28 | Disposition: A | Discharge status: Home / Self Care | Attending: Emergency Medicine | Admitting: Emergency Medicine

## 2024-04-28 DIAGNOSIS — R519 Headache, unspecified: Secondary | ICD-10-CM | POA: Diagnosis present

## 2024-04-28 DIAGNOSIS — G43009 Migraine without aura, not intractable, without status migrainosus: Secondary | ICD-10-CM | POA: Insufficient documentation

## 2024-04-28 LAB — CBC WITH DIFFERENTIAL/PLATELET
Abs Immature Granulocytes: 0.03 K/uL (ref 0.00–0.07)
Basophils Absolute: 0.1 K/uL (ref 0.0–0.1)
Basophils Relative: 1 %
Eosinophils Absolute: 0.2 K/uL (ref 0.0–0.5)
Eosinophils Relative: 2 %
HCT: 39 % (ref 36.0–46.0)
Hemoglobin: 13.7 g/dL (ref 12.0–15.0)
Immature Granulocytes: 0 %
Lymphocytes Relative: 23 %
Lymphs Abs: 2.2 K/uL (ref 0.7–4.0)
MCH: 33.6 pg (ref 26.0–34.0)
MCHC: 35.1 g/dL (ref 30.0–36.0)
MCV: 95.6 fL (ref 80.0–100.0)
Monocytes Absolute: 0.6 K/uL (ref 0.1–1.0)
Monocytes Relative: 7 %
Neutro Abs: 6.3 K/uL (ref 1.7–7.7)
Neutrophils Relative %: 67 %
Platelets: 268 K/uL (ref 150–400)
RBC: 4.08 MIL/uL (ref 3.87–5.11)
RDW: 11.9 % (ref 11.5–15.5)
WBC: 9.4 K/uL (ref 4.0–10.5)
nRBC: 0 % (ref 0.0–0.2)

## 2024-04-28 LAB — COMPREHENSIVE METABOLIC PANEL WITH GFR
ALT: 27 U/L (ref 0–44)
AST: 24 U/L (ref 15–41)
Albumin: 4.4 g/dL (ref 3.5–5.0)
Alkaline Phosphatase: 85 U/L (ref 38–126)
Anion gap: 14 (ref 5–15)
BUN: 18 mg/dL (ref 8–23)
CO2: 21 mmol/L — ABNORMAL LOW (ref 22–32)
Calcium: 9.4 mg/dL (ref 8.9–10.3)
Chloride: 105 mmol/L (ref 98–111)
Creatinine, Ser: 0.76 mg/dL (ref 0.44–1.00)
GFR, Estimated: >60 mL/min (ref >60)
Glucose, Bld: 154 mg/dL — ABNORMAL HIGH (ref 70–99)
Potassium: 4 mmol/L (ref 3.5–5.1)
Sodium: 140 mmol/L (ref 135–145)
Total Bilirubin: 0.7 mg/dL (ref 0.0–1.2)
Total Protein: 6.8 g/dL (ref 6.5–8.1)

## 2024-04-28 MED ORDER — DIPHENHYDRAMINE HCL 50 MG/ML IJ SOLN
12.5000 mg | Freq: Once | INTRAMUSCULAR | Status: AC
  Administered 2024-04-28: 12.5 mg via INTRAVENOUS
  Filled 2024-04-28: qty 1

## 2024-04-28 MED ORDER — CYCLOBENZAPRINE HCL 5 MG PO TABS: 5.0000 mg | ORAL_TABLET | Freq: Two times a day (BID) | ORAL | 0 refills | Status: DC | PRN

## 2024-04-28 MED ORDER — IOHEXOL 350 MG/ML SOLN
100.0000 mL | Freq: Once | INTRAVENOUS | Status: AC | PRN
  Administered 2024-04-28: 75 mL via INTRAVENOUS

## 2024-04-28 MED ORDER — SODIUM CHLORIDE 0.9 % IV BOLUS
1000.0000 mL | Freq: Once | INTRAVENOUS | Status: AC
  Administered 2024-04-28: 1000 mL via INTRAVENOUS

## 2024-04-28 MED ORDER — KETOROLAC TROMETHAMINE 15 MG/ML IJ SOLN
15.0000 mg | Freq: Once | INTRAMUSCULAR | Status: AC
  Administered 2024-04-28: 15 mg via INTRAVENOUS
  Filled 2024-04-28: qty 1

## 2024-04-28 MED ORDER — MAGNESIUM SULFATE 2 GM/50ML IV SOLN
2.0000 g | Freq: Once | INTRAVENOUS | Status: AC
  Administered 2024-04-28: 2 g via INTRAVENOUS
  Filled 2024-04-28: qty 50

## 2024-04-28 MED ORDER — DEXAMETHASONE SODIUM PHOSPHATE 10 MG/ML IJ SOLN
10.0000 mg | Freq: Once | INTRAMUSCULAR | Status: AC
  Administered 2024-04-28: 10 mg via INTRAVENOUS
  Filled 2024-04-28: qty 1

## 2024-04-28 MED ORDER — PROCHLORPERAZINE EDISYLATE 10 MG/2ML IJ SOLN
5.0000 mg | Freq: Once | INTRAMUSCULAR | Status: AC
  Administered 2024-04-28: 5 mg via INTRAVENOUS
  Filled 2024-04-28: qty 2

## 2024-04-28 NOTE — Discharge Instructions (Addendum)
 I am prescribing a medicine called Flexeril  to help with any muscle spasm/headache.  Use this in conjunction with your other meds.  Follow-up with your primary care doctor.  Return if symptoms worsen.  Flexeril  is sedating so please be careful with its use.  Do not mix alcohol drugs or dangerous activities including driving.

## 2024-04-28 NOTE — ED Provider Notes (Signed)
 [facility] Provider Note   CSN: [id number] Arrival date & time: 04/28/24  1448     Patient presents with: Headache   Donna Guerra is a 73 y.o. female.   Patient here with gradually migraine headache for the last 5 or 6 days.  Sumatriptan  at home has not worked.  She has been wearing a cardiac monitor for palpitations.  She denies any weakness numbness tingling.  No vision loss.  No sudden headache.  No worst headache of her life.  She denies any fever nausea vomiting diarrhea.  Has been under stress.  Has been able to sleep.  History of melanoma, migraines.  The history is provided by the patient.       Prior to Admission medications   Medication Sig Start Date End Date Taking? Authorizing Provider  cyclobenzaprine  (FLEXERIL ) 5 MG tablet Take 1 tablet (5 mg total) by mouth 2 (two) times daily as needed for up to 10 doses for muscle spasms. 04/28/24  Yes Makailey Hodgkin, DO  atorvastatin  (LIPITOR) 40 MG tablet Take 1 tablet (40 mg total) by mouth daily. 04/08/24   Domenica Harlene LABOR, MD  Cholecalciferol (VITAMIN D  PO) Take 2,000 Units by mouth.    [provider]  fish oil-omega-3 fatty acids 1000 MG capsule Take 1 g by mouth daily.    [provider]  NONFORMULARY OR COMPOUNDED ITEM Estradiol  vaginal cream 0.02% 1 ml prefilled syringe, apply 1 ml per vagina twice weekly.  Disp:  28, RF 12.  [facility]. 04/15/24   Amundson C Silva, Brook E, MD  RETIN-A 0.1 % cream SMARTSIG:sparingly Topical Every Night 03/19/24   [provider]  SUMAtriptan  (IMITREX ) 100 MG tablet TAKE 1 TABLET BY MOUTH. MAY REPEAT IN 2 HOURS IF HEADACHE PERSISTS OR RECURS APPOINTMENT NEEDED FOR FURTHER REFILLS 05/19/23   Domenica Harlene LABOR, MD    Allergies: Patient has no known allergies.    Review of Systems  Updated Vital Signs BP 128/89   Pulse 64   Temp 97.6 F (36.4 C) (Oral)   Resp 14   Ht 5' 4.5 (1.638 m)   Wt 57.3 kg   SpO2  100%   BMI 21.36 kg/m   Physical Exam Vitals and nursing note reviewed.  Constitutional:      General: She is not in acute distress.    Appearance: She is well-developed. She is not ill-appearing.  HENT:     Head: Normocephalic and atraumatic.     Mouth/Throat:     Mouth: Mucous membranes are moist.  Eyes:     General: No scleral icterus.    Extraocular Movements: Extraocular movements intact.     Right eye: Normal extraocular motion and no nystagmus.     Left eye: Normal extraocular motion and no nystagmus.     Conjunctiva/sclera: Conjunctivae normal.     Pupils: Pupils are equal, round, and reactive to light. Pupils are equal.  Neck:     Meningeal: Brudzinski's sign and Kernig's sign absent.  Cardiovascular:     Rate and Rhythm: Normal rate and regular rhythm.     Heart sounds: No murmur heard. Pulmonary:     Effort: Pulmonary effort is normal. No respiratory distress.     Breath sounds: Normal breath sounds.  Abdominal:     Palpations: Abdomen is soft.     Tenderness: There is no abdominal tenderness.  Musculoskeletal:        General: No swelling.  Cervical back: Normal range of motion and neck supple.  Lymphadenopathy:     Cervical: No cervical adenopathy.  Skin:    General: Skin is warm and dry.     Capillary Refill: Capillary refill takes less than 2 seconds.  Neurological:     Mental Status: She is alert and oriented to person, place, and time.     Comments: 5+ out of 5 strength throughout, normal sensation, no drift, normal finger-nose-finger, normal speech  Psychiatric:        Mood and Affect: Mood normal.     (all labs ordered are listed, but only abnormal results are displayed) Labs Reviewed  COMPREHENSIVE METABOLIC PANEL WITH GFR - Abnormal; Notable for the following components:      Result Value   CO2 21 (*)    Glucose, Bld 154 (*)    All other components within normal limits  CBC WITH DIFFERENTIAL/PLATELET    EKG: None  Radiology: CT ANGIO  HEAD NECK W WO CM Result Date: 04/28/2024 EXAM: CT HEAD WITHOUT CTA HEAD AND NECK WITH AND WITHOUT 04/28/2024 04:55:53 PM TECHNIQUE: CTA of the head and neck was performed with and without the administration of intravenous contrast. Noncontrast CT of the head with reconstructed 2-D images are also provided for review. Multiplanar 2D and/or 3D reformatted images are provided for review. Automated exposure control, iterative reconstruction, and/or weight based adjustment of the mA/kV was utilized to reduce the radiation dose to as low as reasonably achievable. COMPARISON: None available CLINICAL HISTORY: Neuro deficit, acute, stroke suspected. FINDINGS: CT HEAD: BRAIN AND VENTRICLES: No acute intracranial hemorrhage. No mass effect. No midline shift. No extra-axial fluid collection. Gray-white differentiation is maintained. No hydrocephalus. ORBITS: No acute abnormality. SINUSES AND MASTOIDS: No acute abnormality. CTA NECK: AORTIC ARCH AND ARCH VESSELS: Atherosclerotic calcifications of the aortic arch and arch vessel origins, which remain patent. No dissection or arterial injury. No significant stenosis of the brachiocephalic or subclavian arteries. CERVICAL CAROTID ARTERIES: No dissection, arterial injury, or hemodynamically significant stenosis by NASCET criteria. CERVICAL VERTEBRAL ARTERIES: No dissection, arterial injury, or significant stenosis. LUNGS AND MEDIASTINUM: Unremarkable. SOFT TISSUES: No acute abnormality. BONES: No acute abnormality. CTA HEAD: ANTERIOR CIRCULATION: No significant stenosis of the internal carotid arteries. No significant stenosis of the anterior cerebral arteries. No significant stenosis of the middle cerebral arteries. No aneurysm. POSTERIOR CIRCULATION: No significant stenosis of the posterior cerebral arteries. No significant stenosis of the basilar artery. No significant stenosis of the vertebral arteries. No aneurysm. OTHER: No dural venous sinus thrombosis on this non-dedicated  study. IMPRESSION: 1. No large vessel occlusion or hemodynamically significant stenosis in the head or neck vessels. 2. Atherosclerotic calcifications of the aortic arch and arch vessel origins, which remain patent. Electronically signed by: Ryan Chess MD 04/28/2024 05:05 PM EDT RP Workstation: [id number]     Procedures   Medications Ordered in the ED  dexamethasone  (DECADRON ) injection 10 mg (has no administration in time range)  ketorolac  (TORADOL ) 15 MG/ML injection 15 mg (has no administration in time range)  prochlorperazine  (COMPAZINE ) injection 5 mg (5 mg Intravenous Given 04/28/24 1525)  diphenhydrAMINE  (BENADRYL ) injection 12.5 mg (12.5 mg Intravenous Given 04/28/24 1525)  sodium chloride  0.9 % bolus 1,000 mL (0 mLs Intravenous Stopped 04/28/24 1642)  diphenhydrAMINE  (BENADRYL ) injection 12.5 mg (12.5 mg Intravenous Given 04/28/24 1617)  magnesium  sulfate IVPB 2 g 50 mL (0 g Intravenous Stopped 04/28/24 1642)  iohexol  (OMNIPAQUE ) 350 MG/ML injection 100 mL (75 mLs Intravenous Contrast Given 04/28/24 1648)  Medical Decision Making Amount and/or Complexity of Data Reviewed Labs: ordered. Radiology: ordered.  Risk Prescription drug management.   Sharyne KATHEE Masker is here with migraine.  Normal vitals.  No fever.  History of migraines.  Wearing cardiac monitor/Zio patch for palpitations recently.  Looks like she has got sinus tachycardia on EKG with some PACs.  No chest pain no shortness of breath.  She is neurologically intact.  Differential diagnosis likely migraine headache but will evaluate with CTA of the head and neck to evaluate for any acute intracranial process.  Will check basic labs.  Will give headache cocktail with Benadryl  Compazine  fluid bolus.  Headache for the last 5 or 6 days resistant to sumatriptan .  I do not think that she has thrombosis or stroke.  Lab work for my review interpretation is unremarkable.  There is no significant  leukocytosis anemia or electrolyte abnormality.  CTA of the head and neck are unremarkable per radiology report.  Headaches improved.  Will prescribe Flexeril  to take as needed as I do think there could be a muscular component to this.  Follow-up with primary care doctor peer return if symptoms worsen.  This chart was dictated using voice recognition software.  Despite best efforts to proofread,  errors can occur which can change the documentation meaning.      Final diagnoses:  Migraine without aura and without status migrainosus, not intractable    ED Discharge Orders          Ordered    cyclobenzaprine  (FLEXERIL ) 5 MG tablet  2 times daily PRN        04/28/24 1713               Ruthe Cornet, DO 04/28/24 1714

## 2024-04-28 NOTE — ED Triage Notes (Signed)
 Pt reports HA since Tues; home migraine medications are not helping; currently on continuous heart monitoring for palpitations via her PCP

## 2024-04-28 NOTE — ED Notes (Signed)
 ED Provider at bedside.

## 2024-04-29 ENCOUNTER — Encounter: Admitting: Obstetrics and Gynecology

## 2024-05-13 ENCOUNTER — Telehealth: Payer: Self-pay | Admitting: *Deleted

## 2024-05-13 NOTE — Telephone Encounter (Signed)
 Copied from CRM [id number]. Topic: Clinical - Medical Advice >> May 13, 2024  3:52 PM Aisha D wrote: Reason for CRM: Pt is requesting to speak with Dr.Blyth regarding medical advice for her headaches. Pt would like to know what Dr.Blyth recommends or if she can refer her to a specialist. Pt would like a callback with an update.

## 2024-05-14 ENCOUNTER — Ambulatory Visit: Payer: Self-pay

## 2024-05-14 ENCOUNTER — Encounter: Payer: Self-pay | Admitting: Family Medicine

## 2024-05-14 ENCOUNTER — Ambulatory Visit (INDEPENDENT_AMBULATORY_CARE_PROVIDER_SITE_OTHER): Admitting: Family Medicine

## 2024-05-14 VITALS — BP 120/64 | HR 85 | Temp 97.5°F | Ht 64.5 in | Wt 126.0 lb

## 2024-05-14 DIAGNOSIS — M542 Cervicalgia: Secondary | ICD-10-CM | POA: Diagnosis not present

## 2024-05-14 DIAGNOSIS — G43809 Other migraine, not intractable, without status migrainosus: Secondary | ICD-10-CM

## 2024-05-14 DIAGNOSIS — G43711 Chronic migraine without aura, intractable, with status migrainosus: Secondary | ICD-10-CM

## 2024-05-14 MED ORDER — QULIPTA 10 MG PO TABS: ORAL_TABLET | ORAL | 5 refills | Status: DC

## 2024-05-14 MED ORDER — SUMATRIPTAN SUCCINATE 100 MG PO TABS: ORAL_TABLET | ORAL | 11 refills | Status: DC

## 2024-05-14 MED ORDER — KETOROLAC TROMETHAMINE 60 MG/2ML IM SOLN
60.0000 mg | Freq: Once | INTRAMUSCULAR | Status: AC
  Administered 2024-05-14: 60 mg via INTRAMUSCULAR

## 2024-05-14 NOTE — Telephone Encounter (Signed)
 APPOINTMENT MADE FOR TODAY 05/14/2024 AT 2:40 PM TO SEE Donna Guerra   FYI Only or Action Required?: FYI only for provider.  Patient was last seen in primary care on 04/23/2024 by Amon Aloysius BRAVO, MD.  Called Nurse Triage reporting Migraine.  Symptoms began several weeks ago.  Interventions attempted: Prescription medications: Migraine Medication, Rest, hydration, or home remedies, and Ice/heat application.  Symptoms are: unchanged.  Triage Disposition: See PCP Within 2 Weeks  Patient/caregiver understands and will follow disposition?: Yes                       Copied from CRM [id number]. Topic: Clinical - Red Word Triage >> May 14, 2024 10:44 AM Donna Guerra wrote: Red Word that prompted transfer to Nurse Triage: Patient is experiencing migraines daily and believe she may need to see a neurologist. The pain goes from her neck and starts a migraine. Reason for Disposition  Headache is a chronic symptom (recurrent or ongoing AND present > 4 weeks)    Patient took her migraine medication and does not have a headache at the time of triage  Answer Assessment - Initial Assessment Questions X 3 or more weeks Headache every day Start in neck Radiates up into head  History of migraines   Went to ER CT was done Patient told nothing significant found  Went to [facility] That didn't help much either  So patient wanted to come in to discuss possible referrals to ENT or Neurology or her PCP office help her figure out a cause to her having a headache every single day              1. LOCATION: Where does it hurt?      Back of neck into head 2. ONSET: When did the headache start? (e.g., minutes, hours, days)      3 or more weeks ago 3. PATTERN: Does the pain come and go, or has it been constant since it started?     Comes every single day 4. SEVERITY: How bad is the pain? and What does it keep you from doing?  (e.g., Scale 1-10; mild,  moderate, or severe)     Fine---after a migraine pill      when it hurts it is a 10 out of 10 5. RECURRENT SYMPTOM: Have you ever had headaches before? If Yes, ask: When was the last time? and What happened that time?      Yes hx of migraines 6. CAUSE: What Guerra you think is causing the headache?     unsure 7. MIGRAINE: Have you been diagnosed with migraine headaches? If Yes, ask: Is this headache similar?      Yes---this is different 8. HEAD INJURY: Has there been any recent injury to your head?      No 9. OTHER SYMPTOMS: Guerra you have any other symptoms? (e.g., fever, stiff neck, eye pain, sore throat, cold symptoms)     Patient feels like left nostril is clogged a lot and left side of neck hurts more than the right  Protocols used: Headache-A-AH

## 2024-05-14 NOTE — Progress Notes (Signed)
 Subjective:    Patient ID: Donna Guerra, female    DOB: 1950-12-20, 73 y.o.   MRN: [id number]  Chief Complaint  Patient presents with   Headache    Still suffering from bad headaches and migraines almost every day; asking for referral to Neuro or speciality to help figure out what's going on   HPI Patient is in today for migraines.  Discussed the use of AI scribe software for clinical note transcription with the patient, who gave verbal consent to proceed.  History of Present Illness Donna Guerra is a 73 year old female with migraines who presents with persistent daily headaches.  She has a long-standing history of migraines, typically lasting two to three days, managed with sumatriptan . Since July 7th, she has experienced a persistent headache daily, beginning in her neck and radiating upwards, causing severe pain. She takes sumatriptan  daily, but relief is temporary, and the headache returns once the medication wears off.  On July 13th, she visited the emergency room due to the severity of the headache. She received an IV, steroids, and a migraine medication injection, but these treatments did not alleviate her symptoms. A CT scan showed no abnormalities in the veins of her head and brain. Despite this, she left the ER with persistent neck pain and continued headaches.  She has tried over-the-counter medications like Tylenol and Advil  without relief. A chiropractor performed an X-ray and noted minimal space between the C3, C4, and C5 vertebrae, but chiropractic adjustments did not help. She reports a knot on the right side of her neck, which was more prominent at one time.  No aura with migraines, numbness, or tingling in hands. She has a family history of migraines, as her father also suffered from them. She takes sumatriptan , one pill early in the morning due to the severity of the pain, but is concerned about the limited supply of nine pills per month.  She has a history of a  possible whiplash injury from a roller coaster incident years ago, which may have contributed to her neck issues. She reports difficulty breathing through one nostril, raising concerns about sinus involvement, although she does not have congestion or sinus pressure.   Past Medical History:  Diagnosis Date   Back pain 04/11/2017   CIN I (cervical intraepithelial neoplasia I) 2005   Colon polyps 07/08/2012   Hyperglycemia 06/13/2017   Hyperlipidemia 08/29/2012   Melanoma in situ of right shoulder (HCC) 03/17/2020   Migraines    Osteopenia 03/2018   T score -1.6 FRAX 11% / 2.9% stable from prior DEXA   Right shoulder pain 07/08/2012   Skin cancer 07/08/2012   Basal cell    Past Surgical History:  Procedure Laterality Date   ABDOMINAL SURGERY     Tummy tuck   CESAREAN SECTION  1978 and 1981   CHOLECYSTECTOMY     COLPOSCOPY     COSMETIC SURGERY     GYNECOLOGIC CRYOSURGERY     OOPHORECTOMY  2002   BSO   SHOULDER SURGERY     right rotator cuff repair   TONSILLECTOMY AND ADENOIDECTOMY     age 70   TUBAL LIGATION     VAGINAL HYSTERECTOMY  2002   LAVH BSO    Family History  Problem Relation Age of Onset   Hypertension Mother    Breast cancer Mother 49   Colon cancer Mother 52   Migraines Father    Migraines Sister    Cancer Sister  Melanoma   Breast cancer Sister 55   Cirrhosis Sister    Breast cancer Maternal Aunt 48   Breast cancer Maternal Aunt        50's   Breast cancer Paternal Aunt        dx over 66   Diabetes Maternal Grandmother    Prostate cancer Cousin        maternal first cousin   Colon cancer Cousin        maternal first cousin   Cancer Cousin        unknown cancer   Breast cancer Cousin 36       TN breast cancer - maternal first cousin   Breast cancer Niece 37       DCIS    Social History   Socioeconomic History   Marital status: Married    Spouse name: Not on file   Number of children: Not on file   Years of education: Not on file    Highest education level: Not on file  Occupational History   Not on file  Tobacco Use   Smoking status: Never   Smokeless tobacco: Never  Vaping Use   Vaping status: Never Used  Substance and Sexual Activity   Alcohol use: Not Currently    Comment: 2 drinks/month   Drug use: No   Sexual activity: Yes    Partners: Male    Birth control/protection: Surgical    Comment: Hyst, Less than 5, after 16, no STD, no DES  Other Topics Concern   Not on file  Social History Narrative   Not on file   Social Drivers of Health   Financial Resource Strain: Not on file  Food Insecurity: Not on file  Transportation Needs: Not on file  Physical Activity: Not on file  Stress: Not on file  Social Connections: Not on file  Intimate Partner Violence: Not on file    Outpatient Medications Prior to Visit  Medication Sig Dispense Refill   atorvastatin  (LIPITOR) 40 MG tablet Take 1 tablet (40 mg total) by mouth daily. 90 tablet 1   Cholecalciferol (VITAMIN D  PO) Take 2,000 Units by mouth.     fish oil-omega-3 fatty acids 1000 MG capsule Take 1 g by mouth daily.     NONFORMULARY OR COMPOUNDED ITEM Estradiol  vaginal cream 0.02% 1 ml prefilled syringe, apply 1 ml per vagina twice weekly.  Disp:  28, RF 12.  [facility]. 28 each 12   RETIN-A 0.1 % cream SMARTSIG:sparingly Topical Every Night     SUMAtriptan  (IMITREX ) 100 MG tablet TAKE 1 TABLET BY MOUTH. MAY REPEAT IN 2 HOURS IF HEADACHE PERSISTS OR RECURS APPOINTMENT NEEDED FOR FURTHER REFILLS 9 tablet 11   cyclobenzaprine  (FLEXERIL ) 5 MG tablet Take 1 tablet (5 mg total) by mouth 2 (two) times daily as needed for up to 10 doses for muscle spasms. (Patient not taking: Reported on 05/14/2024) 10 tablet 0   No facility-administered medications prior to visit.    No Known Allergies  Review of Systems  Constitutional:  Negative for fever and malaise/fatigue.  HENT:  Negative for congestion.   Eyes:  Negative for blurred vision.   Respiratory:  Negative for shortness of breath.   Cardiovascular:  Negative for chest pain, palpitations and leg swelling.  Gastrointestinal:  Negative for abdominal pain, blood in stool and nausea.  Genitourinary:  Negative for dysuria and frequency.  Musculoskeletal:  Positive for neck pain. Negative for falls.  Skin:  Negative for rash.  Neurological:  Positive for headaches. Negative for dizziness and loss of consciousness.  Endo/Heme/Allergies:  Negative for environmental allergies.  Psychiatric/Behavioral:  Negative for depression. The patient is not nervous/anxious.        Objective:    Physical Exam Vitals and nursing note reviewed.  Constitutional:      General: She is not in acute distress.    Appearance: Normal appearance. She is well-developed.  HENT:     Head: Normocephalic and atraumatic.  Eyes:     General: No visual field deficit or scleral icterus.       Right eye: No discharge.        Left eye: No discharge.  Cardiovascular:     Rate and Rhythm: Normal rate and regular rhythm.     Heart sounds: No murmur heard. Pulmonary:     Effort: Pulmonary effort is normal. No respiratory distress.     Breath sounds: Normal breath sounds.  Musculoskeletal:        General: Tenderness present.     Cervical back: Neck supple. Tenderness and bony tenderness present. No edema or spasms. Pain with movement present. Decreased range of motion.     Right lower leg: No edema.     Left lower leg: No edema.  Skin:    General: Skin is warm and dry.  Neurological:     Mental Status: She is alert and oriented to person, place, and time.     Cranial Nerves: No cranial nerve deficit, dysarthria or facial asymmetry.     Sensory: No sensory deficit.     Motor: No weakness.     Coordination: Romberg sign negative. Coordination normal.     Gait: Gait normal.  Psychiatric:        Mood and Affect: Mood normal.        Behavior: Behavior normal.        Thought Content: Thought content  normal.        Judgment: Judgment normal.     BP 120/64   Pulse 85   Temp (!) 97.5 F (36.4 C)   Ht 5' 4.5 (1.638 m)   Wt 126 lb (57.2 kg)   SpO2 98%   BMI 21.29 kg/m  Wt Readings from Last 3 Encounters:  05/14/24 126 lb (57.2 kg)  04/28/24 126 lb 6.4 oz (57.3 kg)  04/23/24 126 lb 6.4 oz (57.3 kg)    Diabetic Foot Exam - Simple   No data filed    Lab Results  Component Value Date   WBC 9.4 04/28/2024   HGB 13.7 04/28/2024   HCT 39.0 04/28/2024   PLT 268 04/28/2024   GLUCOSE 154 (H) 04/28/2024   CHOL 158 07/06/2023   TRIG 107.0 07/06/2023   HDL 60.00 07/06/2023   LDLCALC 77 07/06/2023   ALT 27 04/28/2024   AST 24 04/28/2024   NA 140 04/28/2024   K 4.0 04/28/2024   CL 105 04/28/2024   CREATININE 0.76 04/28/2024   BUN 18 04/28/2024   CO2 21 (L) 04/28/2024   TSH 1.83 04/23/2024   HGBA1C 5.9 07/06/2023    Lab Results  Component Value Date   TSH 1.83 04/23/2024   Lab Results  Component Value Date   WBC 9.4 04/28/2024   HGB 13.7 04/28/2024   HCT 39.0 04/28/2024   MCV 95.6 04/28/2024   PLT 268 04/28/2024   Lab Results  Component Value Date   NA 140 04/28/2024   K 4.0 04/28/2024   CO2 21 (L) 04/28/2024  GLUCOSE 154 (H) 04/28/2024   BUN 18 04/28/2024   CREATININE 0.76 04/28/2024   BILITOT 0.7 04/28/2024   ALKPHOS 85 04/28/2024   AST 24 04/28/2024   ALT 27 04/28/2024   PROT 6.8 04/28/2024   ALBUMIN 4.4 04/28/2024   CALCIUM  9.4 04/28/2024   ANIONGAP 14 04/28/2024   GFR 75.67 04/23/2024   Lab Results  Component Value Date   CHOL 158 07/06/2023   Lab Results  Component Value Date   HDL 60.00 07/06/2023   Lab Results  Component Value Date   LDLCALC 77 07/06/2023   Lab Results  Component Value Date   TRIG 107.0 07/06/2023   Lab Results  Component Value Date   CHOLHDL 3 07/06/2023   Lab Results  Component Value Date   HGBA1C 5.9 07/06/2023       Assessment & Plan:  Chronic migraine without aura, with intractable migraine, so  stated, with status migrainosus -     MR BRAIN W WO CONTRAST; Future -     Ambulatory referral to Neurology -     Qulipta ; 1 po every day  Dispense: 30 tablet; Refill: 5 -     Ketorolac  Tromethamine   Other migraine without status migrainosus, not intractable -     SUMAtriptan  Succinate; TAKE 1 TABLET BY MOUTH. MAY REPEAT IN 2 HOURS IF HEADACHE PERSISTS OR RECURS APPOINTMENT NEEDED FOR FURTHER REFILLS  Dispense: 9 tablet; Refill: 11 -     CBC with Differential/Platelet -     Comprehensive metabolic panel with GFR -     Sedimentation rate  Neck pain -     MR CERVICAL SPINE WO CONTRAST; Future  Assessment and Plan Assessment & Plan Chronic migraine without aura   Chronic migraines have persisted since July 7th, with daily headaches unrelieved by ER treatment. Sumatriptan  provides temporary relief, but the supply is limited and headaches are prolonged. Pain originates in the neck and radiates to the head, mainly on the left side. There is no aura. Differential diagnosis includes cervical spine issues or sinus-related causes. A family history of migraines is noted. An MRI and neurology referral are necessary to rule out other causes and explore preventative treatments. Order an MRI of the brain and neck. Refer to neurology for further evaluation. Prescribe Qulipta  daily as a preventative medication. Continue sumatriptan  as needed for acute relief. Administer a Toradol  injection for immediate pain relief. Check sed rate and perform additional blood work.  Cervicalgia   Neck pain may contribute to chronic migraines. A chiropractor's X-ray showed reduced space between cervical vertebrae C3-C5, possibly affecting nerves. Previous chiropractic adjustments did not alleviate symptoms. There is no numbness or tingling in the arms, suggesting no acute nerve compression. An MRI will help assess for structural issues. Order an MRI of the neck. Consider referral to a specialist if the MRI indicates cervical  spine pathology. Advise a trial of Flexeril  (cyclobenzaprine ) at night for muscle relaxation.    Dawne Casali R Lowne Chase, DO

## 2024-05-15 LAB — COMPREHENSIVE METABOLIC PANEL WITH GFR
ALT: 28 U/L (ref 0–35)
AST: 23 U/L (ref 0–37)
Albumin: 4.8 g/dL (ref 3.5–5.2)
Alkaline Phosphatase: 69 U/L (ref 39–117)
BUN: 15 mg/dL (ref 6–23)
CO2: 32 meq/L (ref 19–32)
Calcium: 10.5 mg/dL (ref 8.4–10.5)
Chloride: 102 meq/L (ref 96–112)
Creatinine, Ser: 0.7 mg/dL (ref 0.40–1.20)
GFR: 84.8 mL/min (ref >60.00)
Glucose, Bld: 110 mg/dL — ABNORMAL HIGH (ref 70–99)
Potassium: 4.3 meq/L (ref 3.5–5.1)
Sodium: 142 meq/L (ref 135–145)
Total Bilirubin: 0.7 mg/dL (ref 0.2–1.2)
Total Protein: 7.2 g/dL (ref 6.0–8.3)

## 2024-05-15 LAB — CBC WITH DIFFERENTIAL/PLATELET
Basophils Absolute: 0.1 K/uL (ref 0.0–0.1)
Basophils Relative: 0.9 % (ref 0.0–3.0)
Eosinophils Absolute: 0.2 K/uL (ref 0.0–0.7)
Eosinophils Relative: 2.4 % (ref 0.0–5.0)
HCT: 41.9 % (ref 36.0–46.0)
Hemoglobin: 14.5 g/dL (ref 12.0–15.0)
Lymphocytes Relative: 31.2 % (ref 12.0–46.0)
Lymphs Abs: 2.5 K/uL (ref 0.7–4.0)
MCHC: 34.6 g/dL (ref 30.0–36.0)
MCV: 96.9 fl (ref 78.0–100.0)
Monocytes Absolute: 0.5 K/uL (ref 0.1–1.0)
Monocytes Relative: 5.9 % (ref 3.0–12.0)
Neutro Abs: 4.8 K/uL (ref 1.4–7.7)
Neutrophils Relative %: 59.6 % (ref 43.0–77.0)
Platelets: 271 K/uL (ref 150.0–400.0)
RBC: 4.32 Mil/uL (ref 3.87–5.11)
RDW: 12.9 % (ref 11.5–15.5)
WBC: 8.1 K/uL (ref 4.0–10.5)

## 2024-05-15 LAB — SEDIMENTATION RATE: Sed Rate: 4 mm/h (ref 0–30)

## 2024-05-15 NOTE — Telephone Encounter (Signed)
 Patient reports she was in yesterday,05/14/24 with Dr. Cruz.

## 2024-05-16 ENCOUNTER — Ambulatory Visit: Payer: Self-pay | Admitting: Family

## 2024-05-21 ENCOUNTER — Telehealth: Payer: Self-pay

## 2024-05-21 ENCOUNTER — Other Ambulatory Visit: Payer: Self-pay | Admitting: Family Medicine

## 2024-05-21 DIAGNOSIS — G43711 Chronic migraine without aura, intractable, with status migrainosus: Secondary | ICD-10-CM

## 2024-05-21 MED ORDER — AIMOVIG 70 MG/ML ~~LOC~~ SOAJ: SUBCUTANEOUS | 5 refills | Status: DC

## 2024-05-21 NOTE — Telephone Encounter (Unsigned)
 Copied from CRM [id number]. Topic: Clinical - Prescription Issue >> May 21, 2024  3:38 PM Chasity T wrote: Reason for CRM: Patient is calling in regarding medication Atogepant  (QULIPTA ) 10 MG TABS. States that the pharmacy has contacted her to let her know that insurance will not pay for medication and to request for another medication for headaches. Please contact patient back regarding medication.   She did see dr. Cruz for the medication on 05/14/24

## 2024-05-22 ENCOUNTER — Ambulatory Visit
Admission: RE | Admit: 2024-05-22 | Discharge: 2024-05-22 | Disposition: A | Discharge status: Home / Self Care | Source: Ambulatory Visit | Attending: Family Medicine | Admitting: Family Medicine

## 2024-05-22 DIAGNOSIS — G43711 Chronic migraine without aura, intractable, with status migrainosus: Secondary | ICD-10-CM

## 2024-05-22 DIAGNOSIS — M542 Cervicalgia: Secondary | ICD-10-CM

## 2024-05-22 MED ORDER — GADOPICLENOL 0.5 MMOL/ML IV SOLN
6.0000 mL | Freq: Once | INTRAVENOUS | Status: AC | PRN
  Administered 2024-05-22: 6 mL via INTRAVENOUS

## 2024-05-23 ENCOUNTER — Telehealth: Payer: Self-pay | Admitting: Family Medicine

## 2024-05-23 ENCOUNTER — Other Ambulatory Visit (HOSPITAL_COMMUNITY): Payer: Self-pay

## 2024-05-23 ENCOUNTER — Encounter: Payer: Self-pay | Admitting: Cardiology

## 2024-05-23 ENCOUNTER — Other Ambulatory Visit: Payer: Self-pay | Admitting: Family Medicine

## 2024-05-23 ENCOUNTER — Ambulatory Visit: Attending: Cardiology | Admitting: Cardiology

## 2024-05-23 VITALS — BP 108/70 | HR 78 | Ht 64.5 in | Wt 124.0 lb

## 2024-05-23 DIAGNOSIS — I480 Paroxysmal atrial fibrillation: Secondary | ICD-10-CM | POA: Diagnosis not present

## 2024-05-23 DIAGNOSIS — M4805 Spinal stenosis, thoracolumbar region: Secondary | ICD-10-CM

## 2024-05-23 DIAGNOSIS — M4803 Spinal stenosis, cervicothoracic region: Secondary | ICD-10-CM

## 2024-05-23 DIAGNOSIS — R002 Palpitations: Secondary | ICD-10-CM

## 2024-05-23 DIAGNOSIS — G43809 Other migraine, not intractable, without status migrainosus: Secondary | ICD-10-CM | POA: Diagnosis not present

## 2024-05-23 DIAGNOSIS — I484 Atypical atrial flutter: Secondary | ICD-10-CM | POA: Diagnosis not present

## 2024-05-23 DIAGNOSIS — M4802 Spinal stenosis, cervical region: Secondary | ICD-10-CM

## 2024-05-23 MED ORDER — DILTIAZEM HCL 30 MG PO TABS
30.0000 mg | ORAL_TABLET | ORAL | 4 refills | Status: AC | PRN
  Filled 2024-05-23: qty 30, 5d supply, fill #0

## 2024-05-23 MED ORDER — APIXABAN 5 MG PO TABS
5.0000 mg | ORAL_TABLET | Freq: Two times a day (BID) | ORAL | 3 refills | Status: DC
  Filled 2024-05-23: qty 60, 30d supply, fill #0
  Filled 2024-05-23: qty 180, 90d supply, fill #0

## 2024-05-23 NOTE — Progress Notes (Signed)
 Cardiology Office Note:  .   Date:  05/23/2024  ID:  Donna Guerra, DOB Aug 12, 1951, MRN [id number] PCP: Domenica Harlene LABOR, MD  [facility] Providers Cardiologist:  None     History of Present Illness: .   Donna Guerra is a 73 y.o. female Discussed the use of AI scribe software for clinical note transcription with the patient, who gave verbal consent to proceed.  History of Present Illness Donna Guerra is a 73 year old female with atrial flutter who presents for evaluation of her condition. She was referred by Dr. Amon for evaluation of atrial flutter.  She has been experiencing symptoms of fluttering and racing heartbeats for about a year, occurring several times a day. These episodes are associated with sinus rhythm and frequent premature atrial contractions (PACs). Her Xio monitor demonstrated four percent atrial flutter with rapid ventricular response, and she is generally in sinus rhythm with sinus bradycardia at 46 beats per minute. She has experienced irregular heartbeats as indicated by her home blood pressure machine.  She has a history of hyperlipidemia, managed with atorvastatin  40 mg daily, and occasional migraines for which she uses Imitrex . She eliminated caffeine from her diet and has not felt the racing heart episodes since, which previously reached up to 140 beats per minute.  No fainting or chest pain, but she can feel her heart racing. No family history of heart problems; her father lived to 29 without medication. She does not smoke and only drinks alcohol occasionally. She is currently experiencing daily headaches, which are only relieved by her migraine medication, and she is awaiting further evaluation by a neurologist.      Studies Reviewed: .        Results LABS Creatinine: 0.7 mg/dL ALT: 28 U/L Potassium: 4.3 mmol/L Hemoglobin: 14.5 g/dL Hemoglobin J8r: 4.0%  DIAGNOSTIC Xayo monitor: 4% atrial flutter with rapid ventricular response Risk  Assessment/Calculations:    CHA2DS2-VASc Score = 3   This indicates a 3.2% annual risk of stroke. The patient's score is based upon: CHF History: 0 HTN History: 0 Diabetes History: 0 Stroke History: 0 Vascular Disease History: 0 Age Score: 2 Gender Score: 1            Physical Exam:   VS:  BP 108/70   Pulse 78   Ht 5' 4.5 (1.638 m)   Wt 124 lb (56.2 kg)   SpO2 97%   BMI 20.96 kg/m    Wt Readings from Last 3 Encounters:  05/23/24 124 lb (56.2 kg)  05/14/24 126 lb (57.2 kg)  04/28/24 126 lb 6.4 oz (57.3 kg)    GEN: Well nourished, well developed in no acute distress NECK: No JVD; No carotid bruits CARDIAC: RRR, no murmurs, no rubs, no gallops RESPIRATORY:  Clear to auscultation without rales, wheezing or rhonchi  ABDOMEN: Soft, non-tender, non-distended EXTREMITIES:  No edema; No deformity   ASSESSMENT AND PLAN: .    Assessment and Plan Assessment & Plan Atrial flutter and paroxysmal atrial fibrillation with rapid ventricular response New diagnosis of atrial flutter with rapid ventricular response, confirmed by [facility], [place] monitor showing 4% atrial flutter.  There were also some beats that had some mild irregularity to them.  Symptoms include palpitations and tachycardia, occurring several times a day for about a year, improved with cessation of caffeine. Discussed stroke risk associated with atrial flutter and need for anticoagulation. Consideration of Watchman device as an alternative to long-term anticoagulation. Discussed potential for ablation procedure to address arrhythmia,  potentially beneficial for her small burden of atrial flutter. - Start Eliquis  5 mg twice daily for stroke prevention. - Order echocardiogram to assess heart function and valve status. - Refer to electrophysiologist to discuss potential for Watchman device and ablation procedure. - She is currently avoiding caffeine and her symptoms seem to have improved.  With her average heart rate of 58, there may not  be much room for medications.  We will also give her short acting diltiazem  30 mg as needed every 4 hours with tachycardia. -TSH is normal  Frequent premature atrial contractions (PACs) Frequent PACs noted on monitor, associated with palpitations and tachycardia. Symptoms have been present intermittently for about a year.  Hyperlipidemia Currently managed with atorvastatin  40 mg daily.  Frequent migraines every day Has some cervical spinal disease as well.  If procedure is needed, Eliquis  will need to be on hold.  She may proceed.         Dispo: 6 month APP, EP soon  Signed, Oneil Parchment, MD

## 2024-05-23 NOTE — Telephone Encounter (Signed)
 Referral placed.

## 2024-05-23 NOTE — Patient Instructions (Addendum)
 Medication Instructions:  Please start Eliquis  5 mg one tablet twice a day. May take Diltiazem  30 mg one tablet every 4 hours as needed for rapid heart rate. Continue all other medications as listed.  *If you need a refill on your cardiac medications before your next appointment, please call your pharmacy*  Testing/Procedures: .Your physician has requested that you have an echocardiogram. Echocardiography is a painless test that uses sound waves to create images of your heart. It provides your doctor with information about the size and shape of your heart and how well your heart's chambers and valves are working. This procedure takes approximately one hour. There are no restrictions for this procedure. Please do NOT wear cologne, perfume, aftershave, or lotions (deodorant is allowed). Please arrive 15 minutes prior to your appointment time.  Please note: We ask at that you not bring children with you during ultrasound (echo/ vascular) testing. Due to room size and safety concerns, children are not allowed in the ultrasound rooms during exams. Our front office staff cannot provide observation of children in our lobby area while testing is being conducted. An adult accompanying a patient to their appointment will only be allowed in the ultrasound room at the discretion of the ultrasound technician under special circumstances. We apologize for any inconvenience.  You have been referred to Electrophysiology to discuss Watchman and possible ablation.  Follow-Up: At [facility], you and your health needs are our priority.  As part of our continuing mission to provide you with exceptional heart care, our providers are all part of one team.  This team includes your primary Cardiologist (physician) and Advanced Practice Providers or APPs (Physician Assistants and Nurse Practitioners) who all work together to provide you with the care you need, when you need it.  Your next appointment:   6  month(s)  Provider:   One of our Advanced Practice Providers (APPs): Morse Clause, PA-C  Lamarr Satterfield, NP Miriam Shams, NP  Olivia Pavy, PA-C Josefa Beauvais, NP  Leontine Salen, PA-C Orren Fabry, PA-C  [place], PA-C Ernest Dick, NP  Damien Braver, NP Jon Hails, PA-C  Waddell Donath, PA-C    Dayna Dunn, PA-C  Scott Weaver, PA-C Lum Louis, NP Katlyn West, NP Callie Goodrich, PA-C  Evan Williams, PA-C Sheng Haley, PA-C  Xika Zhao, NP Kathleen Johnson, PA-C       We recommend signing up for the patient portal called MyChart.  Sign up information is provided on this After Visit Summary.  MyChart is used to connect with patients for Virtual Visits (Telemedicine).  Patients are able to view lab/test results, encounter notes, upcoming appointments, etc.  Non-urgent messages can be sent to your provider as well.   To learn more about what you can do with MyChart, go to [email or web address].   Atrial Fibrillation Atrial fibrillation (AFib) is a type of heartbeat that is irregular or fast. If you have AFib, your heart beats without any order. This makes it hard for your heart to pump blood in a normal way. AFib may come and go, or it may become a long-lasting problem. If AFib is not treated, it can put you at higher risk for stroke, heart failure, and other heart problems. What are the causes? AFib may be caused by diseases that damage the heart's electrical system. They include: High blood pressure. Heart failure. Heart valve diseases. Heart surgery. Diabetes. Thyroid  disease. Kidney disease. Lung diseases, such as pneumonia or COPD. Sleep apnea. Sometimes the cause is not known. What  increases the risk? You are more likely to develop AFib if: You are older. You exercise often and very hard. You have a family history of AFib. You are female. You are Caucasian. You are overweight. You smoke. You drink a lot of alcohol. What are the signs or symptoms? Common  symptoms of this condition include: A feeling that your heart is beating very fast. Chest pain or discomfort. Feeling short of breath. Suddenly feeling light-headed or weak. Getting tired easily during activity. Fainting. Sweating. In some cases, there are no symptoms. How is this treated? Medicines to: Prevent blood clots. Treat heart rate or heart rhythm problems. Using devices, such as a pacemaker, to correct heart rhythm problems. Doing surgery to remove the part of the heart that sends bad signals. Closing an area where clots can form in the heart (left atrial appendage). In some cases, your doctor will treat other underlying conditions. Follow these instructions at home: Medicines Take over-the-counter and prescription medicines only as told by your doctor. Do not take any new medicines without first talking to your doctor. If you are taking blood thinners: Talk with your doctor before taking aspirin or NSAIDs, such as ibuprofen . Take your medicines as told. Take them at the same time each day. Do not do things that could hurt or bruise you. Be careful to avoid falls. Wear an alert bracelet or carry a card that says you take blood thinners. Lifestyle Do not smoke or use any products that contain nicotine or tobacco. If you need help quitting, ask your doctor. Eat heart-healthy foods. Talk with your doctor about the right eating plan for you. Exercise regularly as told by your doctor. Do not drink alcohol. Lose weight if you are overweight. General instructions If you have sleep apnea, treat it as told by your doctor. Do not use diet pills unless your doctor says they are safe for you. Diet pills may make heart problems worse. Keep all follow-up visits. Your doctor will check your heart rate and rhythm regularly. Contact a doctor if: You notice a change in the speed, rhythm, or strength of your heartbeat. You are taking a blood-thinning medicine and you get more  bruising. You get tired more easily when you move or exercise. You have a sudden change in weight. Get help right away if:  You have pain in your chest. You have trouble breathing. You have side effects of blood thinners, such as blood in your vomit, poop (stool), or pee (urine), or bleeding that cannot stop. You have any signs of a stroke. BE FAST is an easy way to remember the main warning signs: B - Balance. Dizziness, sudden trouble walking, or loss of balance. E - Eyes. Trouble seeing or a change in how you see. F - Face. Sudden weakness or loss of feeling in the face. The face or eyelid may droop on one side. A - Arms.Weakness or loss of feeling in an arm. This happens suddenly and usually on one side of the body. S - Speech. Sudden trouble speaking, slurred speech, or trouble understanding what people say. T - Time.Time to call emergency services. Write down what time symptoms started. You have other signs of a stroke, such as: A sudden, very bad headache with no known cause. Feeling like you may vomit (nausea). Vomiting. A seizure. These symptoms may be an emergency. Get help right away. Call 911. Do not wait to see if the symptoms will go away. Do not drive yourself to the hospital. This  information is not intended to replace advice given to you by your health care provider. Make sure you discuss any questions you have with your health care provider. Document Revised: 06/22/2022 Document Reviewed: 06/22/2022 Elsevier Patient Education  2024 [employer].

## 2024-05-23 NOTE — Telephone Encounter (Signed)
 Donna Guerra reports that another MRI needs to be completed due to: 6. 6 mm lesion within the C7 vertebral body. While this could reflect an atypical hemangioma, the imaging features are nonspecific and this lesion remains indeterminate. Consider a follow-up MRI to ensure stability.

## 2024-05-23 NOTE — Telephone Encounter (Unsigned)
 Copied from CRM [id number]. Topic: Clinical - Lab/Test Results >> May 23, 2024  9:32 AM Gennette ORN wrote: Reason for CRM: [facility] [phone] is calling because has results for the patient.

## 2024-05-24 ENCOUNTER — Ambulatory Visit: Payer: Self-pay

## 2024-05-24 NOTE — Telephone Encounter (Signed)
 Pt called back she had not heard back from her provider. She is also wondering if it is ok to take sumatriptan  everyday.  Pt will consult with pharmacy, will make a MyChart VV, or possibly go to UC or Ed.

## 2024-05-24 NOTE — Telephone Encounter (Signed)
 FYI Only or Action Required?: Action required by provider: clinical question for provider.  Patient was last seen in primary care on 05/14/2024 by Antonio Meth, Jamee SAUNDERS, DO.  Called Nurse Triage reporting Neck Pain.  Symptoms began a week ago.  Interventions attempted: Prescription medications: Imitrex .  Symptoms are: gradually worsening. States neck pain is causing migraines, taking Imitrex , but is afraid of taking too much of it before I see the neurosurgeon. Asking for another medication, please advise pt.   Triage Disposition: See PCP When Office is Open (Within 3 Days)  Patient/caregiver understands and will follow disposition?: No, wishes to speak with PCP   Copied from CRM #[id number]. Topic: Clinical - Red Word Triage >> May 24, 2024  9:55 AM Arm[place] J wrote: [facility] that prompted transfer to Nurse Triage: Patient is having neck pain that is causing migraines and was wondering if there was anything else she could take other than her migraine medicine to help with the neck pain. Reason for Disposition  Neck pain present > 2 weeks  Answer Assessment - Initial Assessment Questions 1. ONSET: When did the pain begin?      For awhile 2. LOCATION: Where does it hurt?      neck 3. PATTERN Does the pain come and go, or has it been constant since it started?      Comes and goes 4. SEVERITY: How bad is the pain?  (Scale 0-10; or none or slight stiffness, mild, moderate, severe)     moderate 5. RADIATION: Does the pain go anywhere else, shoot into your arms?     Having migraines 6. CORD SYMPTOMS: Any weakness or numbness of the arms or legs?     no 7. CAUSE: What do you think is causing the neck pain?     disc 8. NECK OVERUSE: Any recent activities that involved turning or twisting the neck?     no 9. OTHER SYMPTOMS: Do you have any other symptoms? (e.g., headache, fever, chest pain, difficulty breathing, neck swelling)     migraines 10. PREGNANCY: Is there any  chance you are pregnant? When was your last menstrual period?       no  Protocols used: Neck Pain or Stiffness-A-AH

## 2024-05-27 ENCOUNTER — Telehealth: Payer: Self-pay | Admitting: Family Medicine

## 2024-05-27 NOTE — Telephone Encounter (Unsigned)
 Copied from CRM [id number]. Topic: Referral - Request for Referral >> May 27, 2024 10:30 AM Suzen RAMAN wrote: Did the patient discuss referral with their provider in the last year? Yes (If No - schedule appointment) (If Yes - send message)  Appointment offered? No  Type of order/referral and detailed reason for visit: Spine  Preference of office, provider, location:  [facility] [address] [place], [place], [place] [address] [phone]  If referral order, have you been seen by this specialty before? No (If Yes, this issue or another issue? When? Where?  Can we respond through MyChart? Yes

## 2024-05-27 NOTE — Telephone Encounter (Signed)
 Copied from CRM [id number]. Topic: Referral - Request for Referral >> May 27, 2024 10:05 AM Aleatha BROCKS wrote: Did the patient discuss referral with their provider in the last year? Yes (If No - schedule appointment) (If Yes - send message)  Appointment offered? No  Type of order/referral and detailed reason for visit: Spinal Surgeon  Preference of office, provider, location: [facility]  If referral order, have you been seen by this specialty before? No (If Yes, this issue or another issue? When? Where?  Can we respond through MyChart? No  Patient said she was referred to [facility] and they don't do  Spinal surgery and she can not go to [place] she needs to stay in [place] with [employer] insurance

## 2024-05-27 NOTE — Telephone Encounter (Signed)
 Patient was advised and stated she took a migraine pill and it helped with the pain. Also she stated thank you for fixing her referral and everything you do for her.

## 2024-05-29 ENCOUNTER — Telehealth: Payer: Self-pay | Admitting: Family Medicine

## 2024-05-29 NOTE — Telephone Encounter (Unsigned)
 Copied from CRM [id number]. Topic: Clinical - Medication Question >> May 29, 2024 11:18 AM Donna Guerra wrote: Reason for CRM: Patient was prescribed Qulipta  and Erenumab -aooe (AIMOVIG ) 70 MG/ML SOAJ By Dr Antonio. She's wondering which would be better at combating migraines so that way she doesn't have to refill both medications if not needed.

## 2024-05-30 NOTE — Telephone Encounter (Signed)
 Pt.notified

## 2024-06-19 ENCOUNTER — Telehealth: Payer: Self-pay | Admitting: *Deleted

## 2024-06-19 NOTE — Telephone Encounter (Signed)
   Pre-operative Risk Assessment    Patient Name: Donna Guerra  DOB: 06-12-1951 MRN: [id number]   Date of last office visit: 05/23/24 DR. SKAINS Date of next office visit: 06/25/24 DR. PARKER   Request for Surgical Clearance    Procedure:  C3-4. C4-5, C5-6 ANTERIOR CERVICAL FUSION  Date of Surgery:  Clearance TBD                                Surgeon:  DR. ARLEY HELLING Surgeon's Group or Practice Name:  [facility] Phone number:  [phone] Fax number:  [phone] [address]T [phone]   Type of Clearance Requested:   - Medical  - Pharmacy:  Hold Apixaban  (Eliquis )     Type of Anesthesia:  General    Additional requests/questions:    Bonney Niels Jest   06/19/2024, 3:49 PM

## 2024-06-21 NOTE — Telephone Encounter (Signed)
   Patient Name: Donna Guerra  DOB: 01-01-1951 MRN: [id number]  Primary Cardiologist: Dr. Jeffrie  Clinical pharmacists have reviewed the patient's past medical history, labs, and current medications as part of preoperative protocol coverage. The following recommendations have been made:  Procedure:  C3-4. C4-5, C5-6 ANTERIOR CERVICAL FUSION   Date of Surgery:  Clearance TBD       CHA2DS2-VASc Score = 3   This indicates a 3.2% annual risk of stroke. The patient's score is based upon: CHF History: 0 HTN History: 0 Diabetes History: 0 Stroke History: 0 Vascular Disease History: 0 Age Score: 2 Gender Score: 1   CrCl 62 Platelet count 271   Patient has not had an Afib/aflutter ablation or Watchman within the last 3 months or DCCV within the last 30 days      Per office protocol, patient can hold Eliquis  for 3 days prior to procedure.   Patient will not need bridging with Lovenox (enoxaparin) around procedure.  I will route this recommendation to the requesting party via Epic fax function and remove from pre-op pool.  Please call with questions.  Lamarr Satterfield, NP 06/21/2024, 8:20 AM

## 2024-06-21 NOTE — Telephone Encounter (Signed)
 Patient with diagnosis of atrial fibrillation on Eliquis  for anticoagulation.    Procedure:  C3-4. C4-5, C5-6 ANTERIOR CERVICAL FUSION   Date of Surgery:  Clearance TBD     CHA2DS2-VASc Score = 3   This indicates a 3.2% annual risk of stroke. The patient's score is based upon: CHF History: 0 HTN History: 0 Diabetes History: 0 Stroke History: 0 Vascular Disease History: 0 Age Score: 2 Gender Score: 1   CrCl 62 Platelet count 271  Patient has not had an Afib/aflutter ablation or Watchman within the last 3 months or DCCV within the last 30 days    Per office protocol, patient can hold Eliquis  for 3 days prior to procedure.   Patient will not need bridging with Lovenox (enoxaparin) around procedure.  **This guidance is not considered finalized until pre-operative APP has relayed final recommendations.**

## 2024-06-21 NOTE — Telephone Encounter (Signed)
 Please advise holding Eliquis  prior to cervical fusion not yet scheduled.   Thank you!  DW

## 2024-06-24 NOTE — Progress Notes (Unsigned)
 Electrophysiology Office Note:   Date:  06/25/2024  ID:  Donna Guerra, DOB [birth year], MRN [id number]  Primary Cardiologist: None Electrophysiologist: Fonda Kitty, MD      History of Present Illness:   Donna Guerra is a 73 y.o. female with h/o hyperlipidemia, atrial flutter and atrial fibrillation who is being seen today for evaluation of her atrial arrhythmias at the request of Dr. Jeffrie.  Discussed the use of AI scribe software for clinical note transcription with the patient, who gave verbal consent to proceed.  History of Present Illness Donna Guerra is a 73 year old female with atrial fibrillation and flutter who presents with palpitations and rapid heart rate. She is accompanied by her husband. She was referred for evaluation of atrial fibrillation and flutter.  She experiences episodes of rapid heart rate and palpitations, described as 'tremors' inside her body, occurring both while sitting and standing. These symptoms prompted the use of a Zio patch for monitoring. The Zio patch revealed she was out of rhythm 4% of the time, with the longest episode lasting approximately four and a half minutes. During these episodes, her heart rate reached about 140 beats per minute, which she could feel distinctly. These episodes are not always frequent, and she has not felt them recently.  She is on Eliquis  for atrial fibrillation and flutter. She has been prescribed diltiazem  as needed but has not taken it. Her heart rate occasionally spikes to 130 beats per minute, but she does not always feel these episodes.  She experiences cervicogenic headaches and migraines due to cervical disc issues, described as 'tremendous pain'. She is awaiting neck surgery. This is her biggest concern at this time.    Review of systems complete and found to be negative unless listed in HPI.   EP Information / Studies Reviewed:    EKG is ordered today. Personal review as below.  EKG  Interpretation Date/Time:  Tuesday June 25 2024 14:02:49 EDT Ventricular Rate:  79 PR Interval:  134 QRS Duration:  68 QT Interval:  376 QTC Calculation: 431 R Axis:   70  Text Interpretation: Sinus rhythm with Premature supraventricular complexes Nonspecific ST abnormality When compared with ECG of 28-Apr-2024 14:59, Single PACs have replaced runs of AT Confirmed by Kitty Fonda [phone]) on 06/25/2024 2:11:05 PM   Zio 04/2024:    Risk Assessment/Calculations:    CHA2DS2-VASc Score = 3   This indicates a 3.2% annual risk of stroke. The patient's score is based upon: CHF History: 0 HTN History: 0 Diabetes History: 0 Stroke History: 0 Vascular Disease History: 0 Age Score: 2 Gender Score: 1             Physical Exam:   VS:  BP 121/71   Pulse 64   Ht 5' 4.4 (1.636 m)   Wt 126 lb 9.6 oz (57.4 kg)   SpO2 98%   BMI 21.46 kg/m    Wt Readings from Last 3 Encounters:  06/25/24 126 lb 9.6 oz (57.4 kg)  05/23/24 124 lb (56.2 kg)  05/14/24 126 lb (57.2 kg)     GEN: Well nourished, well developed in no acute distress NECK: No JVD CARDIAC: Normal rate, irregular rhythm RESPIRATORY:  Clear to auscultation without rales, wheezing or rhonchi  ABDOMEN: Soft, non-distended EXTREMITIES:  No edema; No deformity   ASSESSMENT AND PLAN:    #Atrial flutter: #Paroxysmal atrial fibrillation: #PACs/AT -She is quite symptomatic. I suspect she has bursts of pulmonary vein atrial tachycardia that at times trigger  typical atrial flutter and/or paroxysms of atrial fibrillation. She is currently prescribed diltiazem  as needed. We discussed scheduled rate control medications such as diltiazem , starting anti-arrhythmic drug therapy for suppression and EP study and catheter ablation. Patient prefers to limit medications and would like to eventually pursue EP study and ablation. Her bigger concern at this time is her chronic neck pain and cervical spinal stenosis. We will defer EP study and  catheter ablation until after this has been addressed. We will continue diltiazem  as needed for now. If episodes worsen in severity or burden increases then we will start scheduled long acting diltiazem .  -Echo scheduled 06/27/24.  #Hypercoagulable state due to AF/AFL: No bleeding issues.  - Continue Eliquis  5mg  BID for now. Patient wants to come off anti-coagulation long-term. If she pursues ablation then likely monitor with loop or wearable device and if no recurrence then stop anti-coagulation.  #Preoperative evaluation:  -No contraindication from EP perspective for neck surgery. She may have increased burden of atrial arrhythmias during the peri-operative period but this is not prohibitive. She can interrupt anti-coagulation as needed for surgery, usually 2 days prior and resume as soon as safe from surgical perspective. Will need echocardiogram completed for general cardiology clearance, defer to Dr. Jeffrie.    Follow up with Dr. Kennyth in 3 months.   Signed, Fonda Kennyth, MD

## 2024-06-25 ENCOUNTER — Encounter: Payer: Self-pay | Admitting: Cardiology

## 2024-06-25 ENCOUNTER — Ambulatory Visit: Attending: Cardiology | Admitting: Cardiology

## 2024-06-25 ENCOUNTER — Other Ambulatory Visit (HOSPITAL_COMMUNITY): Payer: Self-pay

## 2024-06-25 VITALS — BP 121/71 | HR 64 | Ht 64.4 in | Wt 126.6 lb

## 2024-06-25 DIAGNOSIS — I480 Paroxysmal atrial fibrillation: Secondary | ICD-10-CM

## 2024-06-25 DIAGNOSIS — D6869 Other thrombophilia: Secondary | ICD-10-CM | POA: Diagnosis not present

## 2024-06-25 DIAGNOSIS — I4892 Unspecified atrial flutter: Secondary | ICD-10-CM

## 2024-06-25 NOTE — Patient Instructions (Signed)
 Medication Instructions:  Your physician recommends that you continue on your current medications as directed. Please refer to the Current Medication list given to you today.  *If you need a refill on your cardiac medications before your next appointment, please call your pharmacy*  Follow-Up: At [facility], you and your health needs are our priority.  As part of our continuing mission to provide you with exceptional heart care, our providers are all part of one team.  This team includes your primary Cardiologist (physician) and Advanced Practice Providers or APPs (Physician Assistants and Nurse Practitioners) who all work together to provide you with the care you need, when you need it.  Your next appointment:   3 months   Provider:   Ardeen Kohler, MD

## 2024-06-27 ENCOUNTER — Ambulatory Visit (HOSPITAL_COMMUNITY)
Admission: RE | Admit: 2024-06-27 | Discharge: 2024-06-27 | Disposition: A | Discharge status: Home / Self Care | Source: Ambulatory Visit | Attending: Cardiology | Admitting: Cardiology

## 2024-06-27 ENCOUNTER — Other Ambulatory Visit (HOSPITAL_COMMUNITY): Payer: Self-pay

## 2024-06-27 DIAGNOSIS — I480 Paroxysmal atrial fibrillation: Secondary | ICD-10-CM | POA: Diagnosis present

## 2024-06-27 LAB — ECHOCARDIOGRAM COMPLETE
AR max vel: 1.76 cm2
AV Area VTI: 1.74 cm2
AV Area mean vel: 1.68 cm2
AV Mean grad: 4 mmHg
AV Peak grad: 7.6 mmHg
Ao pk vel: 1.38 m/s
Area-P 1/2: 3.48 cm2
S' Lateral: 2.7 cm

## 2024-06-28 ENCOUNTER — Other Ambulatory Visit: Payer: Self-pay | Admitting: Neurosurgery

## 2024-06-28 NOTE — Telephone Encounter (Signed)
 Pt calling back to f/u on Clearance. Please advise

## 2024-06-28 NOTE — Telephone Encounter (Addendum)
 Pt calling to check on stats of clearance. Pt called stating the following: Patient following up regarding clearance. She says [facility] contacted her regarding her being cleared for procedure. However, she assumes echo should have been reviewed prior to her being released for surgery  Echo completed 06/27/24.  Will route to the Preop Pool for the Preop APP for review.

## 2024-06-28 NOTE — Telephone Encounter (Signed)
 I have reviewed the echo report. Essentially normal with Grade I diastolic dysfunction and mild mitral valve regurgitation. However, Dr. Jeffrie as not commented on this yet in the chart. From pharmacy standpoint she is okay.  Can wait for Dr.Skains if she is more comfortable.

## 2024-06-28 NOTE — Telephone Encounter (Signed)
 Patient following up regarding clearance. She says [facility] contacted her regarding her being cleared for procedure. However, she assumes echo should have been reviewed prior to her being released for surgery. Please advise.

## 2024-06-28 NOTE — Telephone Encounter (Signed)
   Patient Name: Donna Guerra  DOB: 08/25/1951 MRN: [id number]  Primary Cardiologist: None  Chart reviewed as part of pre-operative protocol coverage. Pre-op clearance already addressed by colleagues in earlier phone notes. To summarize recommendations:  -Please see my note for further details. At the very end I do state that she may proceed with cervical spine procedure and may hold Eliquis  for procedure.  -Dr. Jeffrie  Per office protocol, patient can hold Eliquis  for 3 days prior to procedure.  Please resume when medically safe to do so. Patient will not need bridging with Lovenox (enoxaparin) around procedure.  Echocardiogram reviewed today 06/28/2024.  I do not see any reason on her echo that would keep her from getting surgery.  She may proceed with C3-4, C4-5, C5-6 anterior cervical fusion as planned without any further cardiac testing.  Will route this bundled recommendation to requesting provider via Epic fax function and remove from pre-op pool. Please call with questions.  [name], PA-C 06/28/2024, 4:29 PM

## 2024-07-01 ENCOUNTER — Ambulatory Visit: Payer: Self-pay | Admitting: Cardiology

## 2024-07-02 NOTE — Telephone Encounter (Addendum)
   Patient Name: Donna Guerra  DOB: [birth year] MRN: [id number]  Primary Cardiologist: None  Chart reviewed as part of pre-operative protocol coverage. Patient was recently seen by Dr. Jeffrie and Dr. Kennyth and was cleared for surgery.  Recent echocardiogram was reassuring. Therefore, given past medical history and time since last visit, based on ACC/AHA guidelines, Donna Guerra is at acceptable risk for the planned procedure without further cardiovascular testing.   Per office protocol, patient can hold Eliquis  for 3 days prior to procedure.  Patient will not need bridging with Lovenox (enoxaparin) around procedure. Please resume Eliquis  as soon as possible postprocedure, at the discretion of the surgeon.   I will route this recommendation to the requesting party via Epic fax function and remove from pre-op pool.  Please call with questions.  Donna JAYSON Braver, NP 07/02/2024, 9:58 AM

## 2024-07-04 ENCOUNTER — Encounter (HOSPITAL_COMMUNITY): Payer: Self-pay

## 2024-07-04 NOTE — Progress Notes (Addendum)
 Surgical Instructions   Your procedure is scheduled on Wednesday, September 24th, 2025. Report to [facility] Main Entrance A at 9:00 A.M., then check in with the Admitting office. Any questions or running late day of surgery: call [phone]  Questions prior to your surgery date: call [phone], Monday-Friday, 8am-4pm. If you experience any cold or flu symptoms such as cough, fever, chills, shortness of breath, etc. between now and your scheduled surgery, please notify us  at the above number.     Remember:  Do not eat or drink after midnight the night before your surgery    Take these medicines the morning of surgery with A SIP OF WATER: Atorvastatin  (Lipitor) Diltiazem  (Cardizem )   May take these medicines IF NEEDED: Clear Eyes Eye drops Sumatriptan  (Imitrex )   Apixaban  (Eliquis ) should be held for 3 days prior to your procedure.  Your last dose should be on Saturday, September 20th.     One week prior to surgery, STOP taking any Aspirin (unless otherwise instructed by your surgeon) Aleve, Naproxen, Ibuprofen , Motrin , Advil , Goody's, BC's, all herbal medications, fish oil, and non-prescription vitamins.                     Do NOT Smoke (Tobacco/Vaping) for 24 hours prior to your procedure.  If you use a CPAP at night, you may bring your mask/headgear for your overnight stay.   You will be asked to remove any contacts, glasses, piercing's, hearing aid's, dentures/partials prior to surgery. Please bring cases for these items if needed.    Patients discharged the day of surgery will not be allowed to drive home, and someone needs to stay with them for 24 hours.  SURGICAL WAITING ROOM VISITATION Patients may have no more than 2 support people in the waiting area - these visitors may rotate.   Pre-op nurse will coordinate an appropriate time for 1 ADULT support person, who may not rotate, to accompany patient in pre-op.  Children under the age of 78 must have an adult  with them who is not the patient and must remain in the main waiting area with an adult.  If the patient needs to stay at the hospital during part of their recovery, the visitor guidelines for inpatient rooms apply.  Please refer to the [facility] website for the visitor guidelines for any additional information.   If you received a COVID test during your pre-op visit  it is requested that you wear a mask when out in public, stay away from anyone that may not be feeling well and notify your surgeon if you develop symptoms. If you have been in contact with anyone that has tested positive in the last 10 days please notify you surgeon.      Pre-operative 5 CHG Bathing Instructions   You can play a key role in reducing the risk of infection after surgery. Your skin needs to be as free of germs as possible. You can reduce the number of germs on your skin by washing with CHG (chlorhexidine gluconate) soap before surgery. CHG is an antiseptic soap that kills germs and continues to kill germs even after washing.   DO NOT use if you have an allergy to chlorhexidine/CHG or antibacterial soaps. If your skin becomes reddened or irritated, stop using the CHG and notify one of our RNs at [phone].   Please shower with the CHG soap starting 4 days before surgery using the following schedule:     Please keep in mind the  following:  DO NOT shave, including legs and underarms, starting the day of your first shower.   You may shave your face at any point before/day of surgery.  Place clean sheets on your bed the day you start using CHG soap. Use a clean washcloth (not used since being washed) for each shower. DO NOT sleep with pets once you start using the CHG.   CHG Shower Instructions:  Wash your face and private area with normal soap. If you choose to wash your hair, wash first with your normal shampoo.  After you use shampoo/soap, rinse your hair and body thoroughly to remove shampoo/soap  residue.  Turn the water OFF and apply about 3 tablespoons (45 ml) of CHG soap to a CLEAN washcloth.  Apply CHG soap ONLY FROM YOUR NECK DOWN TO YOUR TOES (washing for 3-5 minutes)  DO NOT use CHG soap on face, private areas, open wounds, or sores.  Pay special attention to the area where your surgery is being performed.  If you are having back surgery, having someone wash your back for you may be helpful. Wait 2 minutes after CHG soap is applied, then you may rinse off the CHG soap.  Pat dry with a clean towel  Put on clean clothes/pajamas   If you choose to wear lotion, please use ONLY the CHG-compatible lotions that are listed below.  Additional instructions for the day of surgery: DO NOT APPLY any lotions, deodorants, cologne, or perfumes.   Do not bring valuables to the hospital. [facility] is not responsible for any belongings/valuables. Do not wear nail polish, gel polish, artificial nails, or any other type of covering on natural nails (fingers and toes) Do not wear jewelry or makeup Put on clean/comfortable clothes.  Please brush your teeth.  Ask your nurse before applying any prescription medications to the skin.     CHG Compatible Lotions   Aveeno Moisturizing lotion  Cetaphil Moisturizing Cream  Cetaphil Moisturizing Lotion  Clairol Herbal Essence Moisturizing Lotion, Dry Skin  Clairol Herbal Essence Moisturizing Lotion, Extra Dry Skin  Clairol Herbal Essence Moisturizing Lotion, Normal Skin  Curel Age Defying Therapeutic Moisturizing Lotion with Alpha Hydroxy  Curel Extreme Care Body Lotion  Curel Soothing Hands Moisturizing Hand Lotion  Curel Therapeutic Moisturizing Cream, Fragrance-Free  Curel Therapeutic Moisturizing Lotion, Fragrance-Free  Curel Therapeutic Moisturizing Lotion, Original Formula  Eucerin Daily Replenishing Lotion  Eucerin Dry Skin Therapy Plus Alpha Hydroxy Crme  Eucerin Dry Skin Therapy Plus Alpha Hydroxy Lotion  Eucerin Original Crme   Eucerin Original Lotion  Eucerin Plus Crme Eucerin Plus Lotion  Eucerin TriLipid Replenishing Lotion  Keri Anti-Bacterial Hand Lotion  Keri Deep Conditioning Original Lotion Dry Skin Formula Softly Scented  Keri Deep Conditioning Original Lotion, Fragrance Free Sensitive Skin Formula  Keri Lotion Fast Absorbing Fragrance Free Sensitive Skin Formula  Keri Lotion Fast Absorbing Softly Scented Dry Skin Formula  Keri Original Lotion  Keri Skin Renewal Lotion Keri Silky Smooth Lotion  Keri Silky Smooth Sensitive Skin Lotion  Nivea Body Creamy Conditioning Oil  Nivea Body Extra Enriched Lotion  Nivea Body Original Lotion  Nivea Body Sheer Moisturizing Lotion Nivea Crme  Nivea Skin Firming Lotion  NutraDerm 30 Skin Lotion  NutraDerm Skin Lotion  NutraDerm Therapeutic Skin Cream  NutraDerm Therapeutic Skin Lotion  ProShield Protective Hand Cream  Provon moisturizing lotion  Please read over the following fact sheets that you were given.

## 2024-07-05 ENCOUNTER — Encounter (HOSPITAL_COMMUNITY): Payer: Self-pay

## 2024-07-05 ENCOUNTER — Other Ambulatory Visit: Payer: Self-pay

## 2024-07-05 ENCOUNTER — Encounter (HOSPITAL_COMMUNITY)
Admission: RE | Admit: 2024-07-05 | Discharge: 2024-07-05 | Disposition: A | Discharge status: Home / Self Care | Source: Ambulatory Visit | Attending: Neurosurgery | Admitting: Neurosurgery

## 2024-07-05 VITALS — BP 125/69 | HR 69 | Temp 97.7°F | Resp 16 | Ht 63.5 in | Wt 125.4 lb

## 2024-07-05 DIAGNOSIS — Z01812 Encounter for preprocedural laboratory examination: Secondary | ICD-10-CM | POA: Diagnosis present

## 2024-07-05 DIAGNOSIS — I4892 Unspecified atrial flutter: Secondary | ICD-10-CM | POA: Diagnosis not present

## 2024-07-05 DIAGNOSIS — Z01818 Encounter for other preprocedural examination: Secondary | ICD-10-CM

## 2024-07-05 DIAGNOSIS — Z7901 Long term (current) use of anticoagulants: Secondary | ICD-10-CM | POA: Diagnosis not present

## 2024-07-05 DIAGNOSIS — I2510 Atherosclerotic heart disease of native coronary artery without angina pectoris: Secondary | ICD-10-CM | POA: Insufficient documentation

## 2024-07-05 DIAGNOSIS — E785 Hyperlipidemia, unspecified: Secondary | ICD-10-CM | POA: Diagnosis not present

## 2024-07-05 DIAGNOSIS — I4891 Unspecified atrial fibrillation: Secondary | ICD-10-CM | POA: Diagnosis not present

## 2024-07-05 HISTORY — DX: Other specified postprocedural states: Z98.890

## 2024-07-05 HISTORY — DX: Unspecified atrial fibrillation: I48.91

## 2024-07-05 HISTORY — DX: Unspecified atrial flutter: I48.92

## 2024-07-05 HISTORY — DX: Other specified postprocedural states: R11.2

## 2024-07-05 LAB — BASIC METABOLIC PANEL WITH GFR
Anion gap: 12 (ref 5–15)
BUN: 12 mg/dL (ref 8–23)
CO2: 24 mmol/L (ref 22–32)
Calcium: 9.8 mg/dL (ref 8.9–10.3)
Chloride: 103 mmol/L (ref 98–111)
Creatinine, Ser: 0.72 mg/dL (ref 0.44–1.00)
GFR, Estimated: >60 mL/min (ref >60)
Glucose, Bld: 98 mg/dL (ref 70–99)
Potassium: 4.3 mmol/L (ref 3.5–5.1)
Sodium: 139 mmol/L (ref 135–145)

## 2024-07-05 LAB — CBC
HCT: 42.3 % (ref 36.0–46.0)
Hemoglobin: 14.2 g/dL (ref 12.0–15.0)
MCH: 33.1 pg (ref 26.0–34.0)
MCHC: 33.6 g/dL (ref 30.0–36.0)
MCV: 98.6 fL (ref 80.0–100.0)
Platelets: 282 K/uL (ref 150–400)
RBC: 4.29 MIL/uL (ref 3.87–5.11)
RDW: 11.9 % (ref 11.5–15.5)
WBC: 9.7 K/uL (ref 4.0–10.5)
nRBC: 0 % (ref 0.0–0.2)

## 2024-07-05 LAB — SURGICAL PCR SCREEN
MRSA, PCR: NEGATIVE
Staphylococcus aureus: NEGATIVE

## 2024-07-05 LAB — TYPE AND SCREEN
ABO/RH(D): O POS
Antibody Screen: NEGATIVE

## 2024-07-05 NOTE — Progress Notes (Signed)
 PCP - Dr. Domenica Cardiologist - Dr. Jeffrie EP: Dr. Kennyth -- for A flutter   Chest x-ray -  EKG - 06/25/24 Stress Test - denies ECHO - 06/27/24 Cardiac Cath - denies  Sleep Study - denies CPAP -   Fasting Blood Sugar - n/a Checks Blood Sugar _____ times a day  Last dose of GLP1 agonist-  n/a GLP1 instructions:   Blood Thinner Instructions: Stop Eliquis  3 days Aspirin Instructions:  ERAS Protcol - NPO PRE-SURGERY Ensure or G2-   COVID TEST- n/a   Anesthesia review: Yes, Heart hx  Patient denies shortness of breath, fever, cough and chest pain at PAT appointment

## 2024-07-07 NOTE — Assessment & Plan Note (Signed)
 Patient encouraged to maintain heart healthy diet, regular exercise, adequate sleep. Consider daily probiotics. Take medications as prescribed. Labs ordered and reviewed Given Prevnar 20 COLONOSCOPY: patient reports last colonoscopy was 2022 but this is not in chart, last one sent to us  by GI 05/22/2015, Dr Burnette Ee, will have staff request MAMMO: 2025 repeat every1-2 years PAP: 03/30/2021 repeat every 3-5 years DEXA: 04/27/2021 repeat every 2-5 years

## 2024-07-07 NOTE — Assessment & Plan Note (Signed)
 Encouraged to get adequate exercise, calcium and vitamin d intake

## 2024-07-07 NOTE — Assessment & Plan Note (Signed)
 hgba1c acceptable, minimize simple carbs. Increase exercise as tolerated.

## 2024-07-07 NOTE — Assessment & Plan Note (Signed)
 monitor

## 2024-07-07 NOTE — Assessment & Plan Note (Signed)
 Tolerating statin, encouraged heart healthy diet, avoid trans fats, minimize simple carbs and saturated fats. Increase exercise as tolerated

## 2024-07-08 ENCOUNTER — Encounter: Payer: Self-pay | Admitting: Family Medicine

## 2024-07-08 ENCOUNTER — Ambulatory Visit: Payer: Medicare HMO | Admitting: Family Medicine

## 2024-07-08 VITALS — BP 124/76 | HR 60 | Temp 97.5°F | Resp 16 | Ht 63.5 in | Wt 124.4 lb

## 2024-07-08 DIAGNOSIS — E538 Deficiency of other specified B group vitamins: Secondary | ICD-10-CM | POA: Diagnosis not present

## 2024-07-08 DIAGNOSIS — Z0000 Encounter for general adult medical examination without abnormal findings: Secondary | ICD-10-CM

## 2024-07-08 DIAGNOSIS — R739 Hyperglycemia, unspecified: Secondary | ICD-10-CM

## 2024-07-08 DIAGNOSIS — E785 Hyperlipidemia, unspecified: Secondary | ICD-10-CM

## 2024-07-08 DIAGNOSIS — M8589 Other specified disorders of bone density and structure, multiple sites: Secondary | ICD-10-CM | POA: Diagnosis not present

## 2024-07-08 LAB — CBC WITH DIFFERENTIAL/PLATELET
Basophils Absolute: 0.1 K/uL (ref 0.0–0.1)
Basophils Relative: 1 % (ref 0.0–3.0)
Eosinophils Absolute: 0.1 K/uL (ref 0.0–0.7)
Eosinophils Relative: 1.4 % (ref 0.0–5.0)
HCT: 40.8 % (ref 36.0–46.0)
Hemoglobin: 13.9 g/dL (ref 12.0–15.0)
Lymphocytes Relative: 28.1 % (ref 12.0–46.0)
Lymphs Abs: 1.9 K/uL (ref 0.7–4.0)
MCHC: 34.1 g/dL (ref 30.0–36.0)
MCV: 96.6 fl (ref 78.0–100.0)
Monocytes Absolute: 0.5 K/uL (ref 0.1–1.0)
Monocytes Relative: 7.1 % (ref 3.0–12.0)
Neutro Abs: 4.2 K/uL (ref 1.4–7.7)
Neutrophils Relative %: 62.4 % (ref 43.0–77.0)
Platelets: 264 K/uL (ref 150.0–400.0)
RBC: 4.23 Mil/uL (ref 3.87–5.11)
RDW: 13 % (ref 11.5–15.5)
WBC: 6.8 K/uL (ref 4.0–10.5)

## 2024-07-08 LAB — LIPID PANEL
Cholesterol: 155 mg/dL (ref <200)
HDL: 57 mg/dL (ref >=50)
LDL Cholesterol (Calc): 81 mg/dL
Non-HDL Cholesterol (Calc): 98 mg/dL (ref <130)
Total CHOL/HDL Ratio: 2.7 (calc) (ref <5.0)
Triglycerides: 91 mg/dL (ref <150)

## 2024-07-08 LAB — COMPREHENSIVE METABOLIC PANEL WITH GFR
AG Ratio: 1.7 (calc) (ref 1.0–2.5)
ALT: 20 U/L (ref 6–29)
AST: 20 U/L (ref 10–35)
Albumin: 4.5 g/dL (ref 3.6–5.1)
Alkaline phosphatase (APISO): 84 U/L (ref 37–153)
BUN: 18 mg/dL (ref 7–25)
CO2: 28 mmol/L (ref 20–32)
Calcium: 10.2 mg/dL (ref 8.6–10.4)
Chloride: 102 mmol/L (ref 98–110)
Creat: 0.77 mg/dL (ref 0.60–1.00)
Globulin: 2.6 g/dL (ref 1.9–3.7)
Glucose, Bld: 111 mg/dL — ABNORMAL HIGH (ref 65–99)
Potassium: 4.4 mmol/L (ref 3.5–5.3)
Sodium: 139 mmol/L (ref 135–146)
Total Bilirubin: 0.9 mg/dL (ref 0.2–1.2)
Total Protein: 7.1 g/dL (ref 6.1–8.1)
eGFR: 80 mL/min/1.73m2 (ref >=60)

## 2024-07-08 LAB — VITAMIN D 25 HYDROXY (VIT D DEFICIENCY, FRACTURES): Vit D, 25-Hydroxy: 37 ng/mL (ref 30–100)

## 2024-07-08 LAB — VITAMIN B12: Vitamin B-12: 338 pg/mL (ref 200–1100)

## 2024-07-08 LAB — TSH: TSH: 1.47 m[IU]/L (ref 0.40–4.50)

## 2024-07-08 LAB — HEMOGLOBIN A1C: Hgb A1c MFr Bld: 6.1 % (ref 4.6–6.5)

## 2024-07-08 NOTE — Addendum Note (Signed)
 Addended by: ESTELLE GILLIS D on: 07/08/2024 03:03 PM   Modules accepted: Orders

## 2024-07-08 NOTE — Progress Notes (Signed)
 Subjective:    Patient ID: Donna Guerra, female    DOB: [birth year], 73 y.o.   MRN: [id number]  Chief Complaint  Patient presents with   Annual Exam    Patient presents today for a physical exam.   Quality Metric Gaps    AWV    HPI Discussed the use of AI scribe software for clinical note transcription with the patient, who gave verbal consent to proceed.  History of Present Illness Donna Guerra is a 73 year old female with cervicogenic headaches who presents with persistent headaches and neck pain.  She has been experiencing persistent headaches and neck pain since July, initially thought to be migraines. The headaches are severe, daily, and originate in her neck, triggering migraines. She has a history of using sumatriptan  for migraines, but it has not been effective for these headaches. In July, she visited the emergency room and received steroids and IV treatments without relief. Sumatriptan  provides some relief, but she is limited to nine pills per month.  She has been prescribed Aimovig  for migraine prevention, which she has been using for two months. The first dose provided relief after an initial increase in headache severity, but the second dose was less effective. She tried gabapentin from her husband's prescription and found it effective in alleviating her headaches.  She is concerned about the recovery process, particularly the restrictions on bending and lifting, as she cares for a senior dog. Her husband is preparing to assist her during recovery. She is anxious about the surgery and its impact on her daily life.  She has a history of atrial flutter and is under the care of a cardiologist, Dr. Kennyth, who has cleared her for the upcoming surgery. She is also being monitored for potential ablation therapy post-surgery.  Her current medications include sumatriptan , Aimovig , and fish oil. No recent major illness, no chest pain, no new bowel troubles. Reports a flutter in  her heart.    Past Medical History:  Diagnosis Date   Atrial flutter (HCC)    Back pain 04/11/2017   CIN I (cervical intraepithelial neoplasia I) 2005   Colon polyps 07/08/2012   Hyperglycemia 06/13/2017   Hyperlipidemia 08/29/2012   Melanoma in situ of right shoulder (HCC) 03/17/2020   Migraines    Osteopenia 03/2018   T score -1.6 FRAX 11% / 2.9% stable from prior DEXA   PONV (postoperative nausea and vomiting)    Right shoulder pain 07/08/2012   Skin cancer 07/08/2012   Basal cell    Past Surgical History:  Procedure Laterality Date   ABDOMINAL SURGERY     Tummy tuck   CESAREAN SECTION  1978 and 1981   CHOLECYSTECTOMY     COLPOSCOPY     COSMETIC SURGERY     GYNECOLOGIC CRYOSURGERY     OOPHORECTOMY  2002   BSO   SHOULDER SURGERY     right rotator cuff repair   TONSILLECTOMY AND ADENOIDECTOMY     age 60   TUBAL LIGATION     VAGINAL HYSTERECTOMY  2002   LAVH BSO    Family History  Problem Relation Age of Onset   Hypertension Mother    Breast cancer Mother 30   Colon cancer Mother 48   Migraines Father    Migraines Sister    Cancer Sister        Melanoma   Breast cancer Sister 8   Cirrhosis Sister    Breast cancer Maternal Aunt 85   Breast cancer  Maternal Aunt        50's   Breast cancer Paternal Aunt        dx over 44   Diabetes Maternal Grandmother    Prostate cancer Cousin        maternal first cousin   Colon cancer Cousin        maternal first cousin   Cancer Cousin        unknown cancer   Breast cancer Cousin 57       TN breast cancer - maternal first cousin   Breast cancer Niece 60       DCIS    Social History   Socioeconomic History   Marital status: Married    Spouse name: Not on file   Number of children: Not on file   Years of education: Not on file   Highest education level: Not on file  Occupational History   Not on file  Tobacco Use   Smoking status: Never   Smokeless tobacco: Never  Vaping Use   Vaping status: Never  Used  Substance and Sexual Activity   Alcohol use: Not Currently    Comment: 2 drinks/month   Drug use: No   Sexual activity: Yes    Partners: Male    Birth control/protection: Surgical    Comment: Hyst, Less than 5, after 16, no STD, no DES  Other Topics Concern   Not on file  Social History Narrative   Not on file   Social Drivers of Health   Financial Resource Strain: Not on file  Food Insecurity: Not on file  Transportation Needs: Not on file  Physical Activity: Not on file  Stress: Not on file  Social Connections: Not on file  Intimate Partner Violence: Not on file    Outpatient Medications Prior to Visit  Medication Sig Dispense Refill   apixaban  (ELIQUIS ) 5 MG TABS tablet Take 1 tablet (5 mg total) by mouth 2 (two) times daily. 180 tablet 3   atorvastatin  (LIPITOR) 40 MG tablet Take 1 tablet (40 mg total) by mouth daily. 90 tablet 1   cholecalciferol  (VITAMIN D3) 25 MCG (1000 UNIT) tablet Take 1,000 Units by mouth daily.     diltiazem  (CARDIZEM ) 30 MG tablet Take 1 tablet (30 mg total) by mouth every 4 (four) hours as needed. For rapid heart rate 30 tablet 4   Erenumab -aooe (AIMOVIG ) 70 MG/ML SOAJ 70 mg sq qmo 1.12 mL 5   fish oil-omega-3 fatty acids  1000 MG capsule Take 1 g by mouth daily.     naphazoline-glycerin  (CLEAR EYES REDNESS) 0.012-0.25 % SOLN Place 1-2 drops into both eyes daily as needed for eye irritation.     NONFORMULARY OR COMPOUNDED ITEM Estradiol  vaginal cream 0.02% 1 ml prefilled syringe, apply 1 ml per vagina twice weekly.  Disp:  28, RF 12.  [facility]. 28 each 12   RETIN-A  0.1 % cream Apply 1 application  topically 2 (two) times a week.     SUMAtriptan  (IMITREX ) 100 MG tablet TAKE 1 TABLET BY MOUTH. MAY REPEAT IN 2 HOURS IF HEADACHE PERSISTS OR RECURS APPOINTMENT NEEDED FOR FURTHER REFILLS 9 tablet 11   No facility-administered medications prior to visit.    No Known Allergies  Review of Systems  Constitutional:  Negative for chills,  fever and malaise/fatigue.  HENT:  Negative for congestion and hearing loss.   Eyes:  Negative for discharge.  Respiratory:  Negative for cough, sputum production and shortness of breath.   Cardiovascular:  Positive for palpitations. Negative for chest pain and leg swelling.  Gastrointestinal:  Negative for abdominal pain, blood in stool, constipation, diarrhea, heartburn, nausea and vomiting.  Genitourinary:  Negative for dysuria, frequency, hematuria and urgency.  Musculoskeletal:  Positive for back pain and neck pain. Negative for falls and myalgias.  Skin:  Negative for rash.  Neurological:  Positive for headaches. Negative for dizziness, sensory change, loss of consciousness and weakness.  Endo/Heme/Allergies:  Negative for environmental allergies. Does not bruise/bleed easily.  Psychiatric/Behavioral:  Negative for depression and suicidal ideas. The patient is nervous/anxious. The patient does not have insomnia.        Objective:    Physical Exam Constitutional:      General: She is not in acute distress.    Appearance: Normal appearance. She is not diaphoretic.  HENT:     Head: Normocephalic and atraumatic.     Right Ear: Tympanic membrane, ear canal and external ear normal.     Left Ear: Tympanic membrane, ear canal and external ear normal.     Nose: Nose normal.     Mouth/Throat:     Mouth: Mucous membranes are moist.     Pharynx: Oropharynx is clear. No oropharyngeal exudate.  Eyes:     General: No scleral icterus.       Right eye: No discharge.        Left eye: No discharge.     Conjunctiva/sclera: Conjunctivae normal.     Pupils: Pupils are equal, round, and reactive to light.  Neck:     Thyroid : No thyromegaly.  Cardiovascular:     Rate and Rhythm: Normal rate and regular rhythm.     Heart sounds: Normal heart sounds. No murmur heard. Pulmonary:     Effort: Pulmonary effort is normal. No respiratory distress.     Breath sounds: Normal breath sounds. No wheezing  or rales.  Abdominal:     General: Bowel sounds are normal. There is no distension.     Palpations: Abdomen is soft. There is no mass.     Tenderness: There is no abdominal tenderness.  Musculoskeletal:        General: No tenderness. Normal range of motion.     Cervical back: Normal range of motion and neck supple.  Lymphadenopathy:     Cervical: No cervical adenopathy.  Skin:    General: Skin is warm and dry.     Findings: No rash.  Neurological:     General: No focal deficit present.     Mental Status: She is alert and oriented to person, place, and time.     Cranial Nerves: No cranial nerve deficit.     Coordination: Coordination normal.     Deep Tendon Reflexes: Reflexes are normal and symmetric. Reflexes normal.  Psychiatric:        Mood and Affect: Mood normal.        Behavior: Behavior normal.        Thought Content: Thought content normal.        Judgment: Judgment normal.     BP 124/76   Pulse 60   Temp (!) 97.5 F (36.4 C)   Resp 16   Ht 5' 3.5 (1.613 m)   Wt 124 lb 6.4 oz (56.4 kg)   SpO2 95%   BMI 21.69 kg/m  Wt Readings from Last 3 Encounters:  07/08/24 124 lb 6.4 oz (56.4 kg)  07/05/24 125 lb 6.4 oz (56.9 kg)  06/25/24 126 lb 9.6 oz (57.4 kg)  Diabetic Foot Exam - Simple   No data filed    Lab Results  Component Value Date   WBC 9.7 07/05/2024   HGB 14.2 07/05/2024   HCT 42.3 07/05/2024   PLT 282 07/05/2024   GLUCOSE 98 07/05/2024   CHOL 158 07/06/2023   TRIG 107.0 07/06/2023   HDL 60.00 07/06/2023   LDLCALC 77 07/06/2023   ALT 28 05/14/2024   AST 23 05/14/2024   NA 139 07/05/2024   K 4.3 07/05/2024   CL 103 07/05/2024   CREATININE 0.72 07/05/2024   BUN 12 07/05/2024   CO2 24 07/05/2024   TSH 1.83 04/23/2024   HGBA1C 5.9 07/06/2023    Lab Results  Component Value Date   TSH 1.83 04/23/2024   Lab Results  Component Value Date   WBC 9.7 07/05/2024   HGB 14.2 07/05/2024   HCT 42.3 07/05/2024   MCV 98.6 07/05/2024   PLT 282  07/05/2024   Lab Results  Component Value Date   NA 139 07/05/2024   K 4.3 07/05/2024   CO2 24 07/05/2024   GLUCOSE 98 07/05/2024   BUN 12 07/05/2024   CREATININE 0.72 07/05/2024   BILITOT 0.7 05/14/2024   ALKPHOS 69 05/14/2024   AST 23 05/14/2024   ALT 28 05/14/2024   PROT 7.2 05/14/2024   ALBUMIN 4.8 05/14/2024   CALCIUM  9.8 07/05/2024   ANIONGAP 12 07/05/2024   GFR 84.80 05/14/2024   Lab Results  Component Value Date   CHOL 158 07/06/2023   Lab Results  Component Value Date   HDL 60.00 07/06/2023   Lab Results  Component Value Date   LDLCALC 77 07/06/2023   Lab Results  Component Value Date   TRIG 107.0 07/06/2023   Lab Results  Component Value Date   CHOLHDL 3 07/06/2023   Lab Results  Component Value Date   HGBA1C 5.9 07/06/2023       Assessment & Plan:  Hyperglycemia Assessment & Plan: hgba1c acceptable, minimize simple carbs. Increase exercise as tolerated.    Hyperlipidemia, unspecified hyperlipidemia type Assessment & Plan: Tolerating statin, encouraged heart healthy diet, avoid trans fats, minimize simple carbs and saturated fats. Increase exercise as tolerated   Osteopenia of multiple sites Assessment & Plan: Encouraged to get adequate exercise, calcium  and vitamin d  intake   Preventative health care Assessment & Plan: Patient encouraged to maintain heart healthy diet, regular exercise, adequate sleep. Consider daily probiotics. Take medications as prescribed. Labs ordered and reviewed Given Prevnar 20 COLONOSCOPY: patient reports last colonoscopy was 2022 but this is not in chart, last one sent to us  by GI 05/22/2015, Dr Burnette Ee, will have staff request MAMMO: 2025 repeat every1-2 years PAP: 03/30/2021 repeat every 3-5 years DEXA: 04/27/2021 repeat every 2-5 years   Disorder of vitamin B12 Assessment & Plan: monitor     Assessment and Plan Assessment & Plan Cervicogenic headache and migraine (with and without  aura) Chronic cervicogenic headaches and migraines exacerbated by cervical spine disease. Previous treatment with sumatriptan  was insufficient. Aimovig  prescribed but insurance issues encountered. Gabapentin trial provided relief, but concerns about habituation and cognitive effects discussed. - Continue Aimovig  monthly as covered by [employer] - Discuss gabapentin use with neurosurgeon post-surgery - Consider gabapentin trial post-surgery if headaches persist  Cervical spine disease (pending anterior cervical fusion) Cervical spine disease requiring anterior cervical fusion. Surgery scheduled. Concerns about post-operative recovery and adherence to post-surgical restrictions discussed. Importance of protein intake and hydration for recovery emphasized. - Proceed with anterior cervical fusion -  Encourage high protein diet and hydration post-surgery - Avoid bending and lifting post-surgery - Encourage walking as primary post-operative activity  Atrial flutter Atrial flutter managed by cardiology. Cardiac clearance obtained for cervical spine surgery. Follow-up with cardiology planned for potential ablation. - Follow-up with cardiology in December for potential ablation  Hyperlipidemia Hyperlipidemia management not specifically discussed in this visit.  Vitamin D  deficiency and deficiency of B group vitamins Vitamin D  and B12 deficiency. Vitamin D  supplementation paused pre-surgery. B12 levels to be monitored. - Monitor B12 and vitamin D  levels with blood work - Resume vitamin D  supplementation post-surgery  Other specified disorders of bone density and structure, multiple sites Bone density issues monitored with previous bone density scan in 2022. Next scan planned in 2027. - Plan bone density scan in 2027  General Health Maintenance Reviewed vaccination status. Discussed new RSV vaccine and its potential benefits. Encouraged to consider post-surgery. - Consider RSV vaccination  post-surgery  Recording duration: 44 minutes     Harlene Horton, MD

## 2024-07-08 NOTE — Patient Instructions (Addendum)
 Yellow split pea powder. Whey powder, Collagen, egg white powder, chia seeds, flaxseed powder Megared Krill oil caps RSV, Respiratory Syncitial Virus vaccine, Arexvy at pharmacy COVID and flu shots annually  Bring in a  copy of  your Advanced Directives so we can put in chart  Preventive Care 65 Years and Older, Female Preventive care refers to lifestyle choices and visits with your health care provider that can promote health and wellness. Preventive care visits are also called wellness exams. What can I expect for my preventive care visit? Counseling Your health care provider may ask you questions about your: Medical history, including: Past medical problems. Family medical history. Pregnancy and menstrual history. History of falls. Current health, including: Memory and ability to understand (cognition). Emotional well-being. Home life and relationship well-being. Sexual activity and sexual health. Lifestyle, including: Alcohol, nicotine or tobacco, and drug use. Access to firearms. Diet, exercise, and sleep habits. Work and work Astronomer. Sunscreen use. Safety issues such as seatbelt and bike helmet use. Physical exam Your health care provider will check your: Height and weight. These may be used to calculate your BMI (body mass index). BMI is a measurement that tells if you are at a healthy weight. Waist circumference. This measures the distance around your waistline. This measurement also tells if you are at a healthy weight and may help predict your risk of certain diseases, such as type 2 diabetes and high blood pressure. Heart rate and blood pressure. Body temperature. Skin for abnormal spots. What immunizations do I need?  Vaccines are usually given at various ages, according to a schedule. Your health care provider will recommend vaccines for you based on your age, medical history, and lifestyle or other factors, such as travel or where you work. What tests do I  need? Screening Your health care provider may recommend screening tests for certain conditions. This may include: Lipid and cholesterol levels. Hepatitis C test. Hepatitis B test. HIV (human immunodeficiency virus) test. STI (sexually transmitted infection) testing, if you are at risk. Lung cancer screening. Colorectal cancer screening. Diabetes screening. This is done by checking your blood sugar (glucose) after you have not eaten for a while (fasting). Mammogram. Talk with your health care provider about how often you should have regular mammograms. BRCA-related cancer screening. This may be done if you have a family history of breast, ovarian, tubal, or peritoneal cancers. Bone density scan. This is done to screen for osteoporosis. Talk with your health care provider about your test results, treatment options, and if necessary, the need for more tests. Follow these instructions at home: Eating and drinking  Eat a diet that includes fresh fruits and vegetables, whole grains, lean protein, and low-fat dairy products. Limit your intake of foods with high amounts of sugar, saturated fats, and salt. Take vitamin and mineral supplements as recommended by your health care provider. Do not drink alcohol if your health care provider tells you not to drink. If you drink alcohol: Limit how much you have to 0-1 drink a day. Know how much alcohol is in your drink. In the [place] one drink equals one 12 oz bottle of beer (355 mL), one 5 oz glass of wine (148 mL), or one 1 oz glass of hard liquor (44 mL). Lifestyle Brush your teeth every morning and night with fluoride toothpaste. Floss one time each day. Exercise for at least 30 minutes 5 or more days each week. Do not use any products that contain nicotine or tobacco. These products include cigarettes,  chewing tobacco, and vaping devices, such as e-cigarettes. If you need help quitting, ask your health care provider. Do not use drugs. If you are  sexually active, practice safe sex. Use a condom or other form of protection in order to prevent STIs. Take aspirin only as told by your health care provider. Make sure that you understand how much to take and what form to take. Work with your health care provider to find out whether it is safe and beneficial for you to take aspirin daily. Ask your health care provider if you need to take a cholesterol-lowering medicine (statin). Find healthy ways to manage stress, such as: Meditation, yoga, or listening to music. Journaling. Talking to a trusted person. Spending time with friends and family. Minimize exposure to UV radiation to reduce your risk of skin cancer. Safety Always wear your seat belt while driving or riding in a vehicle. Do not drive: If you have been drinking alcohol. Do not ride with someone who has been drinking. When you are tired or distracted. While texting. If you have been using any mind-altering substances or drugs. Wear a helmet and other protective equipment during sports activities. If you have firearms in your house, make sure you follow all gun safety procedures. What's next? Visit your health care provider once a year for an annual wellness visit. Ask your health care provider how often you should have your eyes and teeth checked. Stay up to date on all vaccines. This information is not intended to replace advice given to you by your health care provider. Make sure you discuss any questions you have with your health care provider. Document Revised: 03/31/2021 Document Reviewed: 03/31/2021 Elsevier Patient Education  2024 [employer].

## 2024-07-09 ENCOUNTER — Ambulatory Visit: Payer: Self-pay | Admitting: Family

## 2024-07-09 NOTE — Anesthesia Preprocedure Evaluation (Signed)
 Anesthesia Evaluation  Patient identified by MRN, date of birth, ID band Patient awake    Reviewed: Allergy & Precautions, NPO status , Patient's Chart, lab work & pertinent test results  History of Anesthesia Complications (+) PONV and history of anesthetic complications  Airway Mallampati: I       Dental no notable dental hx. (+) Dental Advisory Given   Pulmonary    breath sounds clear to auscultation       Cardiovascular hypertension, Pt. on medications  Rhythm:Regular Rate:Normal     Neuro/Psych  Headaches Cervicalgia    GI/Hepatic   Endo/Other    Renal/GU negative Renal ROS     Musculoskeletal   Abdominal   Peds  Hematology   Anesthesia Other Findings   Reproductive/Obstetrics                              Anesthesia Physical Anesthesia Plan  ASA: 2  Anesthesia Plan: General   Post-op Pain Management:    Induction: Intravenous  PONV Risk Score and Plan: 1  Airway Management Planned: Oral ETT and Video Laryngoscope Planned  Additional Equipment:   Intra-op Plan:   Post-operative Plan: Extubation in OR  Informed Consent:      Dental advisory given  Plan Discussed with:   Anesthesia Plan Comments: (PAT note by Lynwood Hope, PA-C: 73 year old female follows with cardiology for history of HLD, recent diagnosis of atrial flutter and atrial fibrillation on Eliquis .  Last seen by Dr. Kennyth 06/25/2024 for preop evaluation.  Per note, No contraindication from EP perspective for neck surgery. She may have increased burden of atrial arrhythmias during the peri-operative period but this is not prohibitive. She can interrupt anti-coagulation as needed for surgery, usually 2 days prior and resume as soon as safe from surgical perspective. Will need echocardiogram completed for general cardiology clearance, defer to Dr. Jeffrie.  Subsequent echo 06/27/2024 showed LVEF 60 to 65%,  grade 1 DD, normal RV, mild mitral regurgitation.  Formal clearance and telephone encounter 07/02/2024 by Damien Braver, NP, Chart reviewed as part of pre-operative protocol coverage. Patient was recently seen by Dr. Jeffrie and Dr. Kennyth and was cleared for surgery.  Recent echocardiogram was reassuring. Therefore, given past medical history and time since last visit, based on ACC/AHA guidelines, Donna Guerra is at acceptable risk for the planned procedure without further cardiovascular testing. Per office protocol, patient can hold Eliquis  for 3 days prior to procedure.  Patient will not need bridging with Lovenox (enoxaparin) around procedure. Please resume Eliquis  as soon as possible postprocedure, at the discretion of the surgeon.   Other pertinent history includes PONV, migraines.  Preop labs reviewed, WNL.  EKG 06/25/2024: Sinus rhythm with Premature supraventricular complexes.  Rate 79. Nonspecific ST abnormality  TTE 06/27/2024: 1. Left ventricular ejection fraction, by estimation, is 60 to 65%. The  left ventricle has normal function. The left ventricle has no regional  wall motion abnormalities. Left ventricular diastolic parameters are  consistent with Grade I diastolic  dysfunction (impaired relaxation).  2. Right ventricular systolic function is normal. The right ventricular  size is normal. There is normal pulmonary artery systolic pressure. The  estimated right ventricular systolic pressure is 23.2 mmHg.  3. The mitral valve is abnormal. Mild mitral valve regurgitation.  4. The aortic valve is tricuspid. Aortic valve regurgitation is not  visualized.  5. The inferior vena cava is normal in size with greater than 50%  respiratory variability,  suggesting right atrial pressure of 3 mmHg.   Comparison(s): No prior Echocardiogram.   Event monitor 04/2024: 1. NSR with sinus brady (56/min) and sinus tachycardia, ave HR 89/min. 2. 4% atrial flutter with a RVR. 3. Frequent  episodes of NS SVT, longest 19 beats at 184/min. 4. No prolonged pauses. 5. Frequent PAC's (19%). 6. Symptoms associated with atrial flutter and SVT.   Eliquis  held > 3 days. Plan for GETA, PIV x 2 with ClearSight available as needed.  )         Anesthesia Quick Evaluation

## 2024-07-09 NOTE — Progress Notes (Signed)
 Anesthesia Chart Review:  73 year old female follows with cardiology for history of HLD, recent diagnosis of atrial flutter and atrial fibrillation on Eliquis .  Last seen by Dr. Kennyth 06/25/2024 for preop evaluation.  Per note, No contraindication from EP perspective for neck surgery. She may have increased burden of atrial arrhythmias during the peri-operative period but this is not prohibitive. She can interrupt anti-coagulation as needed for surgery, usually 2 days prior and resume as soon as safe from surgical perspective. Will need echocardiogram completed for general cardiology clearance, defer to Dr. Jeffrie.  Subsequent echo 06/27/2024 showed LVEF 60 to 65%, grade 1 DD, normal RV, mild mitral regurgitation.  Formal clearance and telephone encounter 07/02/2024 by Damien Braver, NP, Chart reviewed as part of pre-operative protocol coverage. Patient was recently seen by Dr. Jeffrie and Dr. Kennyth and was cleared for surgery.  Recent echocardiogram was reassuring. Therefore, given past medical history and time since last visit, based on ACC/AHA guidelines, Donna Guerra is at acceptable risk for the planned procedure without further cardiovascular testing. Per office protocol, patient can hold Eliquis  for 3 days prior to procedure.  Patient will not need bridging with Lovenox (enoxaparin) around procedure. Please resume Eliquis  as soon as possible postprocedure, at the discretion of the surgeon.   Other pertinent history includes PONV, migraines.  Preop labs reviewed, WNL.  EKG 06/25/2024: Sinus rhythm with Premature supraventricular complexes.  Rate 79. Nonspecific ST abnormality  TTE 06/27/2024: 1. Left ventricular ejection fraction, by estimation, is 60 to 65%. The  left ventricle has normal function. The left ventricle has no regional  wall motion abnormalities. Left ventricular diastolic parameters are  consistent with Grade I diastolic  dysfunction (impaired relaxation).   2. Right ventricular  systolic function is normal. The right ventricular  size is normal. There is normal pulmonary artery systolic pressure. The  estimated right ventricular systolic pressure is 23.2 mmHg.   3. The mitral valve is abnormal. Mild mitral valve regurgitation.   4. The aortic valve is tricuspid. Aortic valve regurgitation is not  visualized.   5. The inferior vena cava is normal in size with greater than 50%  respiratory variability, suggesting right atrial pressure of 3 mmHg.   Comparison(s): No prior Echocardiogram.   Event monitor 04/2024: NSR with sinus brady (56/min) and sinus tachycardia, ave HR 89/min. 4% atrial flutter with a RVR. Frequent episodes of NS SVT, longest 19 beats at 184/min. No prolonged pauses. Frequent PAC's (19%). Symptoms associated with atrial flutter and SVT.    Lynwood Geofm RIGGERS [facility] Short Stay Center/Anesthesiology Phone [phone] 07/09/2024 9:15 AM

## 2024-07-10 ENCOUNTER — Other Ambulatory Visit: Payer: Self-pay

## 2024-07-10 ENCOUNTER — Inpatient Hospital Stay (HOSPITAL_COMMUNITY): Payer: Self-pay

## 2024-07-10 ENCOUNTER — Inpatient Hospital Stay (HOSPITAL_COMMUNITY): Payer: Self-pay | Admitting: Physician Assistant

## 2024-07-10 ENCOUNTER — Inpatient Hospital Stay (HOSPITAL_COMMUNITY)
Admission: RE | Admit: 2024-07-10 | Discharge: 2024-07-11 | DRG: 472 | Disposition: A | Discharge status: Home / Self Care | Attending: Neurosurgery | Admitting: Neurosurgery

## 2024-07-10 ENCOUNTER — Encounter (HOSPITAL_COMMUNITY)
Admission: RE | Disposition: A | Discharge status: Home / Self Care | Payer: Self-pay | Source: Home / Self Care | Attending: Neurosurgery

## 2024-07-10 ENCOUNTER — Inpatient Hospital Stay (HOSPITAL_COMMUNITY)

## 2024-07-10 ENCOUNTER — Encounter (HOSPITAL_COMMUNITY): Payer: Self-pay | Admitting: Neurosurgery

## 2024-07-10 DIAGNOSIS — Z85828 Personal history of other malignant neoplasm of skin: Secondary | ICD-10-CM

## 2024-07-10 DIAGNOSIS — Z808 Family history of malignant neoplasm of other organs or systems: Secondary | ICD-10-CM

## 2024-07-10 DIAGNOSIS — M4712 Other spondylosis with myelopathy, cervical region: Secondary | ICD-10-CM | POA: Diagnosis present

## 2024-07-10 DIAGNOSIS — Z833 Family history of diabetes mellitus: Secondary | ICD-10-CM

## 2024-07-10 DIAGNOSIS — M542 Cervicalgia: Secondary | ICD-10-CM | POA: Diagnosis present

## 2024-07-10 DIAGNOSIS — L0211 Cutaneous abscess of neck: Secondary | ICD-10-CM | POA: Diagnosis present

## 2024-07-10 DIAGNOSIS — Z87410 Personal history of cervical dysplasia: Secondary | ICD-10-CM | POA: Diagnosis not present

## 2024-07-10 DIAGNOSIS — Z8249 Family history of ischemic heart disease and other diseases of the circulatory system: Secondary | ICD-10-CM | POA: Diagnosis not present

## 2024-07-10 DIAGNOSIS — M4722 Other spondylosis with radiculopathy, cervical region: Secondary | ICD-10-CM | POA: Diagnosis present

## 2024-07-10 DIAGNOSIS — Z803 Family history of malignant neoplasm of breast: Secondary | ICD-10-CM | POA: Diagnosis not present

## 2024-07-10 DIAGNOSIS — Z90710 Acquired absence of both cervix and uterus: Secondary | ICD-10-CM | POA: Diagnosis not present

## 2024-07-10 DIAGNOSIS — Z7901 Long term (current) use of anticoagulants: Secondary | ICD-10-CM | POA: Diagnosis not present

## 2024-07-10 DIAGNOSIS — E785 Hyperlipidemia, unspecified: Secondary | ICD-10-CM | POA: Diagnosis present

## 2024-07-10 DIAGNOSIS — Z85820 Personal history of malignant melanoma of skin: Secondary | ICD-10-CM | POA: Diagnosis not present

## 2024-07-10 DIAGNOSIS — M4802 Spinal stenosis, cervical region: Principal | ICD-10-CM | POA: Diagnosis present

## 2024-07-10 DIAGNOSIS — Z800 Family history of malignant neoplasm of digestive organs: Secondary | ICD-10-CM

## 2024-07-10 DIAGNOSIS — M8580 Other specified disorders of bone density and structure, unspecified site: Secondary | ICD-10-CM | POA: Diagnosis present

## 2024-07-10 HISTORY — PX: ANTERIOR CERVICAL DECOMP/DISCECTOMY FUSION: SHX1161

## 2024-07-10 LAB — ABO/RH: ABO/RH(D): O POS

## 2024-07-10 SURGERY — ANTERIOR CERVICAL DECOMPRESSION/DISCECTOMY FUSION 3 LEVELS
Anesthesia: General

## 2024-07-10 MED ORDER — SODIUM CHLORIDE 0.9% FLUSH: 3.0000 mL | INTRAVENOUS | Status: DC | PRN

## 2024-07-10 MED ORDER — ORAL CARE MOUTH RINSE: 15.0000 mL | Freq: Once | OROMUCOSAL | Status: AC

## 2024-07-10 MED ORDER — SODIUM CHLORIDE 0.9 % IV SOLN
250.0000 mL | INTRAVENOUS | Status: DC
Start: 2024-07-10 — End: 2024-07-11

## 2024-07-10 MED ORDER — THROMBIN 20000 UNITS EX SOLR
CUTANEOUS | Status: DC | PRN
  Administered 2024-07-10: 20 mL via TOPICAL

## 2024-07-10 MED ORDER — THROMBIN 5000 UNITS EX KIT
PACK | CUTANEOUS | Status: AC
  Filled 2024-07-10: qty 1

## 2024-07-10 MED ORDER — CHLORHEXIDINE GLUCONATE CLOTH 2 % EX PADS: 6.0000 | MEDICATED_PAD | Freq: Once | CUTANEOUS | Status: DC

## 2024-07-10 MED ORDER — PHENYLEPHRINE 80 MCG/ML (10ML) SYRINGE FOR IV PUSH (FOR BLOOD PRESSURE SUPPORT)
PREFILLED_SYRINGE | INTRAVENOUS | Status: DC | PRN
Start: 2024-07-10 — End: 2024-07-10
  Administered 2024-07-10 (×2): 40 ug via INTRAVENOUS

## 2024-07-10 MED ORDER — CYCLOBENZAPRINE HCL 10 MG PO TABS
10.0000 mg | ORAL_TABLET | Freq: Three times a day (TID) | ORAL | Status: DC | PRN
  Administered 2024-07-10 – 2024-07-11 (×2): 10 mg via ORAL
  Filled 2024-07-10 (×2): qty 1

## 2024-07-10 MED ORDER — TRETINOIN 0.1 % EX CREA: 1.0000 | TOPICAL_CREAM | CUTANEOUS | Status: DC

## 2024-07-10 MED ORDER — PHENOL 1.4 % MT LIQD
1.0000 | OROMUCOSAL | Status: DC | PRN
  Administered 2024-07-10: 1 via OROMUCOSAL
  Filled 2024-07-10: qty 177

## 2024-07-10 MED ORDER — NAPHAZOLINE-GLYCERIN 0.012-0.25 % OP SOLN: 1.0000 [drp] | Freq: Every day | OPHTHALMIC | Status: DC | PRN

## 2024-07-10 MED ORDER — DILTIAZEM HCL 30 MG PO TABS: 30.0000 mg | ORAL_TABLET | ORAL | Status: DC | PRN

## 2024-07-10 MED ORDER — EPHEDRINE SULFATE-NACL 50-0.9 MG/10ML-% IV SOSY: PREFILLED_SYRINGE | INTRAVENOUS | Status: DC | PRN

## 2024-07-10 MED ORDER — ATORVASTATIN CALCIUM 40 MG PO TABS
40.0000 mg | ORAL_TABLET | Freq: Every day | ORAL | Status: DC
  Administered 2024-07-10 – 2024-07-11 (×2): 40 mg via ORAL
  Filled 2024-07-10 (×2): qty 1

## 2024-07-10 MED ORDER — PROPOFOL 10 MG/ML IV BOLUS
INTRAVENOUS | Status: DC | PRN
Start: 2024-07-10 — End: 2024-07-10
  Administered 2024-07-10: 20 mg via INTRAVENOUS
  Administered 2024-07-10: 60 mg via INTRAVENOUS

## 2024-07-10 MED ORDER — MIDAZOLAM HCL 2 MG/2ML IJ SOLN
INTRAMUSCULAR | Status: AC
  Filled 2024-07-10: qty 2

## 2024-07-10 MED ORDER — VITAMIN D 25 MCG (1000 UNIT) PO TABS
1000.0000 [IU] | ORAL_TABLET | Freq: Every day | ORAL | Status: DC
  Administered 2024-07-10 – 2024-07-11 (×2): 1000 [IU] via ORAL
  Filled 2024-07-10 (×2): qty 1

## 2024-07-10 MED ORDER — LACTATED RINGERS IV SOLN: INTRAVENOUS | Status: DC

## 2024-07-10 MED ORDER — OXYCODONE HCL 5 MG/5ML PO SOLN: 5.0000 mg | Freq: Once | ORAL | Status: AC | PRN

## 2024-07-10 MED ORDER — DEXAMETHASONE SODIUM PHOSPHATE 10 MG/ML IJ SOLN
INTRAMUSCULAR | Status: DC | PRN
  Administered 2024-07-10: 10 mg via INTRAVENOUS

## 2024-07-10 MED ORDER — ACETAMINOPHEN 10 MG/ML IV SOLN
1000.0000 mg | Freq: Once | INTRAVENOUS | Status: DC | PRN
  Administered 2024-07-10: 1000 mg via INTRAVENOUS

## 2024-07-10 MED ORDER — MIDAZOLAM HCL 2 MG/2ML IJ SOLN
INTRAMUSCULAR | Status: DC | PRN
Start: 2024-07-10 — End: 2024-07-10
  Administered 2024-07-10: 1 mg via INTRAVENOUS

## 2024-07-10 MED ORDER — ACETAMINOPHEN 650 MG RE SUPP: 650.0000 mg | RECTAL | Status: DC | PRN

## 2024-07-10 MED ORDER — OXYCODONE HCL 5 MG PO TABS
ORAL_TABLET | ORAL | Status: AC
  Filled 2024-07-10: qty 1

## 2024-07-10 MED ORDER — ONDANSETRON HCL 4 MG/2ML IJ SOLN: 4.0000 mg | Freq: Four times a day (QID) | INTRAMUSCULAR | Status: DC | PRN

## 2024-07-10 MED ORDER — SUMATRIPTAN SUCCINATE 100 MG PO TABS
100.0000 mg | ORAL_TABLET | ORAL | Status: DC | PRN
  Administered 2024-07-11: 100 mg via ORAL
  Filled 2024-07-10 (×2): qty 1

## 2024-07-10 MED ORDER — DROPERIDOL 2.5 MG/ML IJ SOLN: 0.6250 mg | Freq: Once | INTRAMUSCULAR | Status: DC | PRN

## 2024-07-10 MED ORDER — PANTOPRAZOLE SODIUM 40 MG IV SOLR
40.0000 mg | Freq: Every day | INTRAVENOUS | Status: DC
Start: 2024-07-10 — End: 2024-07-11
  Administered 2024-07-10: 40 mg via INTRAVENOUS
  Filled 2024-07-10: qty 10

## 2024-07-10 MED ORDER — HYDROCODONE-ACETAMINOPHEN 5-325 MG PO TABS
2.0000 | ORAL_TABLET | ORAL | Status: DC | PRN
  Administered 2024-07-10 – 2024-07-11 (×3): 2 via ORAL
  Filled 2024-07-10 (×3): qty 2

## 2024-07-10 MED ORDER — PHENYLEPHRINE HCL-NACL 20-0.9 MG/250ML-% IV SOLN
INTRAVENOUS | Status: DC | PRN
  Administered 2024-07-10: 40 ug via INTRAVENOUS
  Administered 2024-07-10: 20 ug/min via INTRAVENOUS

## 2024-07-10 MED ORDER — ROCURONIUM BROMIDE 10 MG/ML (PF) SYRINGE
PREFILLED_SYRINGE | INTRAVENOUS | Status: AC
  Filled 2024-07-10: qty 10

## 2024-07-10 MED ORDER — FENTANYL CITRATE (PF) 100 MCG/2ML IJ SOLN
INTRAMUSCULAR | Status: AC
  Filled 2024-07-10: qty 2

## 2024-07-10 MED ORDER — SODIUM CHLORIDE 0.9% FLUSH
3.0000 mL | Freq: Two times a day (BID) | INTRAVENOUS | Status: DC
Start: 2024-07-10 — End: 2024-07-11
  Administered 2024-07-10: 3 mL via INTRAVENOUS

## 2024-07-10 MED ORDER — CHLORHEXIDINE GLUCONATE 0.12 % MT SOLN
OROMUCOSAL | Status: AC
  Administered 2024-07-10: 15 mL via OROMUCOSAL
  Filled 2024-07-10: qty 15

## 2024-07-10 MED ORDER — FENTANYL CITRATE (PF) 250 MCG/5ML IJ SOLN
INTRAMUSCULAR | Status: DC | PRN
  Administered 2024-07-10 (×3): 50 ug via INTRAVENOUS

## 2024-07-10 MED ORDER — ONDANSETRON HCL 4 MG PO TABS: 4.0000 mg | ORAL_TABLET | Freq: Four times a day (QID) | ORAL | Status: DC | PRN

## 2024-07-10 MED ORDER — PROPOFOL 10 MG/ML IV BOLUS
INTRAVENOUS | Status: AC
  Filled 2024-07-10: qty 20

## 2024-07-10 MED ORDER — THROMBIN 5000 UNITS EX SOLR
OROMUCOSAL | Status: DC | PRN
  Administered 2024-07-10: 5 mL via TOPICAL

## 2024-07-10 MED ORDER — FENTANYL CITRATE (PF) 100 MCG/2ML IJ SOLN
25.0000 ug | INTRAMUSCULAR | Status: DC | PRN
  Administered 2024-07-10: 25 ug via INTRAVENOUS
  Administered 2024-07-10: 50 ug via INTRAVENOUS
  Administered 2024-07-10: 25 ug via INTRAVENOUS

## 2024-07-10 MED ORDER — OXYCODONE HCL 5 MG PO TABS
5.0000 mg | ORAL_TABLET | Freq: Once | ORAL | Status: AC | PRN
  Administered 2024-07-10: 5 mg via ORAL

## 2024-07-10 MED ORDER — LIDOCAINE 2% (20 MG/ML) 5 ML SYRINGE
INTRAMUSCULAR | Status: DC | PRN
  Administered 2024-07-10: 20 mg via INTRAVENOUS
  Administered 2024-07-10: 80 mg via INTRAVENOUS

## 2024-07-10 MED ORDER — OMEGA-3 FATTY ACIDS 1000 MG PO CAPS
1.0000 g | ORAL_CAPSULE | Freq: Every day | ORAL | Status: DC
Start: 2024-07-10 — End: 2024-07-10

## 2024-07-10 MED ORDER — ONDANSETRON HCL 4 MG/2ML IJ SOLN
INTRAMUSCULAR | Status: DC | PRN
  Administered 2024-07-10: 4 mg via INTRAVENOUS

## 2024-07-10 MED ORDER — 0.9 % SODIUM CHLORIDE (POUR BTL) OPTIME
TOPICAL | Status: DC | PRN
  Administered 2024-07-10: 1000 mL

## 2024-07-10 MED ORDER — THROMBIN 20000 UNITS EX SOLR
CUTANEOUS | Status: AC
  Filled 2024-07-10: qty 20000

## 2024-07-10 MED ORDER — CEFAZOLIN SODIUM-DEXTROSE 2-4 GM/100ML-% IV SOLN
INTRAVENOUS | Status: AC
  Filled 2024-07-10: qty 100

## 2024-07-10 MED ORDER — ACETAMINOPHEN 325 MG PO TABS: 650.0000 mg | ORAL_TABLET | ORAL | Status: DC | PRN

## 2024-07-10 MED ORDER — ACETAMINOPHEN 10 MG/ML IV SOLN
INTRAVENOUS | Status: AC
Start: 2024-07-10 — End: 2024-07-10
  Filled 2024-07-10: qty 100

## 2024-07-10 MED ORDER — FENTANYL CITRATE (PF) 250 MCG/5ML IJ SOLN
INTRAMUSCULAR | Status: AC
  Filled 2024-07-10: qty 5

## 2024-07-10 MED ORDER — OMEGA-3-ACID ETHYL ESTERS 1 G PO CAPS
1.0000 g | ORAL_CAPSULE | Freq: Every day | ORAL | Status: DC
  Administered 2024-07-10 – 2024-07-11 (×2): 1 g via ORAL
  Filled 2024-07-10 (×2): qty 1

## 2024-07-10 MED ORDER — CEFAZOLIN SODIUM-DEXTROSE 2-4 GM/100ML-% IV SOLN
2.0000 g | Freq: Three times a day (TID) | INTRAVENOUS | Status: AC
  Administered 2024-07-10 – 2024-07-11 (×2): 2 g via INTRAVENOUS
  Filled 2024-07-10 (×2): qty 100

## 2024-07-10 MED ORDER — CEFAZOLIN SODIUM-DEXTROSE 2-4 GM/100ML-% IV SOLN
2.0000 g | INTRAVENOUS | Status: AC
  Administered 2024-07-10: 2 g via INTRAVENOUS

## 2024-07-10 MED ORDER — ONDANSETRON HCL 4 MG/2ML IJ SOLN
INTRAMUSCULAR | Status: AC
  Filled 2024-07-10: qty 2

## 2024-07-10 MED ORDER — LACTATED RINGERS IV SOLN
INTRAVENOUS | Status: DC
Start: 2024-07-10 — End: 2024-07-10

## 2024-07-10 MED ORDER — HYDROMORPHONE HCL 1 MG/ML IJ SOLN
0.5000 mg | INTRAMUSCULAR | Status: DC | PRN
  Administered 2024-07-11: 0.5 mg via INTRAVENOUS
  Filled 2024-07-10: qty 0.5

## 2024-07-10 MED ORDER — ONDANSETRON HCL 4 MG/2ML IJ SOLN: 4.0000 mg | Freq: Once | INTRAMUSCULAR | Status: DC | PRN

## 2024-07-10 MED ORDER — CHLORHEXIDINE GLUCONATE 0.12 % MT SOLN: 15.0000 mL | Freq: Once | OROMUCOSAL | Status: AC

## 2024-07-10 MED ORDER — SUGAMMADEX SODIUM 200 MG/2ML IV SOLN
INTRAVENOUS | Status: DC | PRN
  Administered 2024-07-10: 150 mg via INTRAVENOUS

## 2024-07-10 MED ORDER — LIDOCAINE 2% (20 MG/ML) 5 ML SYRINGE
INTRAMUSCULAR | Status: AC
  Filled 2024-07-10: qty 5

## 2024-07-10 MED ORDER — MENTHOL 3 MG MT LOZG: 1.0000 | LOZENGE | OROMUCOSAL | Status: DC | PRN

## 2024-07-10 MED ORDER — ALUM & MAG HYDROXIDE-SIMETH 200-200-20 MG/5ML PO SUSP: 30.0000 mL | Freq: Four times a day (QID) | ORAL | Status: DC | PRN

## 2024-07-10 MED ORDER — ROCURONIUM BROMIDE 10 MG/ML (PF) SYRINGE
PREFILLED_SYRINGE | INTRAVENOUS | Status: DC | PRN
Start: 2024-07-10 — End: 2024-07-10
  Administered 2024-07-10: 50 mg via INTRAVENOUS
  Administered 2024-07-10: 20 mg via INTRAVENOUS

## 2024-07-10 SURGICAL SUPPLY — 54 items
BAG COUNTER SPONGE SURGICOUNT (BAG) ×1 IMPLANT
BAND RUBBER #18 3X1/16 STRL (MISCELLANEOUS) ×2 IMPLANT
BASKET BONE COLLECTION (BASKET) ×1 IMPLANT
BENZOIN TINCTURE PRP APPL 2/3 (GAUZE/BANDAGES/DRESSINGS) ×1 IMPLANT
BIT DRILL 13 (BIT) IMPLANT
BIT DRILL NEURO 2X3.1 SFT TUCH (MISCELLANEOUS) ×1 IMPLANT
BUR MATCHSTICK NEURO 3.0 LAGG (BURR) ×1 IMPLANT
CANISTER SUCTION 3000ML PPV (SUCTIONS) ×1 IMPLANT
CHIPS BONE CANC-ACS11X14X6 6D (Bone Implant) IMPLANT
DERMABOND ADVANCED .7 DNX12 (GAUZE/BANDAGES/DRESSINGS) ×1 IMPLANT
DRAIN JP 7F FLT 3/4 PRF SI HBL (DRAIN) IMPLANT
DRAPE C-ARM 42X72 X-RAY (DRAPES) ×2 IMPLANT
DRAPE LAPAROTOMY 100X72 PEDS (DRAPES) ×1 IMPLANT
DRAPE MICROSCOPE SLANT 54X150 (MISCELLANEOUS) ×1 IMPLANT
DRSG OPSITE POSTOP 4X6 (GAUZE/BANDAGES/DRESSINGS) IMPLANT
DURAPREP 6ML APPLICATOR 50/CS (WOUND CARE) ×1 IMPLANT
ELECT COATED BLADE 2.86 ST (ELECTRODE) ×1 IMPLANT
ELECTRODE REM PT RTRN 9FT ADLT (ELECTROSURGICAL) ×1 IMPLANT
EVACUATOR SILICONE 100CC (DRAIN) IMPLANT
GAUZE 4X4 16PLY ~~LOC~~+RFID DBL (SPONGE) IMPLANT
GAUZE SPONGE 4X4 12PLY STRL (GAUZE/BANDAGES/DRESSINGS) ×1 IMPLANT
GLOVE BIO SURGEON STRL SZ7 (GLOVE) IMPLANT
GLOVE BIO SURGEON STRL SZ8 (GLOVE) ×1 IMPLANT
GLOVE BIOGEL PI IND STRL 7.0 (GLOVE) IMPLANT
GLOVE EXAM NITRILE XL STR (GLOVE) IMPLANT
GLOVE INDICATOR 8.5 STRL (GLOVE) ×1 IMPLANT
GOWN STRL REUS W/ TWL LRG LVL3 (GOWN DISPOSABLE) ×1 IMPLANT
GOWN STRL REUS W/ TWL XL LVL3 (GOWN DISPOSABLE) ×1 IMPLANT
GOWN STRL REUS W/TWL 2XL LVL3 (GOWN DISPOSABLE) IMPLANT
HALTER HD/CHIN CERV TRACTION D (MISCELLANEOUS) ×1 IMPLANT
HEMOSTAT POWDER KIT SURGIFOAM (HEMOSTASIS) ×1 IMPLANT
KIT BASIN OR (CUSTOM PROCEDURE TRAY) ×1 IMPLANT
KIT TURNOVER KIT B (KITS) ×1 IMPLANT
NDL SPNL 20GX3.5 QUINCKE YW (NEEDLE) ×1 IMPLANT
NEEDLE SPNL 20GX3.5 QUINCKE YW (NEEDLE) ×1 IMPLANT
PACK LAMINECTOMY NEURO (CUSTOM PROCEDURE TRAY) ×1 IMPLANT
PIN DISTRACTION 14MM (PIN) IMPLANT
PLATE 3 57.5XLCK NS SPNE CVD (Plate) IMPLANT
SCREW BN 13X4XSLF DRL FXANG (Screw) IMPLANT
SCREW ST FIX 4 ATL 3120213 (Screw) IMPLANT
SOLN 0.9% NACL 1000 ML (IV SOLUTION) ×1 IMPLANT
SOLN 0.9% NACL POUR BTL 1000ML (IV SOLUTION) ×1 IMPLANT
SOLN STERILE WATER 1000 ML (IV SOLUTION) ×1 IMPLANT
SOLN STERILE WATER BTL 1000 ML (IV SOLUTION) ×1 IMPLANT
SPIKE FLUID TRANSFER (MISCELLANEOUS) ×1 IMPLANT
SPONGE INTESTINAL PEANUT (DISPOSABLE) ×1 IMPLANT
SPONGE SURGIFOAM ABS GEL 100 (HEMOSTASIS) ×1 IMPLANT
STRIP CLOSURE SKIN 1/2X4 (GAUZE/BANDAGES/DRESSINGS) ×1 IMPLANT
SUT VIC AB 3-0 SH 8-18 (SUTURE) ×1 IMPLANT
SUT VIC AB 4-0 PS2 27 (SUTURE) ×1 IMPLANT
TAPE CLOTH 4X10 WHT NS (GAUZE/BANDAGES/DRESSINGS) IMPLANT
TOWEL GREEN STERILE (TOWEL DISPOSABLE) ×1 IMPLANT
TOWEL GREEN STERILE FF (TOWEL DISPOSABLE) ×1 IMPLANT
TRAY FOLEY MTR SLVR 14FR STAT (SET/KITS/TRAYS/PACK) IMPLANT

## 2024-07-10 NOTE — Transfer of Care (Signed)
 Immediate Anesthesia Transfer of Care Note  Patient: Donna Guerra  Procedure(s) Performed: ANTERIOR CERVICAL DECOMPRESSION/DISCECTOMY FUSION CERVICAL THREE-CERVICAL FOUR,CERVICAL FOUR-CERVICAL FIVE,CERVICAL FIVE-CERVICAL SIX  Patient Location: PACU  Anesthesia Type:General  Level of Consciousness: awake, alert , and sedated  Airway & Oxygen Therapy: Patient Spontanous Breathing and Patient connected to face mask oxygen  Post-op Assessment: Report given to RN and Post -op Vital signs reviewed and stable  Post vital signs: Reviewed and stable  Last Vitals:  Vitals Value Taken Time  BP 136/88 07/10/24 14:15  Temp    Pulse 100 07/10/24 14:16  Resp 7 07/10/24 14:16  SpO2 94 % 07/10/24 14:16  Vitals shown include unfiled device data.  Last Pain:  Vitals:   07/10/24 1004  TempSrc:   PainSc: 0-No pain         Complications: No notable events documented.

## 2024-07-10 NOTE — Anesthesia Procedure Notes (Signed)
 Procedure Name: Intubation Date/Time: 07/10/2024 11:52 AM  Performed by: Shavy Beachem C, CRNAPre-anesthesia Checklist: Patient identified, Emergency Drugs available, Suction available and Patient being monitored Patient Re-evaluated:Patient Re-evaluated prior to induction Oxygen Delivery Method: Circle system utilized Preoxygenation: Pre-oxygenation with 100% oxygen Induction Type: IV induction Ventilation: Mask ventilation without difficulty Laryngoscope Size: Glidescope and 3 Grade View: Grade I Tube type: Oral Tube size: 7.0 mm Number of attempts: 1 Airway Equipment and Method: Oral airway and Rigid stylet Placement Confirmation: ETT inserted through vocal cords under direct vision, positive ETCO2 and breath sounds checked- equal and bilateral Secured at: 20 cm Tube secured with: Tape Dental Injury: Teeth and Oropharynx as per pre-operative assessment

## 2024-07-10 NOTE — Op Note (Signed)
 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  Postoperative diagnosis: Same.  Procedure: Anterior cervical discectomies and fusion C3-4 C4-5 C5-6 utilizing allograft spacers and anterior cervical plating utilizing the [employer] Atlantis translational plating system.  Surgeon: Arley helling.  Assistant: Suzen Click.  Anesthesia: General.  EBL: Minimal.  HPI: 73 year old female progressive worsening neck pain left shoulder and arm pain numbness tingling in her hand difficulty walking.  Workup revealed severe cervical stenosis with spondylosis C3-4 C4-5 C5-6.  Due to the patient's progression of clinical syndrome imaging findings of a conservative treatment I recommended anterior cervical discectomies and fusion at those levels.  I extensively reviewed the risks and benefits of the operation with her as well as perioperative course expectations of outcome and alternatives to surgery and she understood and agreed to proceed forward.  Operative procedure: Patient was brought into the OR was induced under general anesthesia positioned supine the neck in slight extension 5 pounds of halter traction.  The right side of her neck was prepped and draped in routine sterile fashion.  Preoperative x-ray localized the appropriate level.  So a curvilinear incision was made just off midline to the antral border of the sternocleidomastoid and the superficial abscess was dissected longitudinally.  The avascular plane between the sternomastoid and strap muscle was developed down to the prevertebral fascia and prevertebral fascia was dissected away with Kitners.  Interoperative x-ray confirmed application appropriate level.  So anterior ossified tubing off of the Leksell rongeur and a 2 and 3 Miller Kerrison punch longus close reflected laterally and self-retaining retractors were placed.  First working at C4-5 and C5-6 both the space were drilled down to  the posterior annulus and osteophyte complex.  This also was performed at C3-4.  Then the operating microscope was brought into the field and under microscopic lamination first working at C3-4 disc base was further cleaned out endplates were prepared there was large central disc herniation this was all removed in piecemeal fashion under biting of both endplates and removal of posterior large ligament decompress central canal and both C4 nerve roots were skeletonized flush with the pedicle.  Then this was packed tension to take into C4-5 in a similar fashion C4-5 was drilled down to the posterior annulus ossified complex large osteophytes were under Bitton and removed from the endplates of C4 and C5 posterior logical ligament was removed both C5 nerve roots were skeletonized flush to the pedicle and decompressed.  Then at C5-6 in a similar fashion marked spondylosis and collapse this was all aggressively drilled away and removed under biting both endplates decompressing and skeletonizing both C6 nerve roots flush of the pedicle.  At the end of discectomy there is no further stenosis either centrally or foraminally I sized up three 6 mm lordotic allograft implants and inserted them at C3-4 C4-5 and C5-6.  I then selected a 57.5 mm Lance translational plate all screws with excellent purchase locking mechanism was engaged postop imaging confirmed good position of all the implants so I then placed a JP drain and the wound was closed in layers with operative Vicryl and platysma and a running 4 subcuticular.  Dermabond benzoin Steri-Strips and sterile dressing was applied patient to cover him in stable condition.  At the end the case all needle count sponge counts were correct.

## 2024-07-10 NOTE — H&P (Signed)
 Donna Guerra is an 73 y.o. female.   Chief Complaint: Neck bilateral shoulder and arm pain HPI: 73 year old female with longstanding issues with her neck bilateral shoulder and arm pain workup revealed severe spondylosis spinal stenosis and cord compression at the C4-5 C5-6 and C3-4 showed dynamic instability on cervical flexion-extension films.  So due to her progression of clinical syndrome imaging findings of failed conservative treatment I recommended anterior server discectomy and fusion at C3-4 C4-5 and C5-6.  I extensively reviewed the risks and benefits of the operation with the patient as well as perioperative course expectations of outcome and alternatives of surgery and she understood and agreed to proceed forward.  Past Medical History:  Diagnosis Date   Atrial flutter (HCC)    Back pain 04/11/2017   CIN I (cervical intraepithelial neoplasia I) 2005   Colon polyps 07/08/2012   Hyperglycemia 06/13/2017   Hyperlipidemia 08/29/2012   Melanoma in situ of right shoulder (HCC) 03/17/2020   Migraines    Osteopenia 03/2018   T score -1.6 FRAX 11% / 2.9% stable from prior DEXA   PONV (postoperative nausea and vomiting)    Right shoulder pain 07/08/2012   Skin cancer 07/08/2012   Basal cell    Past Surgical History:  Procedure Laterality Date   ABDOMINAL SURGERY     Tummy tuck   CESAREAN SECTION  1978 and 1981   CHOLECYSTECTOMY     COLPOSCOPY     COSMETIC SURGERY     GYNECOLOGIC CRYOSURGERY     OOPHORECTOMY  2002   BSO   SHOULDER SURGERY     right rotator cuff repair   TONSILLECTOMY AND ADENOIDECTOMY     age 57   TUBAL LIGATION     VAGINAL HYSTERECTOMY  2002   LAVH BSO    Family History  Problem Relation Age of Onset   Hypertension Mother    Breast cancer Mother 21   Colon cancer Mother 42   Migraines Father    Migraines Sister    Cancer Sister        Melanoma   Breast cancer Sister 52   Cirrhosis Sister    Breast cancer Maternal Aunt 59   Breast cancer  Maternal Aunt        50's   Breast cancer Paternal Aunt        dx over 16   Diabetes Maternal Grandmother    Prostate cancer Cousin        maternal first cousin   Colon cancer Cousin        maternal first cousin   Cancer Cousin        unknown cancer   Breast cancer Cousin 27       TN breast cancer - maternal first cousin   Breast cancer Niece 104       DCIS   Social History:  reports that she has never smoked. She has never used smokeless tobacco. She reports that she does not currently use alcohol. She reports that she does not use drugs.  Allergies: No Known Allergies  Medications Prior to Admission  Medication Sig Dispense Refill   apixaban  (ELIQUIS ) 5 MG TABS tablet Take 1 tablet (5 mg total) by mouth 2 (two) times daily. 180 tablet 3   atorvastatin  (LIPITOR) 40 MG tablet Take 1 tablet (40 mg total) by mouth daily. 90 tablet 1   cholecalciferol  (VITAMIN D3) 25 MCG (1000 UNIT) tablet Take 1,000 Units by mouth daily.     diltiazem  (CARDIZEM ) 30  MG tablet Take 1 tablet (30 mg total) by mouth every 4 (four) hours as needed. For rapid heart rate 30 tablet 4   Erenumab -aooe (AIMOVIG ) 70 MG/ML SOAJ 70 mg sq qmo 1.12 mL 5   fish oil-omega-3 fatty acids  1000 MG capsule Take 1 g by mouth daily.     naphazoline-glycerin  (CLEAR EYES REDNESS) 0.012-0.25 % SOLN Place 1-2 drops into both eyes daily as needed for eye irritation.     NONFORMULARY OR COMPOUNDED ITEM Estradiol  vaginal cream 0.02% 1 ml prefilled syringe, apply 1 ml per vagina twice weekly.  Disp:  28, RF 12.  [facility]. 28 each 12   RETIN-A  0.1 % cream Apply 1 application  topically 2 (two) times a week.     SUMAtriptan  (IMITREX ) 100 MG tablet TAKE 1 TABLET BY MOUTH. MAY REPEAT IN 2 HOURS IF HEADACHE PERSISTS OR RECURS APPOINTMENT NEEDED FOR FURTHER REFILLS 9 tablet 11    Results for orders placed or performed during the hospital encounter of 07/10/24 (from the past 48 hours)  ABO/Rh     Status: None (Preliminary result)    Collection Time: 07/10/24 10:20 AM  Result Value Ref Range   ABO/RH(D) PENDING    No results found.  Review of Systems  Musculoskeletal:  Positive for neck pain.  Neurological:  Positive for numbness.    Blood pressure 127/79, pulse 71, temperature 98.2 F (36.8 C), temperature source Oral, resp. rate 18, height 5' 3 (1.6 m), weight 56.4 kg, SpO2 99%. Physical Exam HENT:     Head: Normocephalic.     Right Ear: Tympanic membrane normal.     Nose: Nose normal.  Cardiovascular:     Rate and Rhythm: Normal rate.     Pulses: Normal pulses.  Pulmonary:     Effort: Pulmonary effort is normal.  Musculoskeletal:        General: Normal range of motion.     Cervical back: Normal range of motion.  Skin:    General: Skin is warm.  Neurological:     Mental Status: She is alert.     Comments: Strength is 5 out of 5 deltoid, bicep, tricep, wrist flexion, wrist extension, hand intrinsics.      Assessment/Plan 73 year old presents for ACDF C3-4 C4-5 C5-6  [name], MD 07/10/2024, 10:50 AM

## 2024-07-10 NOTE — Anesthesia Postprocedure Evaluation (Signed)
 Anesthesia Post Note  Patient: Donna Guerra  Procedure(s) Performed: ANTERIOR CERVICAL DECOMPRESSION/DISCECTOMY FUSION CERVICAL THREE-CERVICAL FOUR,CERVICAL FOUR-CERVICAL FIVE,CERVICAL FIVE-CERVICAL SIX     Patient location during evaluation: PACU Anesthesia Type: General Level of consciousness: awake and patient cooperative Pain management: pain level controlled Vital Signs Assessment: post-procedure vital signs reviewed and stable Respiratory status: spontaneous breathing Cardiovascular status: blood pressure returned to baseline Postop Assessment: no headache and no apparent nausea or vomiting Anesthetic complications: no   No notable events documented.  Last Vitals:  Vitals:   07/10/24 1615 07/10/24 1646  BP: 130/72 (!) 154/76  Pulse: 81 69  Resp: 12 16  Temp:    SpO2: 94% 100%    Last Pain:  Vitals:   07/10/24 1728  TempSrc:   PainSc: 8                  Lauraine KATHEE [place]

## 2024-07-11 MED ORDER — HYDROCODONE-ACETAMINOPHEN 5-325 MG PO TABS: 1.0000 | ORAL_TABLET | ORAL | 0 refills | Status: DC | PRN

## 2024-07-11 MED ORDER — HYDROCODONE-ACETAMINOPHEN 5-325 MG PO TABS
1.0000 | ORAL_TABLET | ORAL | Status: DC | PRN
  Administered 2024-07-11: 1 via ORAL
  Filled 2024-07-11: qty 1

## 2024-07-11 MED ORDER — DOCUSATE SODIUM 100 MG PO CAPS: 100.0000 mg | ORAL_CAPSULE | Freq: Two times a day (BID) | ORAL | Status: DC | PRN

## 2024-07-11 MED ORDER — CYCLOBENZAPRINE HCL 10 MG PO TABS: 10.0000 mg | ORAL_TABLET | Freq: Three times a day (TID) | ORAL | 0 refills | Status: AC | PRN

## 2024-07-11 MED ORDER — SENNA 8.6 MG PO TABS
1.0000 | ORAL_TABLET | Freq: Two times a day (BID) | ORAL | Status: DC | PRN
Start: 2024-07-11 — End: 2024-07-11

## 2024-07-11 NOTE — Plan of Care (Signed)
Pt doing well. Pt given D/C instructions with verbal understanding. Rx's were sent to the pharmacy by MD. Pt's incision is clean and dry with no sign of infection. Pt's IV and JP drain were removed prior to D/C.  Pt D/C'd home via wheelchair per MD order. Pt is stable @ D/C and has no other needs at this time. Lauranne Beyersdorf, RN  

## 2024-07-11 NOTE — Discharge Summary (Signed)
 Physician Discharge Summary  Patient ID: Donna Guerra MRN: [id number] DOB/AGE: November 13, 1950 73 y.o.  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     Discharge Diagnoses: same   Discharged Condition: good  Hospital Course: The patient was admitted on 07/10/2024 and taken to the operating room where the patient underwent acdf C3-4, 4-5, 5-6. The patient tolerated the procedure well and was taken to the recovery room and then to the floor in stable condition. The hospital course was routine. There were no complications. The wound remained clean dry and intact. Pt had appropriate neck soreness. No complaints of arm pain or new N/T/W. The patient remained afebrile with stable vital signs, and tolerated a regular diet. The patient continued to increase activities, and pain was well controlled with oral pain medications.   Consults: None  Significant Diagnostic Studies:  Results for orders placed or performed during the hospital encounter of 07/10/24  ABO/Rh   Collection Time: 07/10/24 10:20 AM  Result Value Ref Range   ABO/RH(D)      O POS Performed at [facility], [address] [place], [place] [address]     DG Cervical Spine 1 View Result Date: 07/10/2024 CLINICAL DATA:  C3-C6 ACDF EXAM: DG CERVICAL SPINE - 1 VIEW COMPARISON:  06/18/2024 FINDINGS: Intraoperative spot images. Changes of ACDF from C3-C6. No hardware complicating feature. Normal alignment. IMPRESSION: ACDF C3-C6.  No visible complicating feature. Electronically Signed   By: Franky Crease M.D.   On: 07/10/2024 18:24   DG C-Arm 1-60 Min-No Report Result Date: 07/10/2024 Fluoroscopy was utilized by the requesting physician.  No radiographic interpretation.   DG C-Arm 1-60 Min-No Report Result Date: 07/10/2024 Fluoroscopy was utilized by the requesting physician.  No radiographic interpretation.    ECHOCARDIOGRAM COMPLETE Result Date: 06/27/2024    ECHOCARDIOGRAM REPORT   Patient Name:   Donna Guerra Date of Exam: 06/27/2024 Medical Rec #:  [id number]       Height:       64.4 in Accession #:    [id number]      Weight:       126.6 lb Date of Birth:  [birth year]       BSA:          1.618 m Patient Age:    73 years        BP:           123/73 mmHg Patient Gender: F               HR:           73 bpm. Exam Location:  [address] Procedure: 2D Echo, Cardiac Doppler and Color Doppler (Both Spectral and Color            Flow Doppler were utilized during procedure). Indications:    Paroxysmal Atrial Fibrillation I48.0  History:        Patient has no prior history of Echocardiogram examinations.                 Arrythmias:Paoxysmal Atrial Fibrillation; Risk                 Factors:Hyperlipidemia.  Sonographer:    Cherene Ravens RDCS Referring Phys: [id number] MARK C SKAINS IMPRESSIONS  1. Left ventricular ejection fraction, by estimation, is 60 to 65%. The left ventricle has normal function. The left ventricle has no regional wall motion abnormalities. Left ventricular diastolic parameters  are consistent with Grade I diastolic dysfunction (impaired relaxation).  2. Right ventricular systolic function is normal. The right ventricular size is normal. There is normal pulmonary artery systolic pressure. The estimated right ventricular systolic pressure is 23.2 mmHg.  3. The mitral valve is abnormal. Mild mitral valve regurgitation.  4. The aortic valve is tricuspid. Aortic valve regurgitation is not visualized.  5. The inferior vena cava is normal in size with greater than 50% respiratory variability, suggesting right atrial pressure of 3 mmHg. Comparison(s): No prior Echocardiogram. FINDINGS  Left Ventricle: Left ventricular ejection fraction, by estimation, is 60 to 65%. The left ventricle has normal function. The left ventricle has no regional wall motion abnormalities. The left ventricular internal cavity size was normal  in size. There is  no left ventricular hypertrophy. Left ventricular diastolic parameters are consistent with Grade I diastolic dysfunction (impaired relaxation). Indeterminate filling pressures. Right Ventricle: The right ventricular size is normal. No increase in right ventricular wall thickness. Right ventricular systolic function is normal. There is normal pulmonary artery systolic pressure. The tricuspid regurgitant velocity is 2.25 m/s, and  with an assumed right atrial pressure of 3 mmHg, the estimated right ventricular systolic pressure is 23.2 mmHg. Left Atrium: Left atrial size was normal in size. Right Atrium: Right atrial size was normal in size. Pericardium: There is no evidence of pericardial effusion. Mitral Valve: The mitral valve is abnormal. There is mild calcification of the anterior mitral valve leaflet(s). Mild mitral valve regurgitation. Tricuspid Valve: The tricuspid valve is grossly normal. Tricuspid valve regurgitation is trivial. Aortic Valve: The aortic valve is tricuspid. Aortic valve regurgitation is not visualized. Aortic valve mean gradient measures 4.0 mmHg. Aortic valve peak gradient measures 7.6 mmHg. Aortic valve area, by VTI measures 1.74 cm. Pulmonic Valve: The pulmonic valve was normal in structure. Pulmonic valve regurgitation is not visualized. Aorta: The aortic root and ascending aorta are structurally normal, with no evidence of dilitation. Venous: The inferior vena cava is normal in size with greater than 50% respiratory variability, suggesting right atrial pressure of 3 mmHg. IAS/Shunts: No atrial level shunt detected by color flow Doppler.  LEFT VENTRICLE PLAX 2D LVIDd:         3.90 cm   Diastology LVIDs:         2.70 cm   LV e' medial:    9.36 cm/s LV PW:         0.90 cm   LV E/e' medial:  8.7 LV IVS:        0.90 cm   LV e' lateral:   8.81 cm/s LVOT diam:     1.70 cm   LV E/e' lateral: 9.3 LV SV:         49 LV SV Index:   31 LVOT Area:     2.27 cm LV IVRT:       109  msec  RIGHT VENTRICLE RV Basal diam:  2.70 cm RV Mid diam:    2.20 cm RV S prime:     13.70 cm/s TAPSE (M-mode): 2.0 cm RVSP:           23.2 mmHg LEFT ATRIUM             Index        RIGHT ATRIUM           Index LA diam:        3.60 cm 2.22 cm/m   RA Pressure: 3.00 mmHg LA Vol (A2C):   36.4 ml 22.50 ml/m  RA Area:     10.60 cm LA Vol (A4C):   38.2 ml 23.61 ml/m  RA Volume:   22.60 ml  13.97 ml/m LA Biplane Vol: 38.1 ml 23.55 ml/m  AORTIC VALVE AV Area (Vmax):    1.76 cm AV Area (Vmean):   1.68 cm AV Area (VTI):     1.74 cm AV Vmax:           138.00 cm/s AV Vmean:          95.500 cm/s AV VTI:            0.284 m AV Peak Grad:      7.6 mmHg AV Mean Grad:      4.0 mmHg LVOT Vmax:         107.00 cm/s LVOT Vmean:        70.600 cm/s LVOT VTI:          0.218 m LVOT/AV VTI ratio: 0.77  AORTA Ao Root diam: 2.40 cm Ao Asc diam:  2.40 cm MITRAL VALVE                TRICUSPID VALVE MV Area (PHT): 3.48 cm     TR Peak grad:   20.2 mmHg MV Decel Time: 218 msec     TR Vmax:        225.00 cm/s MV E velocity: 81.80 cm/s   Estimated RAP:  3.00 mmHg MV A velocity: 101.00 cm/s  RVSP:           23.2 mmHg MV E/A ratio:  0.81                             SHUNTS                             Systemic VTI:  0.22 m                             Systemic Diam: 1.70 cm Vinie Maxcy MD Electronically signed by Vinie Maxcy MD Signature Date/Time: 06/27/2024/4:52:56 PM    Final     Antibiotics:  Anti-infectives (From admission, onward)    Start     Dose/Rate Route Frequency Ordered Stop   07/10/24 2000  ceFAZolin  (ANCEF ) IVPB 2g/100 mL premix        2 g 200 mL/hr over 30 Minutes Intravenous Every 8 hours 07/10/24 1640 07/11/24 0508   07/10/24 1000  ceFAZolin  (ANCEF ) IVPB 2g/100 mL premix        2 g 200 mL/hr over 30 Minutes Intravenous On call to O.R. 07/10/24 0946 07/10/24 1206   07/10/24 0947  ceFAZolin  (ANCEF ) 2-4 GM/100ML-% IVPB       Note to Pharmacy: Effie Rily O: cabinet override      07/10/24 0947 07/10/24 1213        Discharge Exam: Blood pressure 133/73, pulse 66, temperature 98.6 F (37 C), resp. rate 16, height 5' 3 (1.6 m), weight 56.4 kg, SpO2 100%. Neurologic: Grossly normal Ambulating and voiding well incision cdi   Discharge Medications:   Allergies as of 07/11/2024   No Known Allergies      Medication List     STOP taking these medications    apixaban  5 MG Tabs tablet Commonly known as: ELIQUIS        TAKE these medications    Aimovig  70 MG/ML Soaj  Generic drug: Erenumab -aooe 70 mg sq qmo   atorvastatin  40 MG tablet Commonly known as: LIPITOR Take 1 tablet (40 mg total) by mouth daily.   cholecalciferol  25 MCG (1000 UNIT) tablet Commonly known as: VITAMIN D3 Take 1,000 Units by mouth daily.   cyclobenzaprine  10 MG tablet Commonly known as: FLEXERIL  Take 1 tablet (10 mg total) by mouth 3 (three) times daily as needed for muscle spasms.   diltiazem  30 MG tablet Commonly known as: CARDIZEM  Take 1 tablet (30 mg total) by mouth every 4 (four) hours as needed. For rapid heart rate   fish oil-omega-3 fatty acids  1000 MG capsule Take 1 g by mouth daily.   HYDROcodone -acetaminophen  5-325 MG tablet Commonly known as: NORCO/VICODIN Take 1 tablet by mouth every 4 (four) hours as needed for moderate pain (pain score 4-6).   naphazoline-glycerin  Soln Commonly known as: CLEAR EYES REDNESS Place 1-2 drops into both eyes daily as needed for eye irritation.   NONFORMULARY OR COMPOUNDED ITEM Estradiol  vaginal cream 0.02% 1 ml prefilled syringe, apply 1 ml per vagina twice weekly.  Disp:  28, RF 12.  [facility].   Retin-A  0.1 % cream Generic drug: tretinoin  Apply 1 application  topically 2 (two) times a week.   SUMAtriptan  100 MG tablet Commonly known as: IMITREX  TAKE 1 TABLET BY MOUTH. MAY REPEAT IN 2 HOURS IF HEADACHE PERSISTS OR RECURS APPOINTMENT NEEDED FOR FURTHER REFILLS        Disposition: home   Final Dx: acdf C3-4, 4-5, 5-6  Discharge  Instructions      Remove dressing in 72 hours   Complete by: As directed    Call MD for:   Complete by: As directed    Call MD for:  difficulty breathing, headache or visual disturbances   Complete by: As directed    Call MD for:  hives   Complete by: As directed    Call MD for:  persistant dizziness or light-headedness   Complete by: As directed    Call MD for:  persistant nausea and vomiting   Complete by: As directed    Call MD for:  redness, tenderness, or signs of infection (pain, swelling, redness, odor or green/yellow discharge around incision site)   Complete by: As directed    Call MD for:  severe uncontrolled pain   Complete by: As directed    Call MD for:  temperature >100.4   Complete by: As directed    Diet - low sodium heart healthy   Complete by: As directed    Driving Restrictions   Complete by: As directed    No driving for 2 weeks, no riding in the car for 1 week   Increase activity slowly   Complete by: As directed           Signed: Suzen Lacks Hiliary Osorto 07/11/2024, 8:02 AM

## 2024-07-11 NOTE — Evaluation (Addendum)
 Occupational Therapy Evaluation and Discharge Patient Details Name: OLLIE DELANO MRN: [id number] DOB: 1951-08-01 Today's Date: 07/11/2024   History of Present Illness   Pt is a 73 y/o female presenting on 9/24 for ACDF C3-4, C4-5, and C5-6. PMH includes: aflutter, back pain, osteopenia, skin cancer.     Clinical Impressions Patient admitted for above and presents with problem list below.  PTA pt was independent. Patient was educated on cervical precautions, ADL compensatory techniques, AE/DME, mobility progression, safety and recommendations.  Today, pt demonstrated ability to complete bed mobility with modified independence, transfers with independence, functional mobility with independence, and ADLs with modified independence after education of compensatory techniques and AE.  At discharge, pt will have support from spouse.  Based on performance today, no further OT needs have been identified.  OT will sign off.  Thank you for this referral!        If plan is discharge home, recommend the following:   Assistance with cooking/housework;Assist for transportation     Functional Status Assessment         Equipment Recommendations   None recommended by OT     Recommendations for Other Services         Precautions/Restrictions   Precautions Precautions: Cervical Precaution Booklet Issued: Yes (comment) Recall of Precautions/Restrictions: Intact Required Braces or Orthoses: Cervical Brace Cervical Brace: Soft collar Restrictions Weight Bearing Restrictions Per Provider Order: No     Mobility Bed Mobility Overal bed mobility: Modified Independent             General bed mobility comments: educated on log roll technique    Transfers Overall transfer level: Independent                        Balance Overall balance assessment: Independent                                         ADL either performed or assessed with  clinical judgement   ADL Overall ADL's : Modified independent                                       General ADL Comments: after education of compensatory techniques due to cervical precautions. discussed use of long sponge in shower     Vision   Vision Assessment?: No apparent visual deficits     Perception         Praxis         Pertinent Vitals/Pain Pain Assessment Pain Assessment: No/denies pain     Extremity/Trunk Assessment Upper Extremity Assessment Upper Extremity Assessment: Overall WFL for tasks assessed (within cervical precautions)   Lower Extremity Assessment Lower Extremity Assessment: Defer to PT evaluation   Cervical / Trunk Assessment Cervical / Trunk Assessment: Normal   Communication Communication Communication: No apparent difficulties   Cognition Arousal: Alert Behavior During Therapy: WFL for tasks assessed/performed Cognition: No apparent impairments                               Following commands: Intact       Cueing  General Comments   Cueing Techniques: Verbal cues      Exercises     Shoulder Instructions  Home Living Family/patient expects to be discharged to:: Private residence Living Arrangements: Spouse/significant other Available Help at Discharge: Family Type of Home: House Home Access: Stairs to enter Secretary/administrator of Steps: 3 Entrance Stairs-Rails:  (+ rail) Home Layout: Able to live on main level with bedroom/bathroom     Bathroom Shower/Tub: Producer, television/film/video: Standard     Home Equipment: Agricultural consultant (2 wheels);Shower seat - built in          Prior Functioning/Environment Prior Level of Function : Independent/Modified Independent                    OT Problem List: Decreased knowledge of precautions;Decreased knowledge of use of DME or AE   OT Treatment/Interventions:        OT Goals(Current goals can be found in the care plan  section)   Acute Rehab OT Goals Patient Stated Goal: home OT Goal Formulation: With patient Time For Goal Achievement: 07/25/24 Potential to Achieve Goals: Good   OT Frequency:       Co-evaluation              AM-PAC OT 6 Clicks Daily Activity     Outcome Measure Help from another person eating meals?: None Help from another person taking care of personal grooming?: None Help from another person toileting, which includes using toliet, bedpan, or urinal?: None Help from another person bathing (including washing, rinsing, drying)?: None Help from another person to put on and taking off regular upper body clothing?: None Help from another person to put on and taking off regular lower body clothing?: None 6 Click Score: 24   End of Session Equipment Utilized During Treatment: Cervical collar Nurse Communication: Mobility status  Activity Tolerance: Patient tolerated treatment well Patient left: with call bell/phone within reach;Other (comment) (EOB)  OT Visit Diagnosis: Other abnormalities of gait and mobility (R26.89)                Time: 9099-9078 OT Time Calculation (min): 21 min Charges:  OT General Charges $OT Visit: 1 Visit OT Evaluation $OT Eval Low Complexity: 1 Low  Etta NOVAK, OT Acute Rehabilitation Services Office [phone] Secure Chat Preferred    Etta GORMAN Hope 07/11/2024, 9:48 AM

## 2024-07-12 ENCOUNTER — Encounter (HOSPITAL_COMMUNITY): Payer: Self-pay | Admitting: Neurosurgery

## 2024-07-25 ENCOUNTER — Encounter: Payer: Self-pay | Admitting: Family Medicine

## 2024-08-12 ENCOUNTER — Ambulatory Visit: Payer: Self-pay

## 2024-08-12 ENCOUNTER — Telehealth: Payer: Self-pay | Admitting: *Deleted

## 2024-08-12 ENCOUNTER — Other Ambulatory Visit: Payer: Self-pay | Admitting: Family

## 2024-08-12 DIAGNOSIS — G43809 Other migraine, not intractable, without status migrainosus: Secondary | ICD-10-CM

## 2024-08-12 MED ORDER — SUMATRIPTAN SUCCINATE 100 MG PO TABS: ORAL_TABLET | ORAL | 11 refills | Status: AC

## 2024-08-12 NOTE — Telephone Encounter (Signed)
 Patient called in stating  that insurance will not refill migraine medication until Nov 5th. She would like to know is there anything else that she take or be prescribed in the meantime?Patient stated that she will be going for a dry needling on Wednesday to help with the tight muscles in her neck.

## 2024-08-12 NOTE — Telephone Encounter (Signed)
 FYI Only or Action Required?: Action required by provider: Medication Request.  Patient was last seen in primary care on 07/08/2024 by Domenica Harlene LABOR, MD.  Called Nurse Triage reporting Migraine.  Symptoms began several weeks ago.  Interventions attempted: Prescription medications: Sumatriptan  .  Symptoms are: gradually worsening.  Triage Disposition: See PCP Within 2 Weeks  Patient/caregiver understands and will follow disposition?: Yes Reason for Disposition  Headache is a chronic symptom (recurrent or ongoing AND present > 4 weeks)  Answer Assessment - Initial Assessment Questions Patient stated had a neck fusion in September 24th, since then been experiencing them basically every day. Have an appointment with Neurosurgery on Thursday for a post op xray. She spoke with neurosurgery office complaining of tight muscles contributing to migraines. Sumatriptan  helps with migraines, due to this new problem patient is running out and cannot refill until 11/5 and its only 9 pills a month. Patient is looking for an early refill / more medication to help with this, patient stated she can run the medication through [facility] to get it paid if refilling early is an insurance issue.SABRA Please advise.   1. LOCATION: Where does it hurt?      All over  2. ONSET: When did the headache start? (e.g., minutes, hours, days)     Headache every day last week  3. PATTERN: Does the pain come and go, or has it been constant since it started?     Constant, Sumatriptan  helps  4. SEVERITY: How bad is the pain? and What does it keep you from doing?  (e.g., Scale 1-10; mild, moderate, or severe)     Severe  5. RECURRENT SYMPTOM: Have you ever had headaches before? If Yes, ask: When was the last time? and What happened that time?      Yes  6. CAUSE: What do you think is causing the headache?     Muscles spasming in neck, triggering migraines.  7. MIGRAINE: Have you been diagnosed with  migraine headaches? If Yes, ask: Is this headache similar?      Yes. Manageable with medication   8. HEAD INJURY: Has there been any recent injury to your head?      Denies  9. OTHER SYMPTOMS: Do you have any other symptoms? (e.g., fever, stiff neck, eye pain, sore throat, cold symptoms)     Neck tightness, muscle spasms.  Protocols used: [facility]  Copied from CRM [id number]. Topic: Clinical - Red Word Triage >> Aug 12, 2024  4:43 PM Tysheama G wrote: Red Word that prompted transfer to Nurse Triage: Patient is experiencing extreme migraines due to neck muscle spasms

## 2024-08-12 NOTE — Telephone Encounter (Signed)
 Copied from CRM [id number]. Topic: Clinical - Medication Question >> Aug 12, 2024  9:27 AM Emylou G wrote: Reason for CRM: Had surgery 9/24.. but still have migraines ( she is being sent to needling ) she needs alternative to her SUMAtriptan  (IMITREX ) 100 MG tablet which she only has 1 more pill left.  Would like something for her headaches - says the triptan meds really work.

## 2024-08-13 ENCOUNTER — Other Ambulatory Visit: Payer: Self-pay | Admitting: Family Medicine

## 2024-08-13 ENCOUNTER — Telehealth: Payer: Self-pay | Admitting: *Deleted

## 2024-08-13 ENCOUNTER — Ambulatory Visit: Payer: Self-pay

## 2024-08-13 ENCOUNTER — Telehealth: Payer: Self-pay | Admitting: Family Medicine

## 2024-08-13 MED ORDER — RIZATRIPTAN BENZOATE 10 MG PO TABS: 10.0000 mg | ORAL_TABLET | ORAL | 0 refills | Status: DC | PRN

## 2024-08-13 NOTE — Telephone Encounter (Signed)
 Pt insurance covers only 9 tabs per 30 days.  I will try and do a prior auth but it will take time.  She states that toradol  does not work and the only thing that works is the imitrex , however it comes right back.  She also takes aimovig .  She would like to know if something else can be called in similar to the imitrex .

## 2024-08-13 NOTE — Telephone Encounter (Signed)
 See previous message

## 2024-08-13 NOTE — Telephone Encounter (Signed)
 Copied from CRM [id number]. Topic: General - Call Back - No Documentation >> Aug 13, 2024 10:40 AM Rea BROCKS wrote: Reason for CRM: Patient called back to speak with [facility] and was advised to call front desk when she calls back. Called Keane Daniels will call patient back.

## 2024-08-13 NOTE — Telephone Encounter (Signed)
 Prior auth started via cover my meds.  Awaiting determination.  Key: Donna Guerra

## 2024-08-13 NOTE — Telephone Encounter (Signed)
 FYI Only or Action Required?: Action required by provider: clinical question for provider.  Patient was last seen in primary care on 07/08/2024 by Domenica Harlene LABOR, MD.  Called Nurse Triage reporting Medication Problem.  Triage Disposition: Call PCP When Office is Open  Patient/caregiver understands and will follow disposition?: Yes     Copied from CRM #[id number]. Topic: Clinical - Red Word Triage >> Aug 13, 2024  8:29 AM Donna Guerra wrote: Red Word that prompted transfer to Nurse Triage: Patient called in needing someone to Seriously  give a call. Took her last pill SUMAtriptan  (IMITREX ) 100 MG tablet this morning and is still having the migraine stated it's horrible. Has had an headache since 10/24.      Reason for Disposition  [1] Caller has NON-URGENT medicine question about med that PCP prescribed AND [2] triager unable to answer question  Answer Assessment - Initial Assessment Questions Patient is concerned her pharmacy will be unable to fill her prescription early due to her insurance. She is wondering if there is an alternative for the Imitrex  that can be prescribed for her migraines. Please advise.     1. NAME of MEDICINE: What medicine(s) are you calling about?     SUMAtriptan  (IMITREX ) 100 MG tablet  2. QUESTION: What is your question? (e.g., double dose of medicine, side effect)     Is concerned she will not be able to get the prescription filled early 3. PRESCRIBER: Who prescribed the medicine? Reason: if prescribed by specialist, call should be referred to that group.     Dr. Domenica  Protocols used: Medication Question Call-A-AH

## 2024-08-13 NOTE — Telephone Encounter (Signed)
 Spoke with pt

## 2024-08-14 NOTE — Telephone Encounter (Signed)
 Pt notified yesterday and she did pick the Maxalt up.  She stated that her insurance will actually cover 18 per 30 days.

## 2024-09-03 ENCOUNTER — Other Ambulatory Visit: Payer: Self-pay | Admitting: Family Medicine

## 2024-09-03 DIAGNOSIS — G43711 Chronic migraine without aura, intractable, with status migrainosus: Secondary | ICD-10-CM

## 2024-09-03 NOTE — Telephone Encounter (Unsigned)
 Copied from CRM [id number]. Topic: Clinical - Medication Refill >> Sep 03, 2024 12:55 PM Mesmerise C wrote: Medication:  rizatriptan (MAXALT) 10 MG tablet  Has the patient contacted their pharmacy? No (Agent: If no, request that the patient contact the pharmacy for the refill. If patient does not wish to contact the pharmacy document the reason why and proceed with request.) (Agent: If yes, when and what did the pharmacy advise?) No refills This is the patient's preferred pharmacy:  [facility] [address] [place] - [address] [place] [address] Phone: [phone] Fax: [phone]  Is this the correct pharmacy for this prescription? Yes If no, delete pharmacy and type the correct one.   Has the prescription been filled recently? No  Is the patient out of the medication? Yes  Has the patient been seen for an appointment in the last year OR does the patient have an upcoming appointment? Yes  Can we respond through MyChart? Yes  Agent: Please be advised that Rx refills may take up to 3 business days. We ask that you follow-up with your pharmacy.

## 2024-09-04 MED ORDER — RIZATRIPTAN BENZOATE 10 MG PO TABS: 10.0000 mg | ORAL_TABLET | ORAL | 0 refills | Status: DC | PRN

## 2024-09-26 ENCOUNTER — Telehealth: Payer: Self-pay | Admitting: Family Medicine

## 2024-09-26 NOTE — Telephone Encounter (Unsigned)
 Copied from CRM #[id number]. Topic: Clinical - Medication Refill >> Sep 26, 2024  1:29 PM Alfonso HERO wrote: Medication: rizatriptan  (MAXALT ) 10 MG tablet  Has the patient contacted their pharmacy? No (Agent: If no, request that the patient contact the pharmacy for the refill. If patient does not wish to contact the pharmacy document the reason why and proceed with request.) (Agent: If yes, when and what did the pharmacy advise?)  This is the patient's preferred pharmacy:  [facility] [address] [place] - [address]  [place] [address] Phone: [phone] Fax: [phone]  Is this the correct pharmacy for this prescription? Yes If no, delete pharmacy and type the correct one.   Has the prescription been filled recently? Yes  Is the patient out of the medication? Yes  Has the patient been seen for an appointment in the last year OR does the patient have an upcoming appointment? Yes  Can we respond through MyChart? Yes  Agent: Please be advised that Rx refills may take up to 3 business days. We ask that you follow-up with your pharmacy.

## 2024-09-26 NOTE — Progress Notes (Unsigned)
 Electrophysiology Office Note:   Date:  09/27/2024  ID:  Donna Guerra, DOB [birth year], MRN [id number]  Primary Cardiologist: None Electrophysiologist: Fonda Kitty, MD      History of Present Illness:   Donna Guerra is a 73 y.o. female with h/o hyperlipidemia, atrial flutter and atrial fibrillation who is being seen today for evaluation of her atrial arrhythmias at the request of Dr. Jeffrie.  Discussed the use of AI scribe software for clinical note transcription with the patient, who gave verbal consent to proceed.  History of Present Illness Donna Guerra is a 73 year old female with atrial fibrillation who presents for evaluation of her heart rhythm management and headache issues.  She has a history of atrial fibrillation and is currently on a blood thinner, which she wants to discontinue due to concerns about long-term use. She previously experienced heart racing, skipping, and fluttering sensations but has not felt these symptoms recently. She takes diltiazem  as needed for episodes of irregular heart rate. Her EKGs have shown extra heartbeats, and she is concerned about the potential for her heart rhythm to become irregular again.  She reports persistent headaches despite undergoing surgery, dry needling, massages, and physical therapy. She has been keeping a diary for three months, noting approximately 19 headaches per month, which is similar to her pre-surgery frequency. She is now being referred to a neurologist specializing in headaches.  In the past, she has experienced internal body shaking, particularly during the summer months, but these episodes have been intermittent.  No recent episodes of heart fluttering or high pulse rate.   Review of systems complete and found to be negative unless listed in HPI.   EP Information / Studies Reviewed:    EKG is not ordered today. EKG from 06/25/24 reviewed which showed SR with PACs     ECG 04/28/24:    [facility] 04/2024: NSR with  sinus brady (56/min) and sinus tachycardia, ave HR 89/min. 4% atrial flutter with a RVR. Frequent episodes of NS SVT, longest 19 beats at 184/min. No prolonged pauses. Frequent PAC's (19%). Symptoms associated with atrial flutter and SVT.    Risk Assessment/Calculations:    CHA2DS2-VASc Score = 3   This indicates a 3.2% annual risk of stroke. The patient's score is based upon: CHF History: 0 HTN History: 0 Diabetes History: 0 Stroke History: 0 Vascular Disease History: 0 Age Score: 2 Gender Score: 1     Physical Exam:   VS:  BP 130/74 (BP Location: Right Arm, Patient Position: Sitting, Cuff Size: Normal)   Pulse 72   Ht 5' 3 (1.6 m)   Wt 124 lb 11.2 oz (56.6 kg)   SpO2 98%   BMI 22.09 kg/m    Wt Readings from Last 3 Encounters:  09/27/24 124 lb 11.2 oz (56.6 kg)  07/10/24 124 lb 6.4 oz (56.4 kg)  07/08/24 124 lb 6.4 oz (56.4 kg)     GEN: Well nourished, well developed in no acute distress NECK: No JVD CARDIAC: Normal rate, irregular rhythm RESPIRATORY:  Clear to auscultation without rales, wheezing or rhonchi  ABDOMEN: Soft, non-distended EXTREMITIES:  No edema; No deformity   ASSESSMENT AND PLAN:    #Atrial flutter: #Paroxysmal atrial fibrillation: #PACs/AT -She is quite symptomatic. I suspect she has bursts of pulmonary vein atrial tachycardia that at times trigger typical atrial flutter and/or paroxysms of atrial fibrillation.   -Discussed treatment options today for AFL including antiarrhythmic drug therapy and ablation. Discussed risks, recovery and likelihood of success  with each treatment strategy. Risk, benefits, and alternatives to EP study and ablation for AFL were discussed. These risks include but are not limited to stroke, bleeding, vascular damage, tamponade, perforation, damage to the esophagus, lungs, phrenic nerve and other structures, pulmonary vein stenosis, worsening renal function, coronary vasospasm and death.  Discussed potential need for  repeat ablation procedures and antiarrhythmic drugs after an initial ablation. The patient understands these risk and wishes to proceed.  We will therefore proceed with catheter ablation at the next available time.  Carto, ICE, anesthesia are requested for the procedure.  We will perform full EP study in efforts to induce AT or AFL or AF at time of ablation. -We will obtain CT PV protocol prior to the procedure. - Continue diltiazem  as needed.  #Hypercoagulable state due to AF/AFL: No bleeding issues.  - Continue Eliquis  5mg  BID for now. Patient wants to come off anti-coagulation long-term.  Given history of multiple atrial arrhythmias - AT, SVT, AFL - we will perform loop recorder implantation at time of ablation to assist in diagnosis and monitoring of arrhythmias moving forward.  If there is no recurrence of AF or AFL post ablation, then patient can stop anticoagulation.  Follow up with Dr. Kennyth 3 months after procedure.   Signed, Fonda Kennyth, MD

## 2024-09-27 ENCOUNTER — Encounter: Payer: Self-pay | Admitting: Cardiology

## 2024-09-27 ENCOUNTER — Other Ambulatory Visit: Payer: Self-pay | Admitting: Family Medicine

## 2024-09-27 ENCOUNTER — Ambulatory Visit: Attending: Cardiology | Admitting: Cardiology

## 2024-09-27 VITALS — BP 130/74 | HR 72 | Ht 63.0 in | Wt 124.7 lb

## 2024-09-27 DIAGNOSIS — I483 Typical atrial flutter: Secondary | ICD-10-CM | POA: Diagnosis not present

## 2024-09-27 DIAGNOSIS — I4719 Other supraventricular tachycardia: Secondary | ICD-10-CM | POA: Diagnosis not present

## 2024-09-27 DIAGNOSIS — I480 Paroxysmal atrial fibrillation: Secondary | ICD-10-CM

## 2024-09-27 DIAGNOSIS — I4892 Unspecified atrial flutter: Secondary | ICD-10-CM | POA: Diagnosis not present

## 2024-09-27 DIAGNOSIS — I491 Atrial premature depolarization: Secondary | ICD-10-CM

## 2024-09-27 DIAGNOSIS — D6869 Other thrombophilia: Secondary | ICD-10-CM

## 2024-09-27 DIAGNOSIS — G43711 Chronic migraine without aura, intractable, with status migrainosus: Secondary | ICD-10-CM

## 2024-09-27 MED ORDER — RIZATRIPTAN BENZOATE 10 MG PO TABS: 10.0000 mg | ORAL_TABLET | ORAL | 1 refills | Status: AC | PRN

## 2024-09-27 NOTE — Patient Instructions (Signed)
 Medication Instructions:  Your physician recommends that you continue on your current medications as directed. Please refer to the Current Medication list given to you today.  *If you need a refill on your cardiac medications before your next appointment, please call your pharmacy*  Testing/Procedures: Ablation Your physician has recommended that you have an ablation. Catheter ablation is a medical procedure used to treat some cardiac arrhythmias (irregular heartbeats). During catheter ablation, a long, thin, flexible tube is put into a blood vessel in your groin (upper thigh), or neck. This tube is called an ablation catheter. It is then guided to your heart through the blood vessel. Radio frequency waves destroy small areas of heart tissue where abnormal heartbeats may cause an arrhythmia to start.   You are scheduled for Atrial Fibrillation/Flutter Ablation on Wednesday, February 11 with Dr. Dr. Kennyth. Please arrive at the Main Entrance A at [facility]: [address] [place], [place] [address] at 8:30 AM   What To Expect:  Labs: you will need to have lab work drawn within 30 days of your procedure. Please go to any [facility] location to have these drawn - no appointment is needed. Cardiac CT Scan: this will be done about 3-4 weeks prior to your procedure. You will be contacted to schedule this test. You will receive procedure instructions either through MyChart or in the mail 4-6 week prior to your procedure.  After your procedure we recommend no driving for 4 days, no lifting over 5 lbs for 7 days, and no work or strenuous activity for 7 days.  Please contact our office at [phone] if you have any questions.    Follow-Up: We will contact you to schedule your post-procedure appointments.

## 2024-10-25 ENCOUNTER — Telehealth: Payer: Self-pay

## 2024-10-25 ENCOUNTER — Other Ambulatory Visit: Payer: Self-pay

## 2024-10-25 DIAGNOSIS — I480 Paroxysmal atrial fibrillation: Secondary | ICD-10-CM

## 2024-10-25 NOTE — Telephone Encounter (Signed)
-----   Message from Nurse Doreatha BROCKS, RN sent at 09/27/2024  1:45 PM EST ----- Regarding: 11/27/24 afib/flutter, loop implant  Precert:  MD: Kennyth Type of ablation: A-fib/flutter Diagnosis: afib/flutter CPT code: A-fib (06343). [id number], [id number] Ablation scheduled (date/time): 11/27/24 at 10:30am  Procedure:  Added to calendar? Yes Orders entered? Yes Letter complete? No, >30 days before procedure Scheduled with cath lab? Yes Any medications to hold? No Labs ordered (CBC, BMET, PT/INR if on warfarin): Yes Mapping system: Doesn't matter CARTO/OPAL rep notified? No Cardiac CT needed? Yes, ordered and letter given  Dye allergy? No Pre-meds ordered and instructions given? No, not needed Letter method: MyChart H&P: 12/12 Device: No  Follow-up:  Cassie/Angel, please schedule Routine.  Covering RN - please send this message to [employer], EP scheduler, EP Scheduling pool, EP [employer], and CT scheduler (Brittany Lynch/Stephanie Mogg), if indicated.

## 2024-10-31 ENCOUNTER — Encounter (HOSPITAL_COMMUNITY): Payer: Self-pay

## 2024-10-31 ENCOUNTER — Telehealth (HOSPITAL_COMMUNITY): Payer: Self-pay

## 2024-10-31 NOTE — Telephone Encounter (Signed)
 Attempted to reach patient to discuss upcoming procedure, no answer. Left VM for patient to return call.

## 2024-11-01 NOTE — Telephone Encounter (Addendum)
 Patient returned call to discuss upcoming procedure.   CT and labs: sched 1/21   Any recent signs of acute illness or been started on antibiotics? No Any new medications started?No  Any missed doses of blood thinner?  No  Advised patient to continue taking Eliquis  (Apixaban ) twice daily without missing any doses. Any medications to hold?  routine Medication instructions:  On the morning of your procedure DO NOT take any medication., including Eliquis  (Apixaban ) or the procedure may be rescheduled. Nothing to eat or drink after midnight prior to your procedure.  Confirmed patient is scheduled for Atrial Fibrillation/Flutter Ablation, Loop Recorder Insertion on Wednesday, February 11 with Dr. Kennyth. Instructed patient to arrive at the Main Entrance A at [facility]: [address] [place], [place] [address] and check in at Admitting at 8:30 AM.   Plan to go home the same day, you will only stay overnight if medically necessary. You MUST have a responsible adult to drive you home and MUST be with you the first 24 hours after you arrive home or your procedure could be cancelled.  Informed patient a nurse will call a day before the procedure to confirm arrival time and ensure instructions are followed.  Patient verbalized understanding to all instructions provided and agreed to proceed with procedure.   Advised patient to contact RN Navigator at [phone], to inform of any new medications started after call or concerns prior to procedure.

## 2024-11-06 ENCOUNTER — Telehealth: Payer: Self-pay | Admitting: Family Medicine

## 2024-11-06 ENCOUNTER — Ambulatory Visit (HOSPITAL_COMMUNITY)
Admission: RE | Admit: 2024-11-06 | Discharge: 2024-11-06 | Disposition: A | Discharge status: Home / Self Care | Source: Ambulatory Visit | Attending: Cardiology | Admitting: Cardiology

## 2024-11-06 DIAGNOSIS — I480 Paroxysmal atrial fibrillation: Secondary | ICD-10-CM | POA: Diagnosis not present

## 2024-11-06 MED ORDER — IOHEXOL 350 MG/ML SOLN
100.0000 mL | Freq: Once | INTRAVENOUS | Status: AC | PRN
  Administered 2024-11-06: 100 mL via INTRAVENOUS

## 2024-11-06 NOTE — Telephone Encounter (Signed)
 Pt scheduled

## 2024-11-06 NOTE — Telephone Encounter (Signed)
 Copied from CRM #[id number]. Topic: Appointments - Scheduling Inquiry for Clinic >> Nov 06, 2024  3:46 PM Frederich PARAS wrote: Reason for CRM: pt would like to know if she can become a pt for dr. Amon, she has seen himn the past ans she would like to be a patient of his due to stacy blyth leaving

## 2024-11-06 NOTE — Telephone Encounter (Signed)
 Okay to schedule toc w/ Paz.

## 2024-11-06 NOTE — Telephone Encounter (Signed)
That is okay, arrange a visit at her convenience

## 2024-11-07 LAB — CBC
Hematocrit: 45.6 % (ref 34.0–46.6)
Hemoglobin: 15.5 g/dL (ref 11.1–15.9)
MCH: 33.1 pg — ABNORMAL HIGH (ref 26.6–33.0)
MCHC: 34 g/dL (ref 31.5–35.7)
MCV: 97 fL (ref 79–97)
Platelets: 319 x10E3/uL (ref 150–450)
RBC: 4.68 x10E6/uL (ref 3.77–5.28)
RDW: 12.2 % (ref 11.7–15.4)
WBC: 12.9 x10E3/uL — ABNORMAL HIGH (ref 3.4–10.8)

## 2024-11-07 LAB — BASIC METABOLIC PANEL WITH GFR
BUN/Creatinine Ratio: 26 (ref 12–28)
BUN: 19 mg/dL (ref 8–27)
CO2: 21 mmol/L (ref 20–29)
Calcium: 10.1 mg/dL (ref 8.7–10.3)
Chloride: 98 mmol/L (ref 96–106)
Creatinine, Ser: 0.72 mg/dL (ref 0.57–1.00)
Glucose: 90 mg/dL (ref 70–99)
Potassium: 4.3 mmol/L (ref 3.5–5.2)
Sodium: 136 mmol/L (ref 134–144)
eGFR: 87 mL/min/1.73

## 2024-11-10 ENCOUNTER — Ambulatory Visit: Payer: Self-pay | Admitting: Cardiology

## 2024-11-11 NOTE — Telephone Encounter (Signed)
 CBC order placed for day of procedure.

## 2024-11-27 ENCOUNTER — Encounter (HOSPITAL_COMMUNITY): Admission: RE | Payer: Self-pay | Source: Home / Self Care

## 2024-11-27 ENCOUNTER — Ambulatory Visit (HOSPITAL_COMMUNITY): Admission: RE | Admit: 2024-11-27 | Admitting: Cardiology

## 2024-11-27 DIAGNOSIS — D72829 Elevated white blood cell count, unspecified: Secondary | ICD-10-CM

## 2024-11-27 DIAGNOSIS — I480 Paroxysmal atrial fibrillation: Secondary | ICD-10-CM

## 2024-11-27 SURGERY — ATRIAL FIBRILLATION ABLATION
Anesthesia: General

## 2025-01-06 ENCOUNTER — Ambulatory Visit: Admitting: Family Medicine

## 2025-04-16 ENCOUNTER — Ambulatory Visit: Admitting: Obstetrics and Gynecology

## 2025-07-10 ENCOUNTER — Encounter: Admitting: Family Medicine

## 2025-07-11 ENCOUNTER — Encounter: Admitting: Internal Medicine
